# Patient Record
Sex: Female | Born: 1935 | Race: White | Hispanic: No | Smoking: Never smoker
Health system: Southern US, Community
[De-identification: ages and names within clinical notes are randomized; demographics above are authoritative.]

## PROBLEM LIST (undated history)

## (undated) DIAGNOSIS — H5440 Blindness, one eye, unspecified eye: Secondary | ICD-10-CM

## (undated) DIAGNOSIS — T4145XA Adverse effect of unspecified anesthetic, initial encounter: Secondary | ICD-10-CM

## (undated) DIAGNOSIS — D689 Coagulation defect, unspecified: Secondary | ICD-10-CM

## (undated) DIAGNOSIS — K5792 Diverticulitis of intestine, part unspecified, without perforation or abscess without bleeding: Secondary | ICD-10-CM

## (undated) DIAGNOSIS — C801 Malignant (primary) neoplasm, unspecified: Secondary | ICD-10-CM

## (undated) DIAGNOSIS — K4090 Unilateral inguinal hernia, without obstruction or gangrene, not specified as recurrent: Secondary | ICD-10-CM

## (undated) DIAGNOSIS — K219 Gastro-esophageal reflux disease without esophagitis: Secondary | ICD-10-CM

## (undated) DIAGNOSIS — T8859XA Other complications of anesthesia, initial encounter: Secondary | ICD-10-CM

## (undated) DIAGNOSIS — Z8719 Personal history of other diseases of the digestive system: Secondary | ICD-10-CM

## (undated) DIAGNOSIS — H269 Unspecified cataract: Secondary | ICD-10-CM

## (undated) DIAGNOSIS — N200 Calculus of kidney: Secondary | ICD-10-CM

## (undated) DIAGNOSIS — E039 Hypothyroidism, unspecified: Secondary | ICD-10-CM

## (undated) DIAGNOSIS — K8020 Calculus of gallbladder without cholecystitis without obstruction: Secondary | ICD-10-CM

## (undated) DIAGNOSIS — I10 Essential (primary) hypertension: Secondary | ICD-10-CM

## (undated) DIAGNOSIS — K5790 Diverticulosis of intestine, part unspecified, without perforation or abscess without bleeding: Secondary | ICD-10-CM

## (undated) DIAGNOSIS — M1990 Unspecified osteoarthritis, unspecified site: Secondary | ICD-10-CM

## (undated) DIAGNOSIS — Z9889 Other specified postprocedural states: Secondary | ICD-10-CM

## (undated) DIAGNOSIS — J45909 Unspecified asthma, uncomplicated: Secondary | ICD-10-CM

## (undated) DIAGNOSIS — D649 Anemia, unspecified: Secondary | ICD-10-CM

## (undated) HISTORY — DX: Calculus of gallbladder without cholecystitis without obstruction: K80.20

## (undated) HISTORY — PX: BREAST BIOPSY: SHX20

## (undated) HISTORY — DX: Diverticulosis of intestine, part unspecified, without perforation or abscess without bleeding: K57.90

## (undated) HISTORY — PX: COLONOSCOPY: SHX174

## (undated) HISTORY — DX: Personal history of other diseases of the digestive system: Z87.19

## (undated) HISTORY — PX: OTHER SURGICAL HISTORY: SHX169

## (undated) HISTORY — DX: Calculus of kidney: N20.0

## (undated) HISTORY — DX: Unilateral inguinal hernia, without obstruction or gangrene, not specified as recurrent: K40.90

## (undated) HISTORY — DX: Diverticulitis of intestine, part unspecified, without perforation or abscess without bleeding: K57.92

## (undated) HISTORY — PX: BUNIONECTOMY: SHX129

## (undated) HISTORY — DX: Other specified postprocedural states: Z98.890

## (undated) HISTORY — DX: Unspecified cataract: H26.9

## (undated) HISTORY — DX: Blindness, one eye, unspecified eye: H54.40

## (undated) HISTORY — PX: TRIGGER FINGER RELEASE: SHX641

## (undated) HISTORY — DX: Coagulation defect, unspecified: D68.9

---

## 1964-03-27 HISTORY — PX: ABDOMINAL HYSTERECTOMY: SHX81

## 1992-03-27 HISTORY — PX: EYE SURGERY: SHX253

## 1994-03-27 HISTORY — PX: CHOLECYSTECTOMY: SHX55

## 2006-03-27 HISTORY — PX: BLADDER SURGERY: SHX569

## 2015-01-26 HISTORY — PX: COLOSTOMY: SHX63

## 2015-01-26 HISTORY — PX: DIAGNOSTIC LAPAROSCOPY: SUR761

## 2015-02-02 ENCOUNTER — Emergency Department (HOSPITAL_COMMUNITY): Payer: Medicare Other

## 2015-02-02 ENCOUNTER — Inpatient Hospital Stay (HOSPITAL_COMMUNITY)
Admission: EM | Admit: 2015-02-02 | Discharge: 2015-02-10 | DRG: 853 | Disposition: A | Discharge status: Skilled Nursing Facility | Payer: Medicare Other | Attending: Internal Medicine | Admitting: Internal Medicine

## 2015-02-02 ENCOUNTER — Encounter (HOSPITAL_COMMUNITY): Payer: Self-pay | Admitting: Emergency Medicine

## 2015-02-02 DIAGNOSIS — R6521 Severe sepsis with septic shock: Secondary | ICD-10-CM | POA: Diagnosis present

## 2015-02-02 DIAGNOSIS — N8110 Cystocele, unspecified: Secondary | ICD-10-CM | POA: Diagnosis present

## 2015-02-02 DIAGNOSIS — Z90710 Acquired absence of both cervix and uterus: Secondary | ICD-10-CM

## 2015-02-02 DIAGNOSIS — Z6828 Body mass index (BMI) 28.0-28.9, adult: Secondary | ICD-10-CM

## 2015-02-02 DIAGNOSIS — J449 Chronic obstructive pulmonary disease, unspecified: Secondary | ICD-10-CM | POA: Diagnosis present

## 2015-02-02 DIAGNOSIS — R0600 Dyspnea, unspecified: Secondary | ICD-10-CM

## 2015-02-02 DIAGNOSIS — R188 Other ascites: Secondary | ICD-10-CM | POA: Diagnosis present

## 2015-02-02 DIAGNOSIS — K5720 Diverticulitis of large intestine with perforation and abscess without bleeding: Secondary | ICD-10-CM | POA: Diagnosis present

## 2015-02-02 DIAGNOSIS — A419 Sepsis, unspecified organism: Secondary | ICD-10-CM | POA: Diagnosis not present

## 2015-02-02 DIAGNOSIS — I10 Essential (primary) hypertension: Secondary | ICD-10-CM | POA: Diagnosis present

## 2015-02-02 DIAGNOSIS — E876 Hypokalemia: Secondary | ICD-10-CM | POA: Diagnosis present

## 2015-02-02 DIAGNOSIS — Z9049 Acquired absence of other specified parts of digestive tract: Secondary | ICD-10-CM

## 2015-02-02 DIAGNOSIS — Z09 Encounter for follow-up examination after completed treatment for conditions other than malignant neoplasm: Secondary | ICD-10-CM

## 2015-02-02 DIAGNOSIS — Z820 Family history of epilepsy and other diseases of the nervous system: Secondary | ICD-10-CM

## 2015-02-02 DIAGNOSIS — Z823 Family history of stroke: Secondary | ICD-10-CM

## 2015-02-02 DIAGNOSIS — Z4659 Encounter for fitting and adjustment of other gastrointestinal appliance and device: Secondary | ICD-10-CM

## 2015-02-02 DIAGNOSIS — E039 Hypothyroidism, unspecified: Secondary | ICD-10-CM | POA: Diagnosis present

## 2015-02-02 DIAGNOSIS — Z5331 Laparoscopic surgical procedure converted to open procedure: Secondary | ICD-10-CM

## 2015-02-02 DIAGNOSIS — N179 Acute kidney failure, unspecified: Secondary | ICD-10-CM | POA: Diagnosis present

## 2015-02-02 DIAGNOSIS — K659 Peritonitis, unspecified: Secondary | ICD-10-CM | POA: Diagnosis present

## 2015-02-02 DIAGNOSIS — R609 Edema, unspecified: Secondary | ICD-10-CM

## 2015-02-02 DIAGNOSIS — J4540 Moderate persistent asthma, uncomplicated: Secondary | ICD-10-CM | POA: Diagnosis present

## 2015-02-02 DIAGNOSIS — K631 Perforation of intestine (nontraumatic): Secondary | ICD-10-CM

## 2015-02-02 DIAGNOSIS — K449 Diaphragmatic hernia without obstruction or gangrene: Secondary | ICD-10-CM | POA: Diagnosis present

## 2015-02-02 DIAGNOSIS — Z85820 Personal history of malignant melanoma of skin: Secondary | ICD-10-CM

## 2015-02-02 DIAGNOSIS — Z79899 Other long term (current) drug therapy: Secondary | ICD-10-CM

## 2015-02-02 DIAGNOSIS — E861 Hypovolemia: Secondary | ICD-10-CM | POA: Diagnosis present

## 2015-02-02 DIAGNOSIS — Z8249 Family history of ischemic heart disease and other diseases of the circulatory system: Secondary | ICD-10-CM

## 2015-02-02 DIAGNOSIS — H540 Blindness, both eyes: Secondary | ICD-10-CM | POA: Diagnosis present

## 2015-02-02 DIAGNOSIS — R109 Unspecified abdominal pain: Secondary | ICD-10-CM | POA: Diagnosis not present

## 2015-02-02 DIAGNOSIS — D62 Acute posthemorrhagic anemia: Secondary | ICD-10-CM | POA: Diagnosis not present

## 2015-02-02 DIAGNOSIS — Z66 Do not resuscitate: Secondary | ICD-10-CM | POA: Diagnosis present

## 2015-02-02 HISTORY — DX: Malignant (primary) neoplasm, unspecified: C80.1

## 2015-02-02 HISTORY — DX: Essential (primary) hypertension: I10

## 2015-02-02 HISTORY — DX: Hypothyroidism, unspecified: E03.9

## 2015-02-02 HISTORY — DX: Unspecified asthma, uncomplicated: J45.909

## 2015-02-02 LAB — COMPREHENSIVE METABOLIC PANEL
ALT: 24 U/L (ref 14–54)
ANION GAP: 9 (ref 5–15)
AST: 18 U/L (ref 15–41)
Albumin: 3 g/dL — ABNORMAL LOW (ref 3.5–5.0)
Alkaline Phosphatase: 72 U/L (ref 38–126)
BILIRUBIN TOTAL: 1.3 mg/dL — AB (ref 0.3–1.2)
BUN: 30 mg/dL — AB (ref 6–20)
CO2: 26 mmol/L (ref 22–32)
Calcium: 9.3 mg/dL (ref 8.9–10.3)
Chloride: 101 mmol/L (ref 101–111)
Creatinine, Ser: 1.1 mg/dL — ABNORMAL HIGH (ref 0.44–1.00)
GFR calc Af Amer: 51 mL/min — ABNORMAL LOW (ref >60)
GFR, EST NON AFRICAN AMERICAN: 44 mL/min — AB (ref >60)
Glucose, Bld: 106 mg/dL — ABNORMAL HIGH (ref 65–99)
POTASSIUM: 4 mmol/L (ref 3.5–5.1)
Sodium: 136 mmol/L (ref 135–145)
TOTAL PROTEIN: 5.5 g/dL — AB (ref 6.5–8.1)

## 2015-02-02 LAB — CBC WITH DIFFERENTIAL/PLATELET
Basophils Absolute: 0 10*3/uL (ref 0.0–0.1)
Basophils Relative: 0 %
Eosinophils Absolute: 0 10*3/uL (ref 0.0–0.7)
Eosinophils Relative: 0 %
HEMATOCRIT: 39.4 % (ref 36.0–46.0)
Hemoglobin: 12.8 g/dL (ref 12.0–15.0)
LYMPHS ABS: 0.8 10*3/uL (ref 0.7–4.0)
LYMPHS PCT: 3 %
MCH: 30.5 pg (ref 26.0–34.0)
MCHC: 32.5 g/dL (ref 30.0–36.0)
MCV: 94 fL (ref 78.0–100.0)
MONO ABS: 0.7 10*3/uL (ref 0.1–1.0)
MONOS PCT: 3 %
NEUTROS ABS: 23 10*3/uL — AB (ref 1.7–7.7)
Neutrophils Relative %: 94 %
Platelets: 219 10*3/uL (ref 150–400)
RBC: 4.19 MIL/uL (ref 3.87–5.11)
RDW: 14.1 % (ref 11.5–15.5)
WBC: 24.5 10*3/uL — ABNORMAL HIGH (ref 4.0–10.5)

## 2015-02-02 LAB — TROPONIN I: Troponin I: <0.03 ng/mL (ref <0.031)

## 2015-02-02 LAB — I-STAT CG4 LACTIC ACID, ED: LACTIC ACID, VENOUS: 2.63 mmol/L — AB (ref 0.5–2.0)

## 2015-02-02 NOTE — ED Provider Notes (Signed)
CSN: [id number]     Arrival date & time 02/02/15  2055 History   First MD Initiated Contact with Patient 02/02/15 2128     Chief Complaint  Patient presents with  . Abdominal Pain   HPI  Ms. Mckoy is an 81 year old female with PMHx of HTN, hypothyroidism and asthma presenting with abdominal pain. She reports that her pain began yesterday evening. The pain came on acutely and has localized to her lower abdomen. She is unable to characterize the pain. She states that the pain comes and goes. Last evening's episode resolved on its own and she had another episode this morning that also resolved. She states this current episode began around mid afternoon and has not resolved like the others have. She states that she thinks she is constipated. She cannot remember her last bowel movement and she has not been able to pass gas. She has some associated mild nausea. She denies vomiting. She was recently put on a prednisone taper for bronchitis and is concerned this is making her constipated. She has not tried any over-the-counter laxatives or stool softeners. She states that Aleve relieved her pain for a short period of time but it quickly came back. Denies fevers, chills, dizziness, headaches, neck pain, chest pain, SOB, dysuria, flank pain or weakness in her extremities.   Past Medical History  Diagnosis Date  . Asthma   . Hypothyroidism   . Hypertension   . Cancer ([place])     Ocular melanoma   Past Surgical History  Procedure Laterality Date  . Eye surgery    . Cholecystectomy    . Foot surgery    . Bladder surgery    . Abdominal hysterectomy     Family History  Problem Relation Age of Onset  . Hypertension Mother   . Alzheimer's disease Mother   . Stroke Father    Social History  Substance Use Topics  . Smoking status: Never Smoker   . Smokeless tobacco: None  . Alcohol Use: No   OB History    No data available     Review of Systems  Constitutional: Positive for appetite change  (decrease). Negative for fever and chills.  HENT: Negative for congestion, rhinorrhea and sore throat.   Eyes: Negative for visual disturbance.  Respiratory: Negative for cough and shortness of breath.   Cardiovascular: Negative for chest pain and palpitations.  Gastrointestinal: Positive for nausea, abdominal pain and constipation. Negative for vomiting, blood in stool and abdominal distention.  Genitourinary: Negative for dysuria, hematuria and flank pain.  Musculoskeletal: Negative for back pain, arthralgias and neck pain.  Skin: Negative for rash.  Neurological: Negative for dizziness, syncope, weakness and headaches.  Hematological: Does not bruise/bleed easily.  All other systems reviewed and are negative.     Allergies  Penicillins and Tylenol with codeine #3  Home Medications   Prior to Admission medications   Medication Sig Start Date End Date Taking? Authorizing Provider  amLODipine (NORVASC) 5 MG tablet Take 5 mg by mouth daily.   Yes Historical Provider, MD  Fluticasone-Salmeterol (ADVAIR) 100-50 MCG/DOSE AEPB Inhale 1 puff into the lungs 2 (two) times daily.   Yes Historical Provider, MD  hydrochlorothiazide (MICROZIDE) 12.5 MG capsule Take 12.5 mg by mouth daily.   Yes Historical Provider, MD  Levothyroxine Sodium 25 MCG CAPS Take by mouth daily before breakfast.   Yes Historical Provider, MD  losartan (COZAAR) 100 MG tablet Take 100 mg by mouth daily.   Yes Historical Provider,  MD  naproxen sodium (ANAPROX) 220 MG tablet Take 220 mg by mouth 2 (two) times daily as needed (pain).   Yes Historical Provider, MD  Omega-3 Fatty Acids (FISH OIL PO) Take 1 capsule by mouth daily.   Yes Historical Provider, MD  predniSONE (DELTASONE) 10 MG tablet Take 10 mg by mouth 2 (two) times daily with a meal. For 15 days   Yes Historical Provider, MD   BP 113/76 mmHg  Pulse 78  Temp(Src) 99 F (37.2 C) (Oral)  Resp 25  Ht 5' (1.524 m)  Wt 149 lb 11.1 oz (67.9 kg)  BMI 29.23 kg/m2   SpO2 93% Physical Exam  Constitutional: She is oriented to person, place, and time. She appears well-developed and well-nourished. No distress.  HENT:  Head: Normocephalic and atraumatic.  Mouth/Throat: Oropharynx is clear and moist.  Eyes: Conjunctivae are normal. Pupils are equal, round, and reactive to light. Right eye exhibits no discharge. Left eye exhibits no discharge. No scleral icterus.  Neck: Normal range of motion.  Cardiovascular: Normal rate, regular rhythm, normal heart sounds and intact distal pulses.   Pulmonary/Chest: Effort normal and breath sounds normal. No respiratory distress. She has no wheezes. She has no rales.  Abdominal: Soft. She exhibits no distension. There is tenderness. There is guarding. There is no rebound.  Abdomen is soft with diffuse tenderness and guarding. Tenderness is more severe in her lower quadrants particularly over left side.   Musculoskeletal: Normal range of motion.  Moves all extremities spontaneously.   Neurological: She is alert and oriented to person, place, and time. Coordination normal.  Pt is alert and oriented. She interacts appropriately and is not altered. 5/5 strength in major muscle groups. Sensation to light touch intact.   Skin: Skin is warm and dry. She is not diaphoretic.  Psychiatric: She has a normal mood and affect. Her behavior is normal.  Nursing note and vitals reviewed.   ED Course  Procedures (including critical care time) Labs Review Labs Reviewed  CBC WITH DIFFERENTIAL/PLATELET - Abnormal; Notable for the following:    WBC 24.5 (*)    Neutro Abs 23.0 (*)    All other components within normal limits  COMPREHENSIVE METABOLIC PANEL - Abnormal; Notable for the following:    Glucose, Bld 106 (*)    BUN 30 (*)    Creatinine, Ser 1.10 (*)    Total Protein 5.5 (*)    Albumin 3.0 (*)    Total Bilirubin 1.3 (*)    GFR calc non Af Amer 44 (*)    GFR calc Af Amer 51 (*)    All other components within normal limits   URINALYSIS, ROUTINE W REFLEX MICROSCOPIC (NOT AT [facility]) - Abnormal; Notable for the following:    APPearance CLOUDY (*)    Hgb urine dipstick MODERATE (*)    All other components within normal limits  COMPREHENSIVE METABOLIC PANEL - Abnormal; Notable for the following:    Glucose, Bld 104 (*)    BUN 26 (*)    Calcium 7.4 (*)    Total Protein 4.3 (*)    Albumin 2.2 (*)    AST 90 (*)    ALT 65 (*)    Total Bilirubin 2.0 (*)    All other components within normal limits  CBC - Abnormal; Notable for the following:    WBC 22.3 (*)    RBC 3.44 (*)    Hemoglobin 10.5 (*)    HCT 32.9 (*)    All other components  within normal limits  URINE MICROSCOPIC-ADD ON - Abnormal; Notable for the following:    Bacteria, UA FEW (*)    All other components within normal limits  I-STAT CG4 LACTIC ACID, ED - Abnormal; Notable for the following:    Lactic Acid, Venous 2.63 (*)    All other components within normal limits  I-STAT CG4 LACTIC ACID, ED - Abnormal; Notable for the following:    Lactic Acid, Venous 2.19 (*)    All other components within normal limits  MRSA PCR SCREENING  CULTURE, BLOOD (ROUTINE X 2)  CULTURE, BLOOD (ROUTINE X 2)  URINE CULTURE  TROPONIN I  LACTIC ACID, PLASMA  LACTIC ACID, PLASMA  PROCALCITONIN  CALCIUM, IONIZED    Imaging Review Ct Abdomen Pelvis Wo Contrast  02/03/2015  CLINICAL DATA:  Abdominal pain EXAM: CT ABDOMEN AND PELVIS WITHOUT CONTRAST TECHNIQUE: Multidetector CT imaging of the abdomen and pelvis was performed following the standard protocol without IV contrast. COMPARISON:  None. FINDINGS: Lower chest and abdominal wall: Large hiatal hernia with gas fluid retention. Hepatobiliary: No focal liver abnormality.Cholecystectomy unremarkable common bile duct diameter. Pancreas: Unremarkable. Spleen: Unremarkable. Adrenals/Urinary Tract: Negative adrenals. No hydronephrosis or stone. Bladder descent from pelvic floor laxity. Reproductive:Pelvic floor laxity with  fluid density in the anterior perineum likely reflecting ascites when correlated with sagittal reformats. Hysterectomy. Negative adnexae. Stomach/Bowel: Moderate pneumoperitoneum distributed throughout the abdomen consistent with bowel perforation. The exact site of perforation is not identified, although a distal colonic diverticular source (extensive colonic diverticulosis) is favored given lack of oral contrast extravasation (which reaches the a proximal ascending colon), pelvic fat inflammatory changes, and ascitic fluid accumulation in the pelvis. No thickening of the stomach or duodenum. No bowel obstruction. Negative appendix. Pelvic ascites which is small volume but loculated in the bilateral and deep pelvis where collections measure 3 to 5 cm diameter. Vascular/Lymphatic: No acute vascular abnormality. No mass or adenopathy. Musculoskeletal: L4-5 facet arthropathy with grade 1 anterolisthesis. Critical Value/emergent results were called by telephone at the time of interpretation on 02/03/2015 at 1:37 am to Dr. Josephina Gip , who verbally acknowledged these results. IMPRESSION: 1. Bowel perforation with moderate pneumoperitoneum. Source is uncertain but perforated colonic diverticulum is favored. No extravasation of oral contrast which reaches the ascending colon. Small ascites with loculation in the pelvis. 2. Large hiatal hernia. Electronically Signed   By: Monte Fantasia M.D.   On: 02/03/2015 01:38   Dg Abd Acute W/chest  02/02/2015  CLINICAL DATA:  Abdominal pain EXAM: DG ABDOMEN ACUTE W/ 1V CHEST COMPARISON:  None. FINDINGS: Borderline cardiomegaly. There is a large hiatal hernia with gas fluid level. Negative aortic and hilar contours. There is no edema, consolidation, effusion, or pneumothorax. Nonobstructive small bowel and colonic gas pattern. Moderate stool burden with formed stool from the splenic flexure to the rectum. No concerning intra-abdominal mass effect or calcification.  Cholecystectomy changes. No pneumoperitoneum. IMPRESSION: 1. Large hiatal hernia with fluid level. 2. No evidence of acute cardiopulmonary disease. 3. Moderate stool volume. Electronically Signed   By: Monte Fantasia M.D.   On: 02/02/2015 22:59   I have personally reviewed and evaluated these images and lab results as part of my medical decision-making.   EKG Interpretation   Date/Time:  Tuesday February 02 2015 22:38:00 EST Ventricular Rate:  60 PR Interval:  132 QRS Duration: 98 QT Interval:  389 QTC Calculation: 389 R Axis:   90 Text Interpretation:  Sinus rhythm Borderline right axis deviation  Baseline wander in lead(s) II  III aVR aVF V1 V2 Confirmed by LITTLE MD,  RACHEL [phone]) on 02/02/2015 11:57:01 PM      MDM   Final diagnoses:  Peritonitis ([place])  Perforated bowel ([place])   Patient presenting with 1 day history of lower abdominal pain. She reports that it is cramping and intermittent. She states that this most recent episode began mid afternoon and has not resolved like past episodes. He states that the pain is gradually getting worse. Associated symptoms include nausea and possible constipation. Patient was hypotensive in the emergency department in the 80s over 78s. She was fluid resuscitated with 2 L bolus and is currently receiving a 125 cc infusion. Abdomen is diffusely tender with guarding. White count 25. Lactic acid 2.63. Creatinine 1.10 with no past results for comparison. CT abdomen shows bowel perforation with moderate pneumoperitoneum. Possible colonic diverticular perforation. Patient is currently hemodynamically stable. CODE SEPSIS  called. Discussed case with on-call surgeon (Dr. Hassell Done) who recommends admission, IV antibiotics and nothing by mouth. He will evaluate the patient in the morning. Discussed case with on-call critical care doctor who recommends hospitalist admission to stepdown bed. Discussed case with hospitalist, Dr. Loleta Books, patient will be admitted to  stepdown.    Lahoma Crocker Ariadne Rissmiller, PA-C 02/03/15 1220  Luetta Piazza, PA-C 02/03/15 [place], MD 02/03/15 2303

## 2015-02-02 NOTE — ED Notes (Signed)
Per EMS pt is a resident at [facility] started having lower abd pain yesterday that was cramping and intermittent  Pt states the pain went away  Pt states today the pain returned and has gotten progressively worse  Pt states the pain is worse when standing and better when she is in the supine position  Pt has tenderness upon palpation to the left side  Pt's last BM was yesterday  Pt denies any urinary sxs or blood in her stool  Pt was recently started on prednisone to treat her bronchitis

## 2015-02-03 ENCOUNTER — Encounter (HOSPITAL_COMMUNITY): Payer: Self-pay | Admitting: Family Medicine

## 2015-02-03 ENCOUNTER — Inpatient Hospital Stay (HOSPITAL_COMMUNITY): Payer: Medicare Other

## 2015-02-03 ENCOUNTER — Emergency Department (HOSPITAL_COMMUNITY): Payer: Medicare Other

## 2015-02-03 ENCOUNTER — Inpatient Hospital Stay (HOSPITAL_COMMUNITY): Payer: Medicare Other | Admitting: Anesthesiology

## 2015-02-03 ENCOUNTER — Encounter (HOSPITAL_COMMUNITY)
Admission: EM | Disposition: A | Discharge status: Skilled Nursing Facility | Payer: Self-pay | Source: Home / Self Care | Attending: Internal Medicine

## 2015-02-03 DIAGNOSIS — N8110 Cystocele, unspecified: Secondary | ICD-10-CM | POA: Diagnosis present

## 2015-02-03 DIAGNOSIS — E861 Hypovolemia: Secondary | ICD-10-CM | POA: Diagnosis present

## 2015-02-03 DIAGNOSIS — R188 Other ascites: Secondary | ICD-10-CM | POA: Diagnosis present

## 2015-02-03 DIAGNOSIS — E039 Hypothyroidism, unspecified: Secondary | ICD-10-CM | POA: Diagnosis not present

## 2015-02-03 DIAGNOSIS — R6521 Severe sepsis with septic shock: Secondary | ICD-10-CM

## 2015-02-03 DIAGNOSIS — Z8249 Family history of ischemic heart disease and other diseases of the circulatory system: Secondary | ICD-10-CM | POA: Diagnosis not present

## 2015-02-03 DIAGNOSIS — K659 Peritonitis, unspecified: Secondary | ICD-10-CM

## 2015-02-03 DIAGNOSIS — Z85820 Personal history of malignant melanoma of skin: Secondary | ICD-10-CM | POA: Diagnosis not present

## 2015-02-03 DIAGNOSIS — E876 Hypokalemia: Secondary | ICD-10-CM | POA: Diagnosis present

## 2015-02-03 DIAGNOSIS — J449 Chronic obstructive pulmonary disease, unspecified: Secondary | ICD-10-CM | POA: Diagnosis present

## 2015-02-03 DIAGNOSIS — H540 Blindness, both eyes: Secondary | ICD-10-CM | POA: Diagnosis present

## 2015-02-03 DIAGNOSIS — I10 Essential (primary) hypertension: Secondary | ICD-10-CM | POA: Diagnosis not present

## 2015-02-03 DIAGNOSIS — Z820 Family history of epilepsy and other diseases of the nervous system: Secondary | ICD-10-CM | POA: Diagnosis not present

## 2015-02-03 DIAGNOSIS — K631 Perforation of intestine (nontraumatic): Secondary | ICD-10-CM | POA: Diagnosis not present

## 2015-02-03 DIAGNOSIS — D62 Acute posthemorrhagic anemia: Secondary | ICD-10-CM | POA: Diagnosis not present

## 2015-02-03 DIAGNOSIS — Z9049 Acquired absence of other specified parts of digestive tract: Secondary | ICD-10-CM | POA: Diagnosis not present

## 2015-02-03 DIAGNOSIS — A419 Sepsis, unspecified organism: Principal | ICD-10-CM

## 2015-02-03 DIAGNOSIS — Z5331 Laparoscopic surgical procedure converted to open procedure: Secondary | ICD-10-CM | POA: Diagnosis not present

## 2015-02-03 DIAGNOSIS — J4540 Moderate persistent asthma, uncomplicated: Secondary | ICD-10-CM | POA: Diagnosis not present

## 2015-02-03 DIAGNOSIS — K449 Diaphragmatic hernia without obstruction or gangrene: Secondary | ICD-10-CM | POA: Diagnosis present

## 2015-02-03 DIAGNOSIS — R609 Edema, unspecified: Secondary | ICD-10-CM | POA: Diagnosis not present

## 2015-02-03 DIAGNOSIS — Z79899 Other long term (current) drug therapy: Secondary | ICD-10-CM | POA: Diagnosis not present

## 2015-02-03 DIAGNOSIS — K5720 Diverticulitis of large intestine with perforation and abscess without bleeding: Secondary | ICD-10-CM | POA: Diagnosis present

## 2015-02-03 DIAGNOSIS — Z6828 Body mass index (BMI) 28.0-28.9, adult: Secondary | ICD-10-CM | POA: Diagnosis not present

## 2015-02-03 DIAGNOSIS — N179 Acute kidney failure, unspecified: Secondary | ICD-10-CM | POA: Diagnosis present

## 2015-02-03 DIAGNOSIS — Z66 Do not resuscitate: Secondary | ICD-10-CM | POA: Diagnosis present

## 2015-02-03 DIAGNOSIS — Z823 Family history of stroke: Secondary | ICD-10-CM | POA: Diagnosis not present

## 2015-02-03 DIAGNOSIS — R109 Unspecified abdominal pain: Secondary | ICD-10-CM | POA: Diagnosis present

## 2015-02-03 DIAGNOSIS — Z90710 Acquired absence of both cervix and uterus: Secondary | ICD-10-CM | POA: Diagnosis not present

## 2015-02-03 DIAGNOSIS — E038 Other specified hypothyroidism: Secondary | ICD-10-CM | POA: Diagnosis not present

## 2015-02-03 HISTORY — PX: LAPAROSCOPY: SHX197

## 2015-02-03 LAB — COMPREHENSIVE METABOLIC PANEL
ALBUMIN: 2.2 g/dL — AB (ref 3.5–5.0)
ALK PHOS: 71 U/L (ref 38–126)
ALT: 65 U/L — AB (ref 14–54)
ANION GAP: 5 (ref 5–15)
AST: 90 U/L — ABNORMAL HIGH (ref 15–41)
BUN: 26 mg/dL — ABNORMAL HIGH (ref 6–20)
CHLORIDE: 110 mmol/L (ref 101–111)
CO2: 22 mmol/L (ref 22–32)
CREATININE: 0.81 mg/dL (ref 0.44–1.00)
Calcium: 7.4 mg/dL — ABNORMAL LOW (ref 8.9–10.3)
GFR calc Af Amer: >60 mL/min (ref >60)
GFR calc non Af Amer: >60 mL/min (ref >60)
GLUCOSE: 104 mg/dL — AB (ref 65–99)
Potassium: 3.7 mmol/L (ref 3.5–5.1)
SODIUM: 137 mmol/L (ref 135–145)
TOTAL PROTEIN: 4.3 g/dL — AB (ref 6.5–8.1)
Total Bilirubin: 2 mg/dL — ABNORMAL HIGH (ref 0.3–1.2)

## 2015-02-03 LAB — URINE MICROSCOPIC-ADD ON

## 2015-02-03 LAB — URINALYSIS, ROUTINE W REFLEX MICROSCOPIC
Bilirubin Urine: NEGATIVE
Glucose, UA: NEGATIVE mg/dL
Ketones, ur: NEGATIVE mg/dL
LEUKOCYTES UA: NEGATIVE
NITRITE: NEGATIVE
PH: 5 (ref 5.0–8.0)
Protein, ur: NEGATIVE mg/dL
SPECIFIC GRAVITY, URINE: 1.009 (ref 1.005–1.030)
Urobilinogen, UA: 1 mg/dL (ref 0.0–1.0)

## 2015-02-03 LAB — CBC WITH DIFFERENTIAL/PLATELET
BASOS ABS: 0 10*3/uL (ref 0.0–0.1)
BASOS PCT: 0 %
EOS ABS: 0 10*3/uL (ref 0.0–0.7)
Eosinophils Relative: 0 %
HCT: 37.3 % (ref 36.0–46.0)
HEMOGLOBIN: 11.9 g/dL — AB (ref 12.0–15.0)
LYMPHS ABS: 0.4 10*3/uL — AB (ref 0.7–4.0)
Lymphocytes Relative: 3 %
MCH: 30.4 pg (ref 26.0–34.0)
MCHC: 31.9 g/dL (ref 30.0–36.0)
MCV: 95.4 fL (ref 78.0–100.0)
Monocytes Absolute: 0.1 10*3/uL (ref 0.1–1.0)
Monocytes Relative: 1 %
NEUTROS ABS: 11.7 10*3/uL — AB (ref 1.7–7.7)
Neutrophils Relative %: 96 %
PLATELETS: 185 10*3/uL (ref 150–400)
RBC: 3.91 MIL/uL (ref 3.87–5.11)
RDW: 14.8 % (ref 11.5–15.5)
WBC Morphology: INCREASED
WBC: 12.2 10*3/uL — ABNORMAL HIGH (ref 4.0–10.5)

## 2015-02-03 LAB — LACTIC ACID, PLASMA
LACTIC ACID, VENOUS: 1.8 mmol/L (ref 0.5–2.0)
Lactic Acid, Venous: 1.6 mmol/L (ref 0.5–2.0)
Lactic Acid, Venous: 1.9 mmol/L (ref 0.5–2.0)

## 2015-02-03 LAB — MRSA PCR SCREENING: MRSA by PCR: NEGATIVE

## 2015-02-03 LAB — BASIC METABOLIC PANEL
Anion gap: 8 (ref 5–15)
BUN: 16 mg/dL (ref 6–20)
CALCIUM: 7.7 mg/dL — AB (ref 8.9–10.3)
CO2: 17 mmol/L — ABNORMAL LOW (ref 22–32)
CREATININE: 0.91 mg/dL (ref 0.44–1.00)
Chloride: 114 mmol/L — ABNORMAL HIGH (ref 101–111)
GFR calc Af Amer: >60 mL/min (ref >60)
GFR, EST NON AFRICAN AMERICAN: 56 mL/min — AB (ref >60)
GLUCOSE: 85 mg/dL (ref 65–99)
Potassium: 4.3 mmol/L (ref 3.5–5.1)
Sodium: 139 mmol/L (ref 135–145)

## 2015-02-03 LAB — CBC
HEMATOCRIT: 32.9 % — AB (ref 36.0–46.0)
Hemoglobin: 10.5 g/dL — ABNORMAL LOW (ref 12.0–15.0)
MCH: 30.5 pg (ref 26.0–34.0)
MCHC: 31.9 g/dL (ref 30.0–36.0)
MCV: 95.6 fL (ref 78.0–100.0)
PLATELETS: 188 10*3/uL (ref 150–400)
RBC: 3.44 MIL/uL — ABNORMAL LOW (ref 3.87–5.11)
RDW: 14.4 % (ref 11.5–15.5)
WBC: 22.3 10*3/uL — AB (ref 4.0–10.5)

## 2015-02-03 LAB — I-STAT CG4 LACTIC ACID, ED: Lactic Acid, Venous: 2.19 mmol/L (ref 0.5–2.0)

## 2015-02-03 LAB — TYPE AND SCREEN
ABO/RH(D): O POS
ANTIBODY SCREEN: NEGATIVE

## 2015-02-03 LAB — PROCALCITONIN: PROCALCITONIN: 3.14 ng/mL

## 2015-02-03 LAB — ABO/RH: ABO/RH(D): O POS

## 2015-02-03 SURGERY — LAPAROSCOPY, DIAGNOSTIC
Anesthesia: General | Site: Abdomen

## 2015-02-03 MED ORDER — VANCOMYCIN HCL IN DEXTROSE 1-5 GM/200ML-% IV SOLN
1000.0000 mg | Freq: Once | INTRAVENOUS | Status: AC
  Administered 2015-02-03: 1000 mg via INTRAVENOUS
  Filled 2015-02-03: qty 200

## 2015-02-03 MED ORDER — DEXTROSE 5 % IV SOLN: 2.0000 g | Freq: Three times a day (TID) | INTRAVENOUS | Status: DC

## 2015-02-03 MED ORDER — LEVOFLOXACIN IN D5W 750 MG/150ML IV SOLN
750.0000 mg | INTRAVENOUS | Status: DC
  Filled 2015-02-03: qty 150

## 2015-02-03 MED ORDER — METRONIDAZOLE IN NACL 5-0.79 MG/ML-% IV SOLN
500.0000 mg | Freq: Three times a day (TID) | INTRAVENOUS | Status: DC
  Administered 2015-02-03 (×2): 500 mg via INTRAVENOUS
  Filled 2015-02-03: qty 100

## 2015-02-03 MED ORDER — BUPIVACAINE-EPINEPHRINE (PF) 0.25% -1:200000 IJ SOLN
INTRAMUSCULAR | Status: AC
  Filled 2015-02-03: qty 30

## 2015-02-03 MED ORDER — PREDNISONE 10 MG PO TABS: 10.0000 mg | ORAL_TABLET | Freq: Two times a day (BID) | ORAL | Status: DC

## 2015-02-03 MED ORDER — LEVOFLOXACIN IN D5W 750 MG/150ML IV SOLN: 750.0000 mg | Freq: Once | INTRAVENOUS | Status: DC

## 2015-02-03 MED ORDER — SUCCINYLCHOLINE CHLORIDE 20 MG/ML IJ SOLN
INTRAMUSCULAR | Status: DC | PRN
  Administered 2015-02-03: 100 mg via INTRAVENOUS

## 2015-02-03 MED ORDER — SODIUM CHLORIDE 0.9 % IJ SOLN
3.0000 mL | Freq: Two times a day (BID) | INTRAMUSCULAR | Status: DC
  Administered 2015-02-03 – 2015-02-08 (×11): 3 mL via INTRAVENOUS

## 2015-02-03 MED ORDER — DEXAMETHASONE SODIUM PHOSPHATE 10 MG/ML IJ SOLN
INTRAMUSCULAR | Status: AC
  Filled 2015-02-03: qty 1

## 2015-02-03 MED ORDER — ROCURONIUM BROMIDE 100 MG/10ML IV SOLN
INTRAVENOUS | Status: DC | PRN
  Administered 2015-02-03: 20 mg via INTRAVENOUS
  Administered 2015-02-03: 10 mg via INTRAVENOUS

## 2015-02-03 MED ORDER — DEXTROSE 5 % IV SOLN
30.0000 ug/min | INTRAVENOUS | Status: DC
  Administered 2015-02-03: 30 ug/min via INTRAVENOUS
  Filled 2015-02-03 (×2): qty 1

## 2015-02-03 MED ORDER — HYDROCORTISONE NA SUCCINATE PF 100 MG IJ SOLR
50.0000 mg | Freq: Four times a day (QID) | INTRAMUSCULAR | Status: DC
  Administered 2015-02-04 (×2): 50 mg via INTRAVENOUS
  Filled 2015-02-03: qty 2
  Filled 2015-02-03 (×3): qty 1
  Filled 2015-02-03: qty 2
  Filled 2015-02-03 (×4): qty 1

## 2015-02-03 MED ORDER — BUPIVACAINE-EPINEPHRINE 0.25% -1:200000 IJ SOLN
INTRAMUSCULAR | Status: DC | PRN
  Administered 2015-02-03: 5 mL

## 2015-02-03 MED ORDER — LEVOTHYROXINE SODIUM 25 MCG PO TABS: 25.0000 ug | ORAL_TABLET | Freq: Every day | ORAL | Status: DC

## 2015-02-03 MED ORDER — SODIUM CHLORIDE 0.9 % IV BOLUS (SEPSIS)
1000.0000 mL | INTRAVENOUS | Status: AC
  Administered 2015-02-03 (×2): 1000 mL via INTRAVENOUS

## 2015-02-03 MED ORDER — PROPOFOL 10 MG/ML IV BOLUS
INTRAVENOUS | Status: DC | PRN
  Administered 2015-02-03: 90 mg via INTRAVENOUS

## 2015-02-03 MED ORDER — LIDOCAINE HCL (CARDIAC) 20 MG/ML IV SOLN
INTRAVENOUS | Status: AC
  Filled 2015-02-03: qty 5

## 2015-02-03 MED ORDER — HYDROCORTISONE NA SUCCINATE PF 100 MG IJ SOLR
50.0000 mg | Freq: Three times a day (TID) | INTRAMUSCULAR | Status: DC
  Administered 2015-02-03: 50 mg via INTRAVENOUS
  Filled 2015-02-03: qty 2

## 2015-02-03 MED ORDER — SUGAMMADEX SODIUM 200 MG/2ML IV SOLN
INTRAVENOUS | Status: AC
  Filled 2015-02-03: qty 2

## 2015-02-03 MED ORDER — LIDOCAINE HCL 1 % IJ SOLN
INTRAMUSCULAR | Status: AC
  Filled 2015-02-03: qty 20

## 2015-02-03 MED ORDER — FENTANYL CITRATE (PF) 250 MCG/5ML IJ SOLN
INTRAMUSCULAR | Status: AC
  Filled 2015-02-03: qty 25

## 2015-02-03 MED ORDER — FENTANYL CITRATE (PF) 100 MCG/2ML IJ SOLN
25.0000 ug | INTRAMUSCULAR | Status: DC | PRN
  Administered 2015-02-03 (×4): 25 ug via INTRAVENOUS

## 2015-02-03 MED ORDER — MORPHINE SULFATE (PF) 4 MG/ML IV SOLN
4.0000 mg | Freq: Once | INTRAVENOUS | Status: AC
  Administered 2015-02-03: 4 mg via INTRAVENOUS
  Filled 2015-02-03: qty 1

## 2015-02-03 MED ORDER — LACTATED RINGERS IV SOLN
INTRAVENOUS | Status: DC
  Administered 2015-02-03: 18:00:00 via INTRAVENOUS
  Administered 2015-02-03: 1000 mL via INTRAVENOUS

## 2015-02-03 MED ORDER — VANCOMYCIN HCL 500 MG IV SOLR
500.0000 mg | Freq: Two times a day (BID) | INTRAVENOUS | Status: DC
  Administered 2015-02-03 – 2015-02-05 (×4): 500 mg via INTRAVENOUS
  Filled 2015-02-03 (×6): qty 500

## 2015-02-03 MED ORDER — HYDROMORPHONE HCL 1 MG/ML IJ SOLN
INTRAMUSCULAR | Status: AC
  Filled 2015-02-03: qty 1

## 2015-02-03 MED ORDER — FENTANYL CITRATE (PF) 100 MCG/2ML IJ SOLN
INTRAMUSCULAR | Status: AC
  Filled 2015-02-03: qty 2

## 2015-02-03 MED ORDER — SODIUM CHLORIDE 0.9 % IV SOLN
500.0000 mg | Freq: Two times a day (BID) | INTRAVENOUS | Status: DC
  Administered 2015-02-03: 500 mg via INTRAVENOUS
  Filled 2015-02-03 (×2): qty 0.5

## 2015-02-03 MED ORDER — LACTATED RINGERS IR SOLN
Status: DC | PRN
  Administered 2015-02-03: 1000 mL

## 2015-02-03 MED ORDER — ARFORMOTEROL TARTRATE 15 MCG/2ML IN NEBU
15.0000 ug | INHALATION_SOLUTION | Freq: Two times a day (BID) | RESPIRATORY_TRACT | Status: DC
  Administered 2015-02-03 – 2015-02-10 (×15): 15 ug via RESPIRATORY_TRACT
  Filled 2015-02-03 (×16): qty 2

## 2015-02-03 MED ORDER — PHENYLEPHRINE 40 MCG/ML (10ML) SYRINGE FOR IV PUSH (FOR BLOOD PRESSURE SUPPORT)
PREFILLED_SYRINGE | INTRAVENOUS | Status: AC
  Filled 2015-02-03: qty 10

## 2015-02-03 MED ORDER — SODIUM CHLORIDE 0.9 % IV BOLUS (SEPSIS): 500.0000 mL | Freq: Once | INTRAVENOUS | Status: DC

## 2015-02-03 MED ORDER — FENTANYL CITRATE (PF) 100 MCG/2ML IJ SOLN
INTRAMUSCULAR | Status: DC | PRN
  Administered 2015-02-03 (×5): 50 ug via INTRAVENOUS

## 2015-02-03 MED ORDER — SODIUM CHLORIDE 0.9 % IV SOLN
1000.0000 mL | INTRAVENOUS | Status: DC
  Administered 2015-02-03: 17:00:00 via INTRAVENOUS
  Administered 2015-02-03: 1000 mL via INTRAVENOUS
  Administered 2015-02-03: 15:00:00 via INTRAVENOUS

## 2015-02-03 MED ORDER — PROPOFOL 10 MG/ML IV BOLUS
INTRAVENOUS | Status: AC
  Filled 2015-02-03: qty 20

## 2015-02-03 MED ORDER — SODIUM CHLORIDE 0.9 % IV SOLN: 1.0000 g | INTRAVENOUS | Status: DC

## 2015-02-03 MED ORDER — METRONIDAZOLE IN NACL 5-0.79 MG/ML-% IV SOLN
INTRAVENOUS | Status: AC
  Filled 2015-02-03: qty 100

## 2015-02-03 MED ORDER — LEVOFLOXACIN IN D5W 750 MG/150ML IV SOLN
750.0000 mg | INTRAVENOUS | Status: DC
Start: 2015-02-04 — End: 2015-02-03

## 2015-02-03 MED ORDER — ROCURONIUM BROMIDE 100 MG/10ML IV SOLN
INTRAVENOUS | Status: AC
  Filled 2015-02-03: qty 1

## 2015-02-03 MED ORDER — SUGAMMADEX SODIUM 200 MG/2ML IV SOLN
INTRAVENOUS | Status: DC | PRN
  Administered 2015-02-03: 150 mg via INTRAVENOUS

## 2015-02-03 MED ORDER — SODIUM CHLORIDE 0.9 % IV SOLN
100.0000 mg | INTRAVENOUS | Status: DC
  Administered 2015-02-04 – 2015-02-07 (×4): 100 mg via INTRAVENOUS
  Filled 2015-02-03 (×6): qty 100

## 2015-02-03 MED ORDER — LEVOFLOXACIN IN D5W 750 MG/150ML IV SOLN
750.0000 mg | Freq: Once | INTRAVENOUS | Status: AC
Start: 2015-02-03 — End: 2015-02-03
  Administered 2015-02-03: 750 mg via INTRAVENOUS
  Filled 2015-02-03: qty 150

## 2015-02-03 MED ORDER — SODIUM CHLORIDE 0.9 % IV SOLN
200.0000 mg | INTRAVENOUS | Status: AC
  Administered 2015-02-03: 200 mg via INTRAVENOUS
  Filled 2015-02-03: qty 200

## 2015-02-03 MED ORDER — ONDANSETRON HCL 4 MG/2ML IJ SOLN
INTRAMUSCULAR | Status: AC
  Filled 2015-02-03: qty 2

## 2015-02-03 MED ORDER — VANCOMYCIN HCL IN DEXTROSE 1-5 GM/200ML-% IV SOLN: 1000.0000 mg | INTRAVENOUS | Status: DC

## 2015-02-03 MED ORDER — ONDANSETRON HCL 4 MG/2ML IJ SOLN
4.0000 mg | Freq: Once | INTRAMUSCULAR | Status: AC
  Administered 2015-02-03: 4 mg via INTRAVENOUS
  Filled 2015-02-03: qty 2

## 2015-02-03 MED ORDER — SODIUM CHLORIDE 0.9 % IV SOLN
INTRAVENOUS | Status: DC
  Administered 2015-02-03 – 2015-02-05 (×4): via INTRAVENOUS

## 2015-02-03 MED ORDER — LIDOCAINE HCL (CARDIAC) 20 MG/ML IV SOLN
INTRAVENOUS | Status: DC | PRN
  Administered 2015-02-03: 50 mg via INTRAVENOUS

## 2015-02-03 MED ORDER — HYDROMORPHONE HCL 1 MG/ML IJ SOLN: 1.0000 mg | INTRAMUSCULAR | Status: DC | PRN

## 2015-02-03 MED ORDER — LEVOFLOXACIN IN D5W 750 MG/150ML IV SOLN: 750.0000 mg | INTRAVENOUS | Status: DC

## 2015-02-03 MED ORDER — BUDESONIDE 0.5 MG/2ML IN SUSP
0.5000 mg | Freq: Two times a day (BID) | RESPIRATORY_TRACT | Status: DC
  Administered 2015-02-03 – 2015-02-10 (×15): 0.5 mg via RESPIRATORY_TRACT
  Filled 2015-02-03 (×15): qty 2

## 2015-02-03 MED ORDER — DEXTROSE 5 % IV SOLN
2.0000 g | Freq: Once | INTRAVENOUS | Status: AC
  Administered 2015-02-03: 2 g via INTRAVENOUS
  Filled 2015-02-03: qty 2

## 2015-02-03 MED ORDER — FENTANYL CITRATE (PF) 100 MCG/2ML IJ SOLN
12.5000 ug | INTRAMUSCULAR | Status: DC | PRN
  Administered 2015-02-03: 50 ug via INTRAVENOUS
  Administered 2015-02-04: 25 ug via INTRAVENOUS
  Administered 2015-02-04 – 2015-02-05 (×3): 50 ug via INTRAVENOUS
  Administered 2015-02-08 (×3): 25 ug via INTRAVENOUS
  Filled 2015-02-03 (×8): qty 2

## 2015-02-03 MED ORDER — HYDROMORPHONE HCL 1 MG/ML IJ SOLN
0.2500 mg | INTRAMUSCULAR | Status: DC | PRN
  Administered 2015-02-03 (×2): 0.25 mg via INTRAVENOUS
  Administered 2015-02-03: 0.5 mg via INTRAVENOUS

## 2015-02-03 MED ORDER — LEVOTHYROXINE SODIUM 100 MCG IV SOLR
12.5000 ug | Freq: Every day | INTRAVENOUS | Status: DC
  Administered 2015-02-03 – 2015-02-06 (×4): 12.5 ug via INTRAVENOUS
  Filled 2015-02-03 (×4): qty 5

## 2015-02-03 MED ORDER — ONDANSETRON HCL 4 MG PO TABS: 4.0000 mg | ORAL_TABLET | Freq: Four times a day (QID) | ORAL | Status: DC | PRN

## 2015-02-03 MED ORDER — ONDANSETRON HCL 4 MG/2ML IJ SOLN: 4.0000 mg | Freq: Four times a day (QID) | INTRAMUSCULAR | Status: DC | PRN

## 2015-02-03 MED ORDER — 0.9 % SODIUM CHLORIDE (POUR BTL) OPTIME
TOPICAL | Status: DC | PRN
  Administered 2015-02-03: 10000 mL

## 2015-02-03 MED ORDER — HEPARIN SODIUM (PORCINE) 5000 UNIT/ML IJ SOLN
5000.0000 [IU] | Freq: Three times a day (TID) | INTRAMUSCULAR | Status: DC
  Administered 2015-02-03 – 2015-02-10 (×20): 5000 [IU] via SUBCUTANEOUS
  Filled 2015-02-03 (×23): qty 1

## 2015-02-03 MED ORDER — LIDOCAINE HCL 1 % IJ SOLN
INTRAMUSCULAR | Status: DC | PRN
  Administered 2015-02-03: 5 mL

## 2015-02-03 MED ORDER — PROMETHAZINE HCL 25 MG/ML IJ SOLN: 6.2500 mg | INTRAMUSCULAR | Status: DC | PRN

## 2015-02-03 MED ORDER — SODIUM CHLORIDE 0.9 % IV SOLN
1.0000 g | Freq: Once | INTRAVENOUS | Status: AC
  Administered 2015-02-03: 1 g via INTRAVENOUS
  Filled 2015-02-03: qty 10

## 2015-02-03 MED ORDER — SODIUM CHLORIDE 0.9 % IV SOLN
1.0000 g | Freq: Two times a day (BID) | INTRAVENOUS | Status: DC
  Administered 2015-02-03 – 2015-02-08 (×10): 1 g via INTRAVENOUS
  Filled 2015-02-03 (×15): qty 1

## 2015-02-03 MED ORDER — ONDANSETRON HCL 4 MG/2ML IJ SOLN
INTRAMUSCULAR | Status: DC | PRN
  Administered 2015-02-03: 4 mg via INTRAVENOUS

## 2015-02-03 MED ORDER — FENTANYL CITRATE (PF) 100 MCG/2ML IJ SOLN
25.0000 ug | INTRAMUSCULAR | Status: DC | PRN
Start: 2015-02-03 — End: 2015-02-03
  Administered 2015-02-03: 50 ug via INTRAVENOUS
  Filled 2015-02-03: qty 2

## 2015-02-03 SURGICAL SUPPLY — 71 items
BENZOIN TINCTURE PRP APPL 2/3 (GAUZE/BANDAGES/DRESSINGS) IMPLANT
BLADE HEX COATED 2.75 (ELECTRODE) ×2 IMPLANT
BLADE SURG 15 STRL LF DISP TIS (BLADE) ×1 IMPLANT
BLADE SURG 15 STRL SS (BLADE) ×1
BNDG GAUZE ELAST 4 BULKY (GAUZE/BANDAGES/DRESSINGS) ×2 IMPLANT
CLAMP POUCH DRAINAGE QUIET (OSTOMY) ×2 IMPLANT
COVER SURGICAL LIGHT HANDLE (MISCELLANEOUS) ×2 IMPLANT
DECANTER SPIKE VIAL GLASS SM (MISCELLANEOUS) ×2 IMPLANT
DRAPE LAPAROSCOPIC ABDOMINAL (DRAPES) IMPLANT
DRAPE TOWEL STR TPT 18X26 WHT (DRAPES) IMPLANT
DRSG TEGADERM 2-3/8X2-3/4 SM (GAUZE/BANDAGES/DRESSINGS) ×4 IMPLANT
ELECT NEEDLE EXT 1 X 6-1/2 (ELECTRODE) ×2 IMPLANT
ELECT PENCIL ROCKER SW 15FT (MISCELLANEOUS) ×2 IMPLANT
ELECT REM PT RETURN 9FT ADLT (ELECTROSURGICAL) ×2
ELECTRODE REM PT RTRN 9FT ADLT (ELECTROSURGICAL) ×1 IMPLANT
GAUZE SPONGE 2X2 8PLY STRL LF (GAUZE/BANDAGES/DRESSINGS) ×1 IMPLANT
GAUZE SPONGE 4X4 12PLY STRL (GAUZE/BANDAGES/DRESSINGS) ×2 IMPLANT
GLOVE BIO SURGEON STRL SZ 6 (GLOVE) ×2 IMPLANT
GLOVE BIO SURGEON STRL SZ7.5 (GLOVE) ×6 IMPLANT
GLOVE BIOGEL PI IND STRL 7.0 (GLOVE) ×1 IMPLANT
GLOVE BIOGEL PI IND STRL 7.5 (GLOVE) ×2 IMPLANT
GLOVE BIOGEL PI INDICATOR 7.0 (GLOVE) ×1
GLOVE BIOGEL PI INDICATOR 7.5 (GLOVE) ×2
GLOVE INDICATOR 6.5 STRL GRN (GLOVE) ×4 IMPLANT
GLOVE SURG SS PI 7.0 STRL IVOR (GLOVE) ×2 IMPLANT
GLOVE SURG SS PI 7.5 STRL IVOR (GLOVE) ×2 IMPLANT
GOWN STRL REIN 2XL LVL4 (GOWN DISPOSABLE) ×2 IMPLANT
GOWN STRL REUS W/ TWL XL LVL3 (GOWN DISPOSABLE) ×1 IMPLANT
GOWN STRL REUS W/TWL 2XL LVL3 (GOWN DISPOSABLE) ×2 IMPLANT
GOWN STRL REUS W/TWL XL LVL3 (GOWN DISPOSABLE) ×2
IV LACTATED RINGERS 1000ML (IV SOLUTION) ×2 IMPLANT
KIT BASIN OR (CUSTOM PROCEDURE TRAY) ×2 IMPLANT
LIGASURE IMPACT 36 18CM CVD LR (INSTRUMENTS) ×2 IMPLANT
LIQUID BAND (GAUZE/BANDAGES/DRESSINGS) IMPLANT
NS IRRIG 1000ML POUR BTL (IV SOLUTION) ×20 IMPLANT
POUCH OSTOMY 2  H (OSTOMY) ×2 IMPLANT
RELOAD PROXIMATE 75MM BLUE (ENDOMECHANICALS) ×2 IMPLANT
SET IRRIG TUBING LAPAROSCOPIC (IRRIGATION / IRRIGATOR) ×2 IMPLANT
SHEARS HARMONIC ACE PLUS 36CM (ENDOMECHANICALS) IMPLANT
SOL PREP POV-IOD 16OZ 10% (MISCELLANEOUS) ×2 IMPLANT
SOLUTION ANTI FOG 6CC (MISCELLANEOUS) IMPLANT
SPONGE GAUZE 2X2 STER 10/PKG (GAUZE/BANDAGES/DRESSINGS) ×1
SPONGE LAP 18X18 X RAY DECT (DISPOSABLE) ×4 IMPLANT
STAPLER CUT CVD 40MM BLUE (STAPLE) ×2 IMPLANT
STAPLER PROXIMATE 75MM BLUE (STAPLE) ×2 IMPLANT
STAPLER VISISTAT (STAPLE) ×2 IMPLANT
STRIP CLOSURE SKIN 1/2X4 (GAUZE/BANDAGES/DRESSINGS) IMPLANT
SUCTION POOLE TIP (SUCTIONS) ×2 IMPLANT
SUT PDS AB 1 TP1 96 (SUTURE) ×4 IMPLANT
SUT PROLENE 2 0 BLUE (SUTURE) ×2 IMPLANT
SUT SILK 2 0 (SUTURE) ×1
SUT SILK 2-0 18XBRD TIE 12 (SUTURE) ×1 IMPLANT
SUT SILK 3 0 (SUTURE) ×2
SUT SILK 3 0 SH CR/8 (SUTURE) ×2 IMPLANT
SUT SILK 3-0 18XBRD TIE 12 (SUTURE) ×1 IMPLANT
SUT VIC AB 3-0 SH 18 (SUTURE) ×4 IMPLANT
SUT VIC AB 3-0 SH 8-18 (SUTURE) ×2 IMPLANT
SUT VIC AB 4-0 PS2 27 (SUTURE) IMPLANT
TAPE CLOTH SURG 4X10 WHT LF (GAUZE/BANDAGES/DRESSINGS) ×2 IMPLANT
TOWEL OR 17X26 10 PK STRL BLUE (TOWEL DISPOSABLE) ×4 IMPLANT
TRAY FOLEY W/METER SILVER 14FR (SET/KITS/TRAYS/PACK) IMPLANT
TRAY FOLEY W/METER SILVER 16FR (SET/KITS/TRAYS/PACK) IMPLANT
TRAY LAPAROSCOPIC (CUSTOM PROCEDURE TRAY) ×2 IMPLANT
TROCAR BLADELESS OPT 5 75 (ENDOMECHANICALS) ×2 IMPLANT
TROCAR XCEL BLUNT TIP 100MML (ENDOMECHANICALS) ×2 IMPLANT
TROCAR XCEL NON-BLD 11X100MML (ENDOMECHANICALS) IMPLANT
TROCAR XCEL UNIV SLVE 11M 100M (ENDOMECHANICALS) IMPLANT
TUBE ANAEROBIC SPECIMEN COL (MISCELLANEOUS) ×4 IMPLANT
TUBING INSUFFLATION 10FT LAP (TUBING) IMPLANT
WATER STERILE IRR 1500ML POUR (IV SOLUTION) IMPLANT
YANKAUER SUCT BULB TIP NO VENT (SUCTIONS) ×2 IMPLANT

## 2015-02-03 NOTE — Consult Note (Signed)
Chief Complaint:  Abdominal pain  History of Present Illness:  Pamela Mejia is an 81 y.o. female who presented to the ER with abdominal pain with a past history of diverticulitis.  CT scan shows diverticulitis with perforation.  Pain presently controlled and resuscitation begun.    Past Medical History  Diagnosis Date  . Asthma   . Hypothyroidism   . Hypertension   . Cancer ([place])     Ocular melanoma    Past Surgical History  Procedure Laterality Date  . Eye surgery    . Cholecystectomy    . Foot surgery    . Bladder surgery    . Abdominal hysterectomy      Current Facility-Administered Medications  Medication Dose Route Frequency Provider Last Rate Last Dose  . 0.9 %  sodium chloride infusion  1,000 mL Intravenous Continuous Stevi Barrett, PA-C 125 mL/hr at 02/03/15 0136 1,000 mL at 02/03/15 0136  . aztreonam (AZACTAM) 2 g in dextrose 5 % 50 mL IVPB  2 g Intravenous Q8H Dorrene German, [facility]      . [START ON 02/04/2015] levofloxacin (LEVAQUIN) IVPB 750 mg  750 mg Intravenous Q48H Dorrene German, [facility]      . vancomycin (VANCOCIN) IVPB 1000 mg/200 mL premix  1,000 mg Intravenous Q24H Dorrene German, [facility]       Current Outpatient Prescriptions  Medication Sig Dispense Refill  . amLODipine (NORVASC) 5 MG tablet Take 5 mg by mouth daily.    . Fluticasone-Salmeterol (ADVAIR) 100-50 MCG/DOSE AEPB Inhale 1 puff into the lungs 2 (two) times daily.    . hydrochlorothiazide (MICROZIDE) 12.5 MG capsule Take 12.5 mg by mouth daily.    . Levothyroxine Sodium 25 MCG CAPS Take by mouth daily before breakfast.    . losartan (COZAAR) 100 MG tablet Take 100 mg by mouth daily.    . naproxen sodium (ANAPROX) 220 MG tablet Take 220 mg by mouth 2 (two) times daily as needed (pain).    . Omega-3 Fatty Acids (FISH OIL PO) Take 1 capsule by mouth daily.    . predniSONE (DELTASONE) 10 MG tablet Take 10 mg by mouth 2 (two) times daily with a meal. For 15 days     Penicillins and Tylenol with codeine  #3 Family History  Problem Relation Age of Onset  . Hypertension Mother   . Alzheimer's disease Mother   . Stroke Father    Social History:   reports that she has never smoked. She does not have any smokeless tobacco history on file. She reports that she does not drink alcohol or use illicit drugs.   REVIEW OF SYSTEMS : Negative except for see problem list;  History of diverticulitis  Physical Exam:   Blood pressure 88/47, pulse 73, temperature 98.8 F (37.1 C), temperature source Oral, resp. rate 34, height 5' (1.524 m), weight 65.318 kg (144 lb), SpO2 97 %. Body mass index is 28.12 kg/(m^2).  Gen:  WDWN WF NAD  Neurological: Alert and oriented to person, place, and time. Motor and sensory function is grossly intact  Head: Normocephalic and atraumatic.  Eyes: Conjunctivae are normal. Pupils are equal, round, and reactive to light. No scleral icterus.  Neck: Normal range of motion. Neck supple. No tracheal deviation or thyromegaly present.  Cardiovascular:  SR without murmurs or gallops.  No carotid bruits Breast:  Not examined Respiratory: Effort normal.  No respiratory distress. No chest wall tenderness. Breath sounds normal.  No wheezes, rales or rhonchi.  Abdomen:  Tender  in the lower quadrants to palpation GU:  Not examined Musculoskeletal: Normal range of motion. Extremities are nontender. No cyanosis, edema or clubbing noted Lymphadenopathy: No cervical, preauricular, postauricular or axillary adenopathy is present Skin: Skin is warm and dry. No rash noted. No diaphoresis. No erythema. No pallor. Pscyh: Normal mood and affect. Behavior is normal. Judgment and thought content normal.   LABORATORY RESULTS: Results for orders placed or performed during the hospital encounter of 02/02/15 (from the past 48 hour(s))  Troponin I     Status: None   Collection Time: 02/02/15 10:48 PM  Result Value Ref Range   Troponin I <0.03 <0.031 ng/mL    Comment:        NO INDICATION  OF MYOCARDIAL INJURY.   CBC with Differential     Status: Abnormal   Collection Time: 02/02/15 10:49 PM  Result Value Ref Range   WBC 24.5 (H) 4.0 - 10.5 K/uL   RBC 4.19 3.87 - 5.11 MIL/uL   Hemoglobin 12.8 12.0 - 15.0 g/dL   HCT 39.4 36.0 - 46.0 %   MCV 94.0 78.0 - 100.0 fL   MCH 30.5 26.0 - 34.0 pg   MCHC 32.5 30.0 - 36.0 g/dL   RDW 14.1 11.5 - 15.5 %   Platelets 219 150 - 400 K/uL   Neutrophils Relative % 94 %   Neutro Abs 23.0 (H) 1.7 - 7.7 K/uL   Lymphocytes Relative 3 %   Lymphs Abs 0.8 0.7 - 4.0 K/uL   Monocytes Relative 3 %   Monocytes Absolute 0.7 0.1 - 1.0 K/uL   Eosinophils Relative 0 %   Eosinophils Absolute 0.0 0.0 - 0.7 K/uL   Basophils Relative 0 %   Basophils Absolute 0.0 0.0 - 0.1 K/uL  Comprehensive metabolic panel     Status: Abnormal   Collection Time: 02/02/15 10:49 PM  Result Value Ref Range   Sodium 136 135 - 145 mmol/L   Potassium 4.0 3.5 - 5.1 mmol/L   Chloride 101 101 - 111 mmol/L   CO2 26 22 - 32 mmol/L   Glucose, Bld 106 (H) 65 - 99 mg/dL   BUN 30 (H) 6 - 20 mg/dL   Creatinine, Ser 1.10 (H) 0.44 - 1.00 mg/dL   Calcium 9.3 8.9 - 10.3 mg/dL   Total Protein 5.5 (L) 6.5 - 8.1 g/dL   Albumin 3.0 (L) 3.5 - 5.0 g/dL   AST 18 15 - 41 U/L   ALT 24 14 - 54 U/L   Alkaline Phosphatase 72 38 - 126 U/L   Total Bilirubin 1.3 (H) 0.3 - 1.2 mg/dL   GFR calc non Af Amer 44 (L) >60 mL/min   GFR calc Af Amer 51 (L) >60 mL/min    Comment: (NOTE) The eGFR has been calculated using the CKD EPI equation. This calculation has not been validated in all clinical situations. eGFR's persistently <60 mL/min signify possible Chronic Kidney Disease.    Anion gap 9 5 - 15  I-Stat CG4 Lactic Acid, ED     Status: Abnormal   Collection Time: 02/02/15 10:56 PM  Result Value Ref Range   Lactic Acid, Venous 2.63 (HH) 0.5 - 2.0 mmol/L   Comment NOTIFIED PHYSICIAN   I-Stat CG4 Lactic Acid, ED     Status: Abnormal   Collection Time: 02/03/15  2:39 AM  Result Value Ref  Range   Lactic Acid, Venous 2.19 (HH) 0.5 - 2.0 mmol/L   Comment NOTIFIED PHYSICIAN      RADIOLOGY RESULTS:  Ct Abdomen Pelvis Wo Contrast  02/03/2015  CLINICAL DATA:  Abdominal pain EXAM: CT ABDOMEN AND PELVIS WITHOUT CONTRAST TECHNIQUE: Multidetector CT imaging of the abdomen and pelvis was performed following the standard protocol without IV contrast. COMPARISON:  None. FINDINGS: Lower chest and abdominal wall: Large hiatal hernia with gas fluid retention. Hepatobiliary: No focal liver abnormality.Cholecystectomy unremarkable common bile duct diameter. Pancreas: Unremarkable. Spleen: Unremarkable. Adrenals/Urinary Tract: Negative adrenals. No hydronephrosis or stone. Bladder descent from pelvic floor laxity. Reproductive:Pelvic floor laxity with fluid density in the anterior perineum likely reflecting ascites when correlated with sagittal reformats. Hysterectomy. Negative adnexae. Stomach/Bowel: Moderate pneumoperitoneum distributed throughout the abdomen consistent with bowel perforation. The exact site of perforation is not identified, although a distal colonic diverticular source (extensive colonic diverticulosis) is favored given lack of oral contrast extravasation (which reaches the a proximal ascending colon), pelvic fat inflammatory changes, and ascitic fluid accumulation in the pelvis. No thickening of the stomach or duodenum. No bowel obstruction. Negative appendix. Pelvic ascites which is small volume but loculated in the bilateral and deep pelvis where collections measure 3 to 5 cm diameter. Vascular/Lymphatic: No acute vascular abnormality. No mass or adenopathy. Musculoskeletal: L4-5 facet arthropathy with grade 1 anterolisthesis. Critical Value/emergent results were called by telephone at the time of interpretation on 02/03/2015 at 1:37 am to Dr. Josephina Gip , who verbally acknowledged these results. IMPRESSION: 1. Bowel perforation with moderate pneumoperitoneum. Source is uncertain but  perforated colonic diverticulum is favored. No extravasation of oral contrast which reaches the ascending colon. Small ascites with loculation in the pelvis. 2. Large hiatal hernia. Electronically Signed   By: Monte Fantasia M.D.   On: 02/03/2015 01:38   Dg Abd Acute W/chest  02/02/2015  CLINICAL DATA:  Abdominal pain EXAM: DG ABDOMEN ACUTE W/ 1V CHEST COMPARISON:  None. FINDINGS: Borderline cardiomegaly. There is a large hiatal hernia with gas fluid level. Negative aortic and hilar contours. There is no edema, consolidation, effusion, or pneumothorax. Nonobstructive small bowel and colonic gas pattern. Moderate stool burden with formed stool from the splenic flexure to the rectum. No concerning intra-abdominal mass effect or calcification. Cholecystectomy changes. No pneumoperitoneum. IMPRESSION: 1. Large hiatal hernia with fluid level. 2. No evidence of acute cardiopulmonary disease. 3. Moderate stool volume. Electronically Signed   By: Monte Fantasia M.D.   On: 02/02/2015 22:59    Problem List: Patient Active Problem List   Diagnosis Date Noted  . Asthma, moderate persistent 02/03/2015  . Hypothyroidism 02/03/2015  . Essential hypertension 02/03/2015  . Peritonitis ([place]) 02/03/2015    Assessment & Plan: Pneumoperitoneum and diverticulitis.  Antibiotics and resuscitation.  If surgery necessary, will need colectomy and colostomy.      Matt B. Hassell Done, MD, [facility] [phone] beeper [phone]  02/03/2015 3:20 AM

## 2015-02-03 NOTE — H&P (Signed)
History and Physical  Patient Name: Pamela Mejia     QIH:[id number]    DOB: 08/29/1926    DOA: 02/02/2015 Referring physician: Josephina Gip, PA-C and Malachy Moan, MD PCP: Myriam Jacobson, MD      Chief Complaint: Abdominal pain  HPI: Pamela Mejia is a 81 y.o. female with a past medical history significant for HTN, asthma persistent, and hypothyroidism who presents with acute abdominal pain.  The patient was in her usual state of health until 24 hours ago when she developed "crampy" colicky lower abdominal pain.  She had an uncomfortable night, and this morning had decreased appetite and nausea, but was able to eat and be somewhat active.  Then tonight around 8p, the pain became severe, constant and moved to the left side so she came to the ER.  In the ED, the patietn had lactic acid 2.6 mmol/L, leukocytosis 24.5K/uL, and hypotensive and a CT of the abdomen and pelvis showed pneumoperitoneum with presumed perforated diverticulitis, location uncertain.  The renal function was normal.  General surgery were consulted who asked that the patient be treated medically with the hope for non-operative management.     Review of Systems:  All other systems negative except as just noted or noted in the history of present illness.   Allergies:  Allergies  Allergen Reactions  . Penicillins Shortness Of Breath    Patient is unable to answer PCN questions at this time.   . Tylenol With Codeine #3 [Acetaminophen-Codeine]     unknown    Home medications: 1. Amlodipine 5 mg daily 2. Hydrocodone thiazide 12.5 g daily 3. Losartan 100 mg daily 4. Advair 100-50 one puff twice daily 5. Levothyroxine 25 g daily 6. Prednisone for 15 days, to end on November 13 The patient recently completed ciprofloxacin for UTI  Past medical history: 1. Asthma 2. Hypothyroidism 3. Hypertension 4. Bladder prolapse 5. Ocular melanoma with blindness  Past surgical history: 1. Bladder suspension 2 2.  Cholecystectomy 3. Abdominal hysterectomy 4. Eye surgery  Family history:  Mother, Alzheimer's, hypertension. Father, stroke.   Social History:  Patient lives the patient lives in an independent living facility. She is independent with all ADLs. She is able to do housework like vacuuming around the house without difficulty.        Physical Exam: BP 88/47 mmHg  Pulse 73  Temp(Src) 98.8 F (37.1 C) (Oral)  Resp 34  Ht 5' (1.524 m)  Wt 65.318 kg (144 lb)  BMI 28.12 kg/m2  SpO2 97% General appearance: Well-developed elderly adult female, alert and in severe distress from pain.  Eyes closed.   Eyes: Anicteric, conjunctiva pink, lids and lashes normal.     ENT: No nasal deformity, discharge, or epistaxis.  OP moist without lesions.   Skin: Warm and dry, no mottling or cyanosis.  No jaundice. Cardiac: Tachycardic, regular, nl S1-S2, no significant murmurs  Capillary refill is brisk.  No LE edema.   Respiratory: Tachycpneic.  CTAB without rales or wheezes. Abdomen: Abdomen with involuntary guarding, no rigidity or rebound.  Marked TTP in LLQ, RLQ and LUQ. No ascites, distension.   Neuro: Sensorium intact and responding to questions, attention normal.  Speech is fluent.    Psych: Behavior appropriate.  Affect normal.  No evidence of aural or visual hallucinations or delusions.       Labs on Admission:  The metabolic panel shows elevated BUN-creatinine ratio. Normal renal function. Lactic acid level 2.6 mmol/L. TNI normal. Normal transaminases, slightly elevated total bilirubin.  The complete blood count shows WBC 24.5K/uL.  No anemia or thrombocytopenia.   Radiological Exams on Admission: Personally reviewed: Ct Abdomen Pelvis Wo Contrast 02/03/2015   IMPRESSION: 1. Bowel perforation with moderate pneumoperitoneum. Source is uncertain but perforated colonic diverticulum is favored. No extravasation of oral contrast which reaches the ascending colon. Small ascites with  loculation in the pelvis. 2. Large hiatal hernia.     EKG: Independently reviewed. Sinus without ST changes.    Assessment/Plan 1. Perforated diverticulum with hypotension:  This is new.    -Admit to Step-down -Consult to General Surgery, who hopes for non-operative mgmt -Levaquin and Flagyl IV -IVF fluids, 2 more liters now -Trend lactic acid -If BP remains low with next bolus, will start phenylephrine gtt, and titrate to MAP > 70 mmHg.  2. Bladder prolapse:  If the patient is unable to void on a bed pan, we will attempt foley placement, until post-operative period.  3. HTN:  Hypotensive at admission. Hold losartan, amlodipine, and HCTZ  4. Hypothyroidism:  Continue levothyroxine  5. Asthma:  Stable.  Can restart Advair when stable.     DVT PPx: SCDs Diet: NPO Consultants: General Surgery, PCCM Code Status: DNR  Medical decision making: What exists of the patient's previous chart was reviewed in depth and the case was discussed with [facility] PCCM MD and Dr. Betsey Holiday and Dr. Hassell Done. Patient seen 3:08 AM on 02/03/2015.  Disposition Plan:  Admit to step-down for monitoring and IV antibiotics.  Serial abdominal exams and Gen Surg following for possible resection/ex-lap.    Edwin Dada [facility] Pager [phone]

## 2015-02-03 NOTE — Progress Notes (Signed)
[facility] PCCM PROGRESS NOTE   Sepsis - Repeat Assessment  Performed at:   1900  Vitals     Blood pressure 95/50, pulse 62, temperature 98.6 F (37 C), temperature source Oral, resp. rate 20, height 5' (1.524 m), weight 67.9 kg (149 lb 11.1 oz), SpO2 95 %.  Heart:     Regular rate and rhythm  Lungs:    CTA  Capillary Refill:   <2 sec  Peripheral Pulse:   Radial pulse palpable  Skin:     Normal Color    A/P: Septic shock > no longer requiring pressors, however, suspect she may need again Peritonitis secondary to perforated diverticula s/p surgical repair now with open abdomen and new colostomy - Attempt to manage conservatively - Would be ok with CVL/pressors if conservative management fails - IVF resuscitation (2L in OR), 500cc bolus now - LR @ 125/hr - Repeat Bmet,  - Continue broad ABX and anti-fungals - Would endorse short term intubation if she acutely decompensates/fails extubation overnight (d/w son [name]), will order limited code for tonight, should re-evaluate in AM.  Georgann Housekeeper, ACNP [facility] Pulmonology/Critical Care Pager [phone] or [phone]

## 2015-02-03 NOTE — Progress Notes (Signed)
[facility] Progress Note Patient Name: Pamela Mejia DOB: 1926/12/30 MRN: [id number]   Date of Service  02/03/2015  HPI/Events of Note  D/w hospitalist earlier. Med Mgt od diveritculitis with perforation. Now arrivedin ICU   Recent Labs Lab 02/02/15 2256 02/03/15 0239  LATICACIDVEN 2.63* 2.19*    On cmera sbp 80s aler and appears to be in pain   eICU Interventions  2L fluid bolus started by  hospitalist Start neo Heading towards CCM consult      Intervention Category Intermediate Interventions: Hypotension - evaluation and management  [name] 02/03/2015, 4:15 AM

## 2015-02-03 NOTE — Progress Notes (Signed)
ANTIBIOTIC CONSULT NOTE - INITIAL  Pharmacy Consult for Azactam/Levaquin/Vancomycin Indication: Sepsis  Allergies  Allergen Reactions  . Penicillins Shortness Of Breath    Patient is unable to answer PCN questions at this time.   . Tylenol With Codeine #3 [Acetaminophen-Codeine]     unknown    Patient Measurements: Height: 5' (152.4 cm) Weight: 144 lb (65.318 kg) IBW/kg (Calculated) : 45.5   Vital Signs: Temp: 98.8 F (37.1 C) (11/08 2059) Temp Source: Oral (11/08 2059) BP: 83/46 mmHg (11/09 0015) Pulse Rate: 72 (11/09 0015) Intake/Output from previous day:   Intake/Output from this shift:    Labs:  Recent Labs  02/02/15 2249  WBC 24.5*  HGB 12.8  PLT 219  CREATININE 1.10*   Estimated Creatinine Clearance: 30.9 mL/min (by C-G formula based on Cr of 1.1). No results for input(s): VANCOTROUGH, VANCOPEAK, VANCORANDOM, GENTTROUGH, GENTPEAK, GENTRANDOM, TOBRATROUGH, TOBRAPEAK, TOBRARND, AMIKACINPEAK, AMIKACINTROU, AMIKACIN in the last 72 hours.   Microbiology: No results found for this or any previous visit (from the past 720 hour(s)).  Medical History: Past Medical History  Diagnosis Date  . Asthma   . Blindness   . Hypertension     Medications:   (Not in a hospital admission) Scheduled:   Infusions:  . sodium chloride    . aztreonam    . levofloxacin (LEVAQUIN) IV    . vancomycin     Assessment: Pamela Mejia c/o lower abdominal pain.  Azactam/Levaquin/Vancomycin per Rx for Sepsis.   Goal of Therapy:  Vancomycin trough level 15-20 mcg/ml  Plan:   Azactam 2Gm IV q8h  Levaquin 750mg  IV q48h  Vancomycin 1Gm IV q24h  F/u SCr/cultures/levels as needed  Lawana Pai R 02/03/2015,12:35 AM

## 2015-02-03 NOTE — Transfer of Care (Signed)
Immediate Anesthesia Transfer of Care Note  Patient: Pamela Mejia  Procedure(s) Performed: Procedure(s): DIAGNOSTIC LAPAROSCOPY/WASH OUT/OPEN SIGMOID BOWEL RESECTION/COLOSTOMY  (N/A)  Patient Location: PACU  Anesthesia Type:General  Level of Consciousness: awake, alert  and oriented  Airway & Oxygen Therapy: Patient Spontanous Breathing and Patient connected to face mask oxygen  Post-op Assessment: Report given to RN and Post -op Vital signs reviewed and stable  Post vital signs: Reviewed and stable  Last Vitals:  Filed Vitals:   02/03/15 1555  BP:   Pulse: 63  Temp:   Resp: 26    Complications: No apparent anesthesia complications

## 2015-02-03 NOTE — Progress Notes (Signed)
[facility] Progress Note     Subjective: Patient says her pain is improved compared to when she came in.  She c/o tenderness at the RLQ and periumbilical.  Seh says she feels quite bloated.  No N/V/D, fever/chills, CP.  Has some SOB, she says she just got over having bronchitis for 6 weeks and was on a prednisone taper.  Admits to anorexia.  Normally has infrequent BM's going every 2-3 days.  Passing flatus today, but no BM in a couple days.  She thought the pain was due excessive gas from eating hot dogs/baked beans yesterday.  Lives in independent living at [facility] living.    Objective: Vital signs in last 24 hours: Temp:  [98.8 F (37.1 C)] 98.8 F (37.1 C) (11/08 2059) Pulse Rate:  [58-90] 83 (11/09 0530) Resp:  [18-34] 25 (11/09 0530) BP: (74-146)/(38-95) 146/59 mmHg (11/09 0530) SpO2:  [91 %-99 %] 94 % (11/09 0530) Weight:  [65.318 kg (144 lb)-67.9 kg (149 lb 11.1 oz)] 67.9 kg (149 lb 11.1 oz) (11/09 0500)    Intake/Output from previous day: 11/08 0701 - 11/09 0700 In: 1040.5 [I.V.:73.8; IV Piggyback:966.7] Out: -  Intake/Output this shift:    PE: General: pleasant, WD/WN white female who is laying in bed in moderate discomfort HEENT: head is normocephalic, atraumatic.  Sclera are noninjected.  PERRL.  Ears and nose without any masses or lesions.  Mouth is pink and moist  Heart: regular, rate, and rhythm.  Normal s1,s2. ?murmur heard.  No gallops, or rubs noted.  Palpable radial and pedal pulses bilaterally Lungs: CTAB, no wheezes, rhonchi, or rales noted.  Respiratory effort mildly labored with  in place Abd: soft, distended, exquisitely tender in RLQ and periumbilical, +BS, no masses, hernias, or organomegaly, hysterectomy and lap chole scars noted. MS: all 4 extremities are symmetrical with no cyanosis, clubbing, or edema. Skin: warm and dry with no masses, lesions, or rashes Psych: A&Ox3 with an appropriate affect.   Lab Results:   Recent  Labs  02/02/15 2249 02/03/15 0500  WBC 24.5* 22.3*  HGB 12.8 10.5*  HCT 39.4 32.9*  PLT 219 188   BMET  Recent Labs  02/02/15 2249 02/03/15 0500  NA 136 137  K 4.0 3.7  CL 101 110  CO2 26 22  GLUCOSE 106* 104*  BUN 30* 26*  CREATININE 1.10* 0.81  CALCIUM 9.3 7.4*   PT/INR No results for input(s): LABPROT, INR in the last 72 hours. CMP     Component Value Date/Time   NA 137 02/03/2015 0500   K 3.7 02/03/2015 0500   CL 110 02/03/2015 0500   CO2 22 02/03/2015 0500   GLUCOSE 104* 02/03/2015 0500   BUN 26* 02/03/2015 0500   CREATININE 0.81 02/03/2015 0500   CALCIUM 7.4* 02/03/2015 0500   PROT 4.3* 02/03/2015 0500   ALBUMIN 2.2* 02/03/2015 0500   AST 90* 02/03/2015 0500   ALT 65* 02/03/2015 0500   ALKPHOS 71 02/03/2015 0500   BILITOT 2.0* 02/03/2015 0500   GFRNONAA >60 02/03/2015 0500   GFRAA >60 02/03/2015 0500   Lipase  No results found for: LIPASE     Studies/Results: Ct Abdomen Pelvis Wo Contrast  02/03/2015  CLINICAL DATA:  Abdominal pain EXAM: CT ABDOMEN AND PELVIS WITHOUT CONTRAST TECHNIQUE: Multidetector CT imaging of the abdomen and pelvis was performed following the standard protocol without IV contrast. COMPARISON:  None. FINDINGS: Lower chest and abdominal wall: Large hiatal hernia with gas fluid retention. Hepatobiliary: No focal liver  abnormality.Cholecystectomy unremarkable common bile duct diameter. Pancreas: Unremarkable. Spleen: Unremarkable. Adrenals/Urinary Tract: Negative adrenals. No hydronephrosis or stone. Bladder descent from pelvic floor laxity. Reproductive:Pelvic floor laxity with fluid density in the anterior perineum likely reflecting ascites when correlated with sagittal reformats. Hysterectomy. Negative adnexae. Stomach/Bowel: Moderate pneumoperitoneum distributed throughout the abdomen consistent with bowel perforation. The exact site of perforation is not identified, although a distal colonic diverticular source (extensive colonic  diverticulosis) is favored given lack of oral contrast extravasation (which reaches the a proximal ascending colon), pelvic fat inflammatory changes, and ascitic fluid accumulation in the pelvis. No thickening of the stomach or duodenum. No bowel obstruction. Negative appendix. Pelvic ascites which is small volume but loculated in the bilateral and deep pelvis where collections measure 3 to 5 cm diameter. Vascular/Lymphatic: No acute vascular abnormality. No mass or adenopathy. Musculoskeletal: L4-5 facet arthropathy with grade 1 anterolisthesis. Critical Value/emergent results were called by telephone at the time of interpretation on 02/03/2015 at 1:37 am to Dr. Josephina Gip , who verbally acknowledged these results. IMPRESSION: 1. Bowel perforation with moderate pneumoperitoneum. Source is uncertain but perforated colonic diverticulum is favored. No extravasation of oral contrast which reaches the ascending colon. Small ascites with loculation in the pelvis. 2. Large hiatal hernia. Electronically Signed   By: Monte Fantasia M.D.   On: 02/03/2015 01:38   Dg Abd Acute W/chest  02/02/2015  CLINICAL DATA:  Abdominal pain EXAM: DG ABDOMEN ACUTE W/ 1V CHEST COMPARISON:  None. FINDINGS: Borderline cardiomegaly. There is a large hiatal hernia with gas fluid level. Negative aortic and hilar contours. There is no edema, consolidation, effusion, or pneumothorax. Nonobstructive small bowel and colonic gas pattern. Moderate stool burden with formed stool from the splenic flexure to the rectum. No concerning intra-abdominal mass effect or calcification. Cholecystectomy changes. No pneumoperitoneum. IMPRESSION: 1. Large hiatal hernia with fluid level. 2. No evidence of acute cardiopulmonary disease. 3. Moderate stool volume. Electronically Signed   By: Monte Fantasia M.D.   On: 02/02/2015 22:59    Anti-infectives: Anti-infectives    Start     Dose/Rate Route Frequency Ordered Stop   02/04/15 2359  levofloxacin  (LEVAQUIN) IVPB 750 mg  Status:  Discontinued     750 mg 100 mL/hr over 90 Minutes Intravenous Every 48 hours 02/03/15 0149 02/03/15 0359   02/04/15 2359  levofloxacin (LEVAQUIN) IVPB 750 mg     750 mg 100 mL/hr over 90 Minutes Intravenous Every 48 hours 02/03/15 0404     02/03/15 2359  vancomycin (VANCOCIN) IVPB 1000 mg/200 mL premix  Status:  Discontinued     1,000 mg 200 mL/hr over 60 Minutes Intravenous Every 24 hours 02/03/15 0215 02/03/15 0359   02/03/15 1000  aztreonam (AZACTAM) 2 g in dextrose 5 % 50 mL IVPB  Status:  Discontinued     2 g 100 mL/hr over 30 Minutes Intravenous Every 8 hours 02/03/15 0149 02/03/15 0359   02/03/15 0415  metroNIDAZOLE (FLAGYL) IVPB 500 mg     500 mg 100 mL/hr over 60 Minutes Intravenous 3 times per day 02/03/15 0359     02/03/15 0400  levofloxacin (LEVAQUIN) IVPB 750 mg  Status:  Discontinued     750 mg 100 mL/hr over 90 Minutes Intravenous  Once 02/03/15 0359 02/03/15 0402   02/03/15 0030  levofloxacin (LEVAQUIN) IVPB 750 mg     750 mg 100 mL/hr over 90 Minutes Intravenous  Once 02/03/15 0022 02/03/15 0231   02/03/15 0030  aztreonam (AZACTAM) 2 g in dextrose 5 %  50 mL IVPB     2 g 100 mL/hr over 30 Minutes Intravenous  Once 02/03/15 0022 02/03/15 0131   02/03/15 0030  vancomycin (VANCOCIN) IVPB 1000 mg/200 mL premix     1,000 mg 200 mL/hr over 60 Minutes Intravenous  Once 02/03/15 0022 02/03/15 0229       Assessment/Plan Perforated diverticulitis Pneumoperitoneum  -Medical management was initiated upon her arrival in hopes to avoid surgery -NPO, IVF, pain control, antiemetics -IV antibiotics - Currently on Levaquin and flagyl, likely needs to be switched to Zosyn -She is not significantly improved from last night she still has at least localized peritonitis.  I don't know if conservative management is going to be sufficient.  She is agreeable to surgery if needed, but of course hesitant because of her age.   -Dr. Barry Dienes to see and evaluate  the patient shortly  Large Hiatal hernia H/o multiple abdominal surgeries - cholecystectomy, bladder surgery, abd hysterectomy DNR, lives at independent living H/o ocular melanoma HTN Hypothyroidism Asthma with recent bronchitis treated with prednisone taper     LOS: 0 days    Nat Christen 02/03/2015, 7:25 AM Pager: [phone]

## 2015-02-03 NOTE — Progress Notes (Signed)
ANTIBIOTIC CONSULT NOTE - Follow up  Pharmacy Consult for Anidulafungin, Meropenem, Vancomycin Indication: Intra-abdominal infection  Allergies  Allergen Reactions  . Penicillins Shortness Of Breath    Patient is unable to answer PCN questions at this time.   . Tylenol With Codeine #3 [Acetaminophen-Codeine]     unknown    Patient Measurements: Height: 5' (152.4 cm) Weight: 149 lb 11.1 oz (67.9 kg) IBW/kg (Calculated) : 45.5  Vital Signs: Temp: 99 F (37.2 C) (11/09 0800) Temp Source: Oral (11/09 0800) BP: 122/56 mmHg (11/09 1545) Pulse Rate: 63 (11/09 1555) Intake/Output from previous day: 11/08 0701 - 11/09 0700 In: 1480.5 [I.V.:413.8; IV Piggyback:1066.7] Out: -  Intake/Output from this shift: Total I/O In: 2900 [I.V.:2850; IV Piggyback:50] Out: 500 [Urine:300; Blood:200]  Labs:  Recent Labs  02/02/15 2249 02/03/15 0500  WBC 24.5* 22.3*  HGB 12.8 10.5*  PLT 219 188  CREATININE 1.10* 0.81   Estimated Creatinine Clearance: 42.9 mL/min (by C-G formula based on Cr of 0.81). No results for input(s): VANCOTROUGH, VANCOPEAK, VANCORANDOM, GENTTROUGH, GENTPEAK, GENTRANDOM, TOBRATROUGH, TOBRAPEAK, TOBRARND, AMIKACINPEAK, AMIKACINTROU, AMIKACIN in the last 72 hours.   Microbiology: Recent Results (from the past 720 hour(s))  MRSA PCR Screening     Status: None   Collection Time: 02/03/15  4:17 AM  Result Value Ref Range Status   MRSA by PCR NEGATIVE NEGATIVE Final    Comment:        The GeneXpert MRSA Assay (FDA approved for NASAL specimens only), is one component of a comprehensive MRSA colonization surveillance program. It is not intended to diagnose MRSA infection nor to guide or monitor treatment for MRSA infections.     Medical History: Past Medical History  Diagnosis Date  . Asthma   . Hypothyroidism   . Hypertension   . Cancer ([place])     Ocular melanoma    Assessment: Pamela Mejia admitted on 11/9 with abdominal pain; CT abdomen/pelvis showed  pneumoperitoneum with presumed perforated diverticulitis.  PMH is significant for diverticulitis and multiple abdominal surgeries.  She is now s/p lap sigmoid colectomy with takedown of splenic flexure and end colostomy.  She was initially started on broad spectrum antibiotics, but now anti-infectives are changed to Meropenem, Anidulafungin, and Vancomycin per pharmacy dosing.  11/9 >> Vancomycin >> 11/9 >> Aztreonam >> 11/9 11/9 >> Levaquin >> 11/9 11/9 >> Metronidazole >> 11/9 11/9 >> Meropenem >> 11/9 >> Anidulafungin >>  Today, 02/03/2015:  Tmax: 99  WBC 22.3  SCr 0.81 with CrCl ~ 43 ml/min  Blood, urine, and peritoneal fluid cultures pending.   Goal of Therapy:  Vancomycin trough level 15-20 mcg/ml Appropriate abx dosing, eradication of infection.   Plan:   Meropenem 1g IV q12h  Vancomycin 500 mg IV q12h  Anidulafungin 200mg  IV once, then 100mg  IV q24h  Follow up renal fxn, culture results, and clinical course.  Gretta Arab PharmD, BCPS Pager [phone] 02/03/2015 6:28 PM

## 2015-02-03 NOTE — Consult Note (Signed)
PULMONARY / CRITICAL CARE MEDICINE   Name: Pamela Mejia MRN: [id number] DOB: [birth year]    ADMISSION DATE:  02/02/2015 CONSULTATION DATE:  02/03/15  REFERRING MD :  Loleta Books  CHIEF COMPLAINT:  Abd pain  INITIAL PRESENTATION:  81 y.o. F from independent living, brought to [facility] ED 11/8 for abd pain.  Found to have bowel perf with pneumoperitoneum.  Admitted by [facility] but developed hypotension; therefore, PCCM called in consultation.   STUDIES:  CT A/P 11/8 >>> bowel perf with mod pneumoperitoneum, suspected due to perforated colonic diverticulum.  Large hiatal hernia.  SIGNIFICANT EVENTS: 11/8 - admitted with bowel perf.   HISTORY OF PRESENT ILLNESS:  Pamela Mejia is a 81 y.o. female with a PMH as outlined below ane who resides at [employer] (indepedent living).  She presented to [facility] ED 11/8 with abdominal pain that had began one day prior.  Pain was described as "crapmy" and was worse standing and better lying down.  She had not had any fevers/chills/sweats, chest pain, SOB, N/V/D, myalgias.   In ED, CT revealed bowel perf with moderate pneumoperitoneum, suspected due to perforated colonic diverticulum.  She was evaluated by [facility] who opted to try medical management.  She was admitted by [facility] and PCCM was consulted for hypotension.   PAST MEDICAL HISTORY :   has a past medical history of Asthma; Hypothyroidism; Hypertension; and Cancer ([place]).  has past surgical history that includes Eye surgery; Cholecystectomy; Foot surgery; Bladder surgery; and Abdominal hysterectomy. Prior to Admission medications   Medication Sig Start Date End Date Taking? Authorizing Provider  amLODipine (NORVASC) 5 MG tablet Take 5 mg by mouth daily.   Yes Historical Provider, MD  Fluticasone-Salmeterol (ADVAIR) 100-50 MCG/DOSE AEPB Inhale 1 puff into the lungs 2 (two) times daily.   Yes Historical Provider, MD  hydrochlorothiazide (MICROZIDE) 12.5 MG capsule Take 12.5 mg by mouth daily.   Yes Historical  Provider, MD  Levothyroxine Sodium 25 MCG CAPS Take by mouth daily before breakfast.   Yes Historical Provider, MD  losartan (COZAAR) 100 MG tablet Take 100 mg by mouth daily.   Yes Historical Provider, MD  naproxen sodium (ANAPROX) 220 MG tablet Take 220 mg by mouth 2 (two) times daily as needed (pain).   Yes Historical Provider, MD  Omega-3 Fatty Acids (FISH OIL PO) Take 1 capsule by mouth daily.   Yes Historical Provider, MD  predniSONE (DELTASONE) 10 MG tablet Take 10 mg by mouth 2 (two) times daily with a meal. For 15 days   Yes Historical Provider, MD   Allergies  Allergen Reactions  . Penicillins Shortness Of Breath    Patient is unable to answer PCN questions at this time.   . Tylenol With Codeine #3 [Acetaminophen-Codeine]     unknown    FAMILY HISTORY:  Family History  Problem Relation Age of Onset  . Hypertension Mother   . Alzheimer's disease Mother   . Stroke Father     SOCIAL HISTORY:  reports that she has never smoked. She does not have any smokeless tobacco history on file. She reports that she does not drink alcohol or use illicit drugs.  REVIEW OF SYSTEMS:  All negative; except for those that are bolded, which indicate positives.  Constitutional: weight loss, weight gain, night sweats, fevers, chills, fatigue, weakness.  HEENT: headaches, sore throat, sneezing, nasal congestion, post nasal drip, difficulty swallowing, tooth/dental problems, visual complaints, visual changes, ear aches. Neuro: difficulty with speech, weakness, numbness, ataxia. CV:  chest pain, orthopnea,  PND, swelling in lower extremities, dizziness, palpitations, syncope.  Resp: cough, hemoptysis, dyspnea, wheezing. GI  heartburn, indigestion, abdominal pain, nausea, vomiting, diarrhea, constipation, change in bowel habits, loss of appetite, hematemesis, melena, hematochezia.  GU: dysuria, change in color of urine, urgency or frequency, flank pain, hematuria. MSK: joint pain or swelling,  decreased range of motion. Psych: change in mood or affect, depression, anxiety, suicidal ideations, homicidal ideations. Skin: rash, itching, bruising.   SUBJECTIVE: Reports abd pain is somewhat better compared to earlier, but still having pain in epigastrium.  Denies chest pain, SOB, N/V.  VITAL SIGNS: Temp:  [98.8 F (37.1 C)] 98.8 F (37.1 C) (11/08 2059) Pulse Rate:  [58-90] 76 (11/09 0500) Resp:  [18-34] 33 (11/09 0500) BP: (74-125)/(38-95) 117/50 mmHg (11/09 0500) SpO2:  [91 %-99 %] 97 % (11/09 0500) Weight:  [65.318 kg (144 lb)] 65.318 kg (144 lb) (11/08 2059) HEMODYNAMICS:   VENTILATOR SETTINGS:   INTAKE / OUTPUT: Intake/Output    None     PHYSICAL EXAMINATION: General: Elderly female, in NAD. Neuro: A&O x 3, non-focal.  HEENT: [place]/AT. PERRL, sclerae anicteric. Cardiovascular: RRR, no M/R/G.  Lungs: Respirations even and unlabored.  CTA bilaterally, No W/R/R. Abdomen: BS hypoactive.  Abd soft, tender throughout with + guarding and rebound. Musculoskeletal: No gross deformities, no edema.  Skin: Intact, warm, no rashes.  LABS:  CBC  Recent Labs Lab 02/02/15 2249 02/03/15 0500  WBC 24.5* 22.3*  HGB 12.8 10.5*  HCT 39.4 32.9*  PLT 219 188   Coag's No results for input(s): APTT, INR in the last 168 hours. BMET  Recent Labs Lab 02/02/15 2249  NA 136  K 4.0  CL 101  CO2 26  BUN 30*  CREATININE 1.10*  GLUCOSE 106*   Electrolytes  Recent Labs Lab 02/02/15 2249  CALCIUM 9.3   Sepsis Markers  Recent Labs Lab 02/02/15 2256 02/03/15 0239  LATICACIDVEN 2.63* 2.19*   ABG No results for input(s): PHART, PCO2ART, PO2ART in the last 168 hours. Liver Enzymes  Recent Labs Lab 02/02/15 2249  AST 18  ALT 24  ALKPHOS 72  BILITOT 1.3*  ALBUMIN 3.0*   Cardiac Enzymes  Recent Labs Lab 02/02/15 2248  TROPONINI <0.03   Glucose No results for input(s): GLUCAP in the last 168 hours.  Imaging Ct Abdomen Pelvis Wo Contrast  02/03/2015   CLINICAL DATA:  Abdominal pain EXAM: CT ABDOMEN AND PELVIS WITHOUT CONTRAST TECHNIQUE: Multidetector CT imaging of the abdomen and pelvis was performed following the standard protocol without IV contrast. COMPARISON:  None. FINDINGS: Lower chest and abdominal wall: Large hiatal hernia with gas fluid retention. Hepatobiliary: No focal liver abnormality.Cholecystectomy unremarkable common bile duct diameter. Pancreas: Unremarkable. Spleen: Unremarkable. Adrenals/Urinary Tract: Negative adrenals. No hydronephrosis or stone. Bladder descent from pelvic floor laxity. Reproductive:Pelvic floor laxity with fluid density in the anterior perineum likely reflecting ascites when correlated with sagittal reformats. Hysterectomy. Negative adnexae. Stomach/Bowel: Moderate pneumoperitoneum distributed throughout the abdomen consistent with bowel perforation. The exact site of perforation is not identified, although a distal colonic diverticular source (extensive colonic diverticulosis) is favored given lack of oral contrast extravasation (which reaches the a proximal ascending colon), pelvic fat inflammatory changes, and ascitic fluid accumulation in the pelvis. No thickening of the stomach or duodenum. No bowel obstruction. Negative appendix. Pelvic ascites which is small volume but loculated in the bilateral and deep pelvis where collections measure 3 to 5 cm diameter. Vascular/Lymphatic: No acute vascular abnormality. No mass or adenopathy. Musculoskeletal: L4-5 facet arthropathy with  grade 1 anterolisthesis. Critical Value/emergent results were called by telephone at the time of interpretation on 02/03/2015 at 1:37 am to Dr. Josephina Gip , who verbally acknowledged these results. IMPRESSION: 1. Bowel perforation with moderate pneumoperitoneum. Source is uncertain but perforated colonic diverticulum is favored. No extravasation of oral contrast which reaches the ascending colon. Small ascites with loculation in the pelvis. 2.  Large hiatal hernia. Electronically Signed   By: Monte Fantasia M.D.   On: 02/03/2015 01:38   Dg Abd Acute W/chest  02/02/2015  CLINICAL DATA:  Abdominal pain EXAM: DG ABDOMEN ACUTE W/ 1V CHEST COMPARISON:  None. FINDINGS: Borderline cardiomegaly. There is a large hiatal hernia with gas fluid level. Negative aortic and hilar contours. There is no edema, consolidation, effusion, or pneumothorax. Nonobstructive small bowel and colonic gas pattern. Moderate stool burden with formed stool from the splenic flexure to the rectum. No concerning intra-abdominal mass effect or calcification. Cholecystectomy changes. No pneumoperitoneum. IMPRESSION: 1. Large hiatal hernia with fluid level. 2. No evidence of acute cardiopulmonary disease. 3. Moderate stool volume. Electronically Signed   By: Monte Fantasia M.D.   On: 02/02/2015 22:59   ASSESSMENT / PLAN:   GASTROINTESTINAL A:   Bowel perforation with moderate pneumoperitoneum. Transaminitis. Nutrition. P:   CCS following, hoping for medical management. Suspect will ultimately require surgical intervention. LFT's in AM. NPO.  INFECTIOUS A:   Peritonitis due to bowel perforation. P:   BCx2 11/8 > UCx 11/8 > Abx: Levaquin, start date 11/9, day 1/x. Abx: Flagyl, start date 11/9, day 1/x. PCT algorithm to limit abx exposure.  PULMONARY A: At risk atelectasis due to splinting. Hx Asthma. P:   Incentive spirometry. Pulmonary hygiene. Budesonide / Brovana in lieu of outpatient Advair. CXR intermittently.  CARDIOVASCULAR A:  Hypotension - suspect hypovolemia from decreased PO intake + early septic shock due to peritonitis from perforated bowel. Hx HTN. DNR STATUS. P:  Continue neosynephrine as needed to maintain goal MAP > 65. May require CVL if higher doses of pressors needed (currently only on 49mcg with excellent response in BP; therefore, line deferred for now). Trend lactate. Hold outpatient amlodipine, hydrochlorothiazide,  losartan.  RENAL A:   AKI - likely pre-renal from decreased PO intake. Hypocalcemia - corrects to 8.8. P:   NS @ 125. 1g Ca gluconate. Assess ionized calcium. BMP in AM.  HEMATOLOGIC A:   Mild anemia. VTE Prophylaxis. P:  Transfuse for Hgb < 7. SCD's / Heparin. CBC in AM.  ENDOCRINE A:   Hypothyroidism.   P:   Continue outpatient synthroid, change to IV formulation.  NEUROLOGIC A:   Pain secondary to bowel perforation. P:   Fentanyl PRN. Dilaudid d/c'd.   Family updated: None.  Interdisciplinary Family Meeting v Palliative Care Meeting:  Due by: 11/15.   Montey Hora, [place] [facility] Pager: [phone]  or [phone] 02/03/2015, 5:25 AM

## 2015-02-03 NOTE — Consult Note (Signed)
WOC ostomy consult note Consulted for stat ostomy marking.  Ileostomy vs colostomy for perforated diverticulum.  Conservative measures have not been successful.  Patient and son at bedside and are signing surgical consents.  Both understand that patient may come out of surgery and have an ostomy.  Briefly explained the difference between colostomy and ileostomy.  Explained that today we would be marking for both.  Both express understanding as MD has discussed this with them.  Explained that if an ostomy is placed, the [place] team will continue to follow and provide ongoing assistance and teaching.   Patient is assessed lying, sitting and standing.  Skin is cleansed with HCG wipes on each side of umbilicus and sites are marked, within the rectus muscle.  2 cm left and 2 cm below umbilicus as well as 2 cm right and 2.2 cm below umbilicus.  Site is not covered with transparent film as she is going to surgery soon.  Educational booklet is provided and indicated that postoperative teaching would continue as needed.  Patient is quite uncomfortable in RLQ today.  [facility] team will continue to follow and remain available to patient, medical and nursing teams.  Domenic Moras RN BSN [place] Pager [phone]

## 2015-02-03 NOTE — Progress Notes (Signed)
Rx Brief Antibiotic note:  Levaquin/Flagyl  See 11/9 note by E Lindie Spruce D for full details  Assessement:  Abx changed to Levaquin and Flagyl for intra-abdominal infection  Plan:  Levaquin 750mg  IV q48h  Flagyl 500mg  IV q8h  F/u SCr/cultures as needed  Dorrene German 02/03/2015 4:08 AM

## 2015-02-03 NOTE — ED Provider Notes (Signed)
Patient presented to the ER with complaints of abdominal pain. Patient reports pain began one day ago. She is complaining of worsening, severe, diffuse and constant abdominal pain  Face to face Exam: HEENT - PERRLA Lungs - CTAB Heart - RRR, no M/R/G Abd - diffusely tender, guarding, bowel sounds quiet Neuro - alert, oriented x3  Plan: Workup reveals pneumoperitoneum, likely from diverticular rupture. Patient exhibiting vital signs concerning for sepsis. Initiated on IV fluid resuscitation and antibiotic coverage. Dr. Hassell Done, general surgery consulted. Critical care consulted, recommended admission to hospitalist.  Orpah Greek, MD 02/03/15 [phone]

## 2015-02-03 NOTE — Care Management Note (Signed)
Case Management Note  Patient Details  Name: Pamela Mejia MRN: [id number] Date of Birth: May 03, 1926  Subjective/Objective:       urosepsis             Action/Plan:Date: February 03, 2015 Chart reviewed for concurrent status and case management needs. Will continue to follow patient for changes and needs: Velva Harman, RN, BSN, [place]   [phone]   Expected Discharge Date:                  Expected Discharge Plan:  Home/Self Care  In-House Referral:  NA  Discharge planning Services  CM Consult  Post Acute Care Choice:  NA Choice offered to:  NA  DME Arranged:    DME Agency:     HH Arranged:    [place] Agency:     Status of Service:     Medicare Important Message Given:    Date Medicare IM Given:    Medicare IM give by:    Date Additional Medicare IM Given:    Additional Medicare Important Message give by:     If discussed at [place] of Stay Meetings, dates discussed:    Additional Comments:  Leeroy Cha, RN 02/03/2015, 12:55 PM

## 2015-02-03 NOTE — ED Notes (Signed)
Unable to get an in and out cath as patient has prolapsed uterus blocking the vaginal opening.

## 2015-02-03 NOTE — Anesthesia Preprocedure Evaluation (Addendum)
Anesthesia Evaluation  Patient identified by MRN, date of birth, ID band Patient awake    Reviewed: Allergy & Precautions, NPO status , Patient's Chart, lab work & pertinent test results  Airway Mallampati: II  TM Distance: >3 FB Neck ROM: Full    Dental   Pulmonary asthma ,    breath sounds clear to auscultation       Cardiovascular hypertension, Pt. on medications  Rhythm:Regular Rate:Normal  Phenylephrine gtt for sepsis   Neuro/Psych negative neurological ROS     GI/Hepatic Neg liver ROS, Perforated diverticulitis   Endo/Other  Hypothyroidism   Renal/GU negative Renal ROS     Musculoskeletal negative musculoskeletal ROS (+)   Abdominal   Peds  Hematology  (+) anemia ,   Anesthesia Other Findings   Reproductive/Obstetrics                            Lab Results  Component Value Date   WBC 22.3* 02/03/2015   HGB 10.5* 02/03/2015   HCT 32.9* 02/03/2015   MCV 95.6 02/03/2015   PLT 188 02/03/2015   Lab Results  Component Value Date   CREATININE 0.81 02/03/2015   BUN 26* 02/03/2015   NA 137 02/03/2015   K 3.7 02/03/2015   CL 110 02/03/2015   CO2 22 02/03/2015    Anesthesia Physical Anesthesia Plan  ASA: IV and emergent  Anesthesia Plan: General   Post-op Pain Management:    Induction: Intravenous  Airway Management Planned: Oral ETT  Additional Equipment:   Intra-op Plan:   Post-operative Plan: Extubation in OR  Informed Consent: I have reviewed the patients History and Physical, chart, labs and discussed the procedure including the risks, benefits and alternatives for the proposed anesthesia with the patient or authorized representative who has indicated his/her understanding and acceptance.   Dental advisory given  Plan Discussed with: CRNA  Anesthesia Plan Comments:        Anesthesia Quick Evaluation

## 2015-02-03 NOTE — Anesthesia Postprocedure Evaluation (Signed)
  Anesthesia Post-op Note  Patient: Pamela Mejia  Procedure(s) Performed: Procedure(s): DIAGNOSTIC LAPAROSCOPY/OPEN SIGMOID COLECTOMY WITH TAKE DOWN OF SPLENIC FLEXURE AND END COLOSTOMY (N/A)  Patient Location: PACU  Anesthesia Type:General  Level of Consciousness: awake and alert   Airway and Oxygen Therapy: Patient Spontanous Breathing  Post-op Pain: mild  Post-op Assessment: Post-op Vital signs reviewed              Post-op Vital Signs: Reviewed  Last Vitals:  Filed Vitals:   02/03/15 1948  BP: 108/52  Pulse: 60  Temp: 36.3 C  Resp: 22    Complications: No apparent anesthesia complications

## 2015-02-03 NOTE — Op Note (Signed)
OPERATIVE REPORT   PREOPERATIVE DIAGNOSIS: Perforated diverticulitis.   POSTOPERATIVE DIAGNOSIS: Perforated diverticulitis.   PROCEDURE PERFORMED:Diagnostic laparoscopy, sigmoid colectomy with takedown of splenic flexure and end colostomy   SURGEON: Stark Klein, MD   ASSISTANT: Bronwen Betters, MD   ANESTHESIA: General.   FINDINGS: Perforated sigmoid colon   ESTIMATED BLOOD LOSS: <100 mL.   COMPLICATIONS: None known.   PROCEDURE:  Patient was identified in the holding area and taken to  the operating room where she was placed supine on the operating room  table. General anesthesia was induced. The abdomen was prepped and  draped in a sterile fashion. Time-out was performed according to the  surgical safety check list. When all was correct we continued.   The patient was placed into reverse trendelenburg position and rotated to the right.  A 5 mm Optiview port was placed in the subcostal location after administration of local.  An additional port was placed in the right mid abdomen.  After moving the omentum, it was apparent that there was copious purulent material free in the lower abdomen.  A decision was made to open.    The midline was incised and the subcutaneous tissues were divided with a  Bovie electrocautery. The fascia was opened in the midline.  Copious purulent fluid was aspirated from the abdomen, including the LUQ and the RUQ. The sigmoid colon perforation was located very low in the pelvis, on the proximal rectum.  The Balfour retractor was used to facilitate  visibility. The white line of Toldt of the descending colon was taken down with the Bovie  Electrocautery. This dissection was carried out proximally along the descending colon. The GIA stapler was used to fire across the sigmoid proximal to the diverticuli and the Ligasure was used to divide the mesocolon high up near the colon wall. In order to get adequate length of colon to reach the abdominal wall, the splenic  flexure had to be taken down with the cautery. The rectum was divided with the contour stapler distal to the site of inflammation.   The abdomen was then irrigated copiously until the irrigant was clear in all quadrants.   Attention was directed to the left abdomen for a location for an  ostomy. Kochers were used to pull the fascia to the midline and an  another Fransisca Kaufmann was used to elevate the skin to create a circular site.  A divot of fatty tissue was taken as well. The fascia was opened in a  cruciate fashion separating the longitudinal fibers of the rectus. A  lap was placed behind the fascia to ensure that the Bovie could not go  through and puncture any of the intra-abdominal organs. A Kary Kos was  then advanced through the abdominal wall and used to retract the stump  of the descending colon through the abdominal wall.   The abdomen was then closed using running #1 looped PDS sutures. The wound  was then irrigated and packed with moist Kerlix. The skin was irrigated and dressed with a wound vac. The colon was then opened with the Bovie.  Interrupted 3-0 Vicryl sutures were used to create the ostomy. This was then dressed with a colostomy appliance. The patient was then taken to the PACU in stable condition.

## 2015-02-03 NOTE — Progress Notes (Signed)
[facility] Progress Note Patient Name: Sidney Silberman DOB: [birth year] MRN: [id number]   Date of Service  02/03/2015  HPI/Events of Note  Post op assessment, seen earlier in day by Dr Elsworth Soho. D/w With Surgery   eICU Interventions  Will re evaluate post op, on low dose neo. May need line, consider bolus, continues stress roids, add antifungals to vanc, mero Lactic      Intervention Category Major Interventions: Hypotension - evaluation and management  FEINSTEIN,DANIEL J. 02/03/2015, 6:52 PM

## 2015-02-03 NOTE — Progress Notes (Signed)
Talked to patient and son [name] at bedside at 1:40pm.  Her daughter Lenna Sciara and other son Gwyndolyn Saxon are aware of the situation, but not present.  Discussed the patients pathophysiology of her current condition.  Discussed the options of treatment including medical management and surgical intervention.  We discussed the possibility that she may likely have gross contamination in her abdomen secondary to this diverticulitis perforation.  The son and patient are both in agreement that she must undergo surgery because if she does not proceed she will likely not improve with medical management alone.  I explained that we would start out as a diagnostic laparoscopy and hopefully be able to wash her out and leave drains, but there is a high possibility we will have to convert to open exploratory laparotomy and perform a bowel resection and/or ostomy.  I have asked the [facility] nurse to mark her for ileostomy and colostomy in case this were to occur.  They understand that this is usually temporary, but would depend on her health at the time of reversal in 3-6 months.  Because of her age and recent significant bout of bronchitis there is a high chance she may need to be on the ventilator post-operatively.  She is agreeable to put her DNR on hold and to proceed with surgery.  She does not want to be on the ventilator long term and everyone in the family is agreeable to this, but they are okay if she needs it for a short period of time post-operatively.  They understand there are risks including bleeding, infection, intraabdominal abscesses, injury to other intra-abdominal structures, post op infections and a number of medical problems she may face post-operatively, but they understand the risk/benefits and would like to proceed.   Jomarie Longs, PA-C General Surgery [facility] [phone]

## 2015-02-03 NOTE — Anesthesia Procedure Notes (Signed)
Procedure Name: Intubation Date/Time: 02/03/2015 4:20 PM Performed by: Noralyn Pick D Pre-anesthesia Checklist: Patient identified, Emergency Drugs available, Suction available and Patient being monitored Patient Re-evaluated:Patient Re-evaluated prior to inductionOxygen Delivery Method: Circle System Utilized Preoxygenation: Pre-oxygenation with 100% oxygen Intubation Type: IV induction Ventilation: Mask ventilation without difficulty Laryngoscope Size: Mac and 3 Grade View: Grade II Tube type: Subglottic suction tube Tube size: 7.5 mm Number of attempts: 1 Airway Equipment and Method: Stylet and Oral airway Placement Confirmation: ETT inserted through vocal cords under direct vision,  positive ETCO2 and breath sounds checked- equal and bilateral Secured at: 21 cm Tube secured with: Tape Dental Injury: Teeth and Oropharynx as per pre-operative assessment

## 2015-02-04 ENCOUNTER — Encounter (HOSPITAL_COMMUNITY): Payer: Self-pay | Admitting: General Surgery

## 2015-02-04 DIAGNOSIS — K631 Perforation of intestine (nontraumatic): Secondary | ICD-10-CM

## 2015-02-04 LAB — MAGNESIUM: Magnesium: 1.1 mg/dL — ABNORMAL LOW (ref 1.7–2.4)

## 2015-02-04 LAB — COMPREHENSIVE METABOLIC PANEL
ALBUMIN: 1.8 g/dL — AB (ref 3.5–5.0)
ALK PHOS: 66 U/L (ref 38–126)
ALT: 93 U/L — ABNORMAL HIGH (ref 14–54)
ANION GAP: 5 (ref 5–15)
AST: 53 U/L — AB (ref 15–41)
BILIRUBIN TOTAL: 1.2 mg/dL (ref 0.3–1.2)
BUN: 19 mg/dL (ref 6–20)
CALCIUM: 8 mg/dL — AB (ref 8.9–10.3)
CO2: 16 mmol/L — ABNORMAL LOW (ref 22–32)
CREATININE: 0.96 mg/dL (ref 0.44–1.00)
Chloride: 118 mmol/L — ABNORMAL HIGH (ref 101–111)
GFR calc Af Amer: >60 mL/min (ref >60)
GFR calc non Af Amer: 52 mL/min — ABNORMAL LOW (ref >60)
GLUCOSE: 84 mg/dL (ref 65–99)
Potassium: 3.7 mmol/L (ref 3.5–5.1)
Sodium: 139 mmol/L (ref 135–145)
TOTAL PROTEIN: 4 g/dL — AB (ref 6.5–8.1)

## 2015-02-04 LAB — CBC
HCT: 32.1 % — ABNORMAL LOW (ref 36.0–46.0)
Hemoglobin: 10.3 g/dL — ABNORMAL LOW (ref 12.0–15.0)
MCH: 30.5 pg (ref 26.0–34.0)
MCHC: 32.1 g/dL (ref 30.0–36.0)
MCV: 95 fL (ref 78.0–100.0)
PLATELETS: 147 10*3/uL — AB (ref 150–400)
RBC: 3.38 MIL/uL — AB (ref 3.87–5.11)
RDW: 14.9 % (ref 11.5–15.5)
WBC: 11.2 10*3/uL — AB (ref 4.0–10.5)

## 2015-02-04 LAB — PROCALCITONIN: Procalcitonin: 4.73 ng/mL

## 2015-02-04 LAB — URINE CULTURE: Culture: NO GROWTH

## 2015-02-04 LAB — CALCIUM, IONIZED: Calcium, Ionized, Serum: 4.7 mg/dL (ref 4.5–5.6)

## 2015-02-04 LAB — PHOSPHORUS: Phosphorus: 3.2 mg/dL (ref 2.5–4.6)

## 2015-02-04 MED ORDER — NAPHAZOLINE HCL 0.1 % OP SOLN
1.0000 [drp] | Freq: Four times a day (QID) | OPHTHALMIC | Status: DC | PRN
  Filled 2015-02-04: qty 15

## 2015-02-04 MED ORDER — MAGNESIUM SULFATE 50 % IJ SOLN
6.0000 g | Freq: Once | INTRAVENOUS | Status: AC
  Administered 2015-02-04: 6 g via INTRAVENOUS
  Filled 2015-02-04: qty 10

## 2015-02-04 MED ORDER — HYDROCORTISONE NA SUCCINATE PF 100 MG IJ SOLR
50.0000 mg | Freq: Three times a day (TID) | INTRAMUSCULAR | Status: DC
  Administered 2015-02-04 – 2015-02-05 (×3): 50 mg via INTRAVENOUS
  Filled 2015-02-04 (×3): qty 2

## 2015-02-04 MED ORDER — CETYLPYRIDINIUM CHLORIDE 0.05 % MT LIQD
7.0000 mL | Freq: Two times a day (BID) | OROMUCOSAL | Status: DC
  Administered 2015-02-04 – 2015-02-10 (×10): 7 mL via OROMUCOSAL

## 2015-02-04 MED ORDER — MAGNESIUM SULFATE 2 GM/50ML IV SOLN: 2.0000 g | Freq: Once | INTRAVENOUS | Status: DC

## 2015-02-04 MED ORDER — NAPHAZOLINE-PHENIRAMINE 0.025-0.3 % OP SOLN
1.0000 [drp] | Freq: Four times a day (QID) | OPHTHALMIC | Status: DC | PRN
  Filled 2015-02-04: qty 5

## 2015-02-04 MED ORDER — SODIUM CHLORIDE 0.9 % IV BOLUS (SEPSIS)
1000.0000 mL | Freq: Once | INTRAVENOUS | Status: AC
  Administered 2015-02-04: 02:00:00 via INTRAVENOUS
  Administered 2015-02-04: 1000 mL via INTRAVENOUS

## 2015-02-04 NOTE — Progress Notes (Signed)
25 mcgs Fentanyl WIS with Jeannie Fend, RN

## 2015-02-04 NOTE — Progress Notes (Signed)
Spoke with pt about code status.  Pt stated she wanted her status to be changed to DNR.  Pt is A&Ox4.  Informed Threasa Beards NP about pt wishes.  Irven [place], RN

## 2015-02-04 NOTE — Progress Notes (Signed)
[facility] Progress Note  1 Day Post-Op  Subjective: Pt doing pretty well, her pre-operative pain has resolved, and her pain is now soreness.  No N/V, no fever/chills.  Hasn't been OOB yet.  C/o dry mouth, wants ice chips.  Foley in place.    Objective: Vital signs in last 24 hours: Temp:  [97.4 F (36.3 C)-99 F (37.2 C)] 98.8 F (37.1 C) (11/10 0316) Pulse Rate:  [58-78] 66 (11/10 0724) Resp:  [11-30] 20 (11/10 0724) BP: (84-128)/(37-76) 97/45 mmHg (11/10 0724) SpO2:  [93 %-100 %] 94 % (11/10 0724) Weight:  [67.9 kg (149 lb 11.1 oz)] 67.9 kg (149 lb 11.1 oz) (11/09 1948)    Intake/Output from previous day: 11/09 0701 - 11/10 0700 In: 7894.5 [I.V.:6384.5; IV Piggyback:1510] Out: [id number] [Urine:330; Stool:2; Blood:200] Intake/Output this shift:    PE: Gen:  Alert, NAD, pleasant Card:  RRR, no M/G/R heard Pulm:  CTA, no W/R/R, good effort Abd: Soft, mildly less distended than yesterday, tender over midline wound, +BS, no HSM, midline wound clean, serosang drainage only, ostomy pink with condensation in bag, but not much flatus and no BM yet. Ext:  No erythema, edema, or tenderness  Lab Results:   Recent Labs  02/03/15 2125 02/04/15 0345  WBC 12.2* 11.2*  HGB 11.9* 10.3*  HCT 37.3 32.1*  PLT 185 147*   BMET  Recent Labs  02/03/15 1935 02/04/15 0641  NA 139 139  K 4.3 3.7  CL 114* 118*  CO2 17* 16*  GLUCOSE 85 84  BUN 16 19  CREATININE 0.91 0.96  CALCIUM 7.7* 8.0*   PT/INR No results for input(s): LABPROT, INR in the last 72 hours. CMP     Component Value Date/Time   NA 139 02/04/2015 0641   K 3.7 02/04/2015 0641   CL 118* 02/04/2015 0641   CO2 16* 02/04/2015 0641   GLUCOSE 84 02/04/2015 0641   BUN 19 02/04/2015 0641   CREATININE 0.96 02/04/2015 0641   CALCIUM 8.0* 02/04/2015 0641   PROT 4.0* 02/04/2015 0641   ALBUMIN 1.8* 02/04/2015 0641   AST 53* 02/04/2015 0641   ALT 93* 02/04/2015 0641   ALKPHOS 66 02/04/2015 0641   BILITOT 1.2  02/04/2015 0641   GFRNONAA 52* 02/04/2015 0641   GFRAA >60 02/04/2015 0641   Lipase  No results found for: LIPASE     Studies/Results: Ct Abdomen Pelvis Wo Contrast  02/03/2015  CLINICAL DATA:  Abdominal pain EXAM: CT ABDOMEN AND PELVIS WITHOUT CONTRAST TECHNIQUE: Multidetector CT imaging of the abdomen and pelvis was performed following the standard protocol without IV contrast. COMPARISON:  None. FINDINGS: Lower chest and abdominal wall: Large hiatal hernia with gas fluid retention. Hepatobiliary: No focal liver abnormality.Cholecystectomy unremarkable common bile duct diameter. Pancreas: Unremarkable. Spleen: Unremarkable. Adrenals/Urinary Tract: Negative adrenals. No hydronephrosis or stone. Bladder descent from pelvic floor laxity. Reproductive:Pelvic floor laxity with fluid density in the anterior perineum likely reflecting ascites when correlated with sagittal reformats. Hysterectomy. Negative adnexae. Stomach/Bowel: Moderate pneumoperitoneum distributed throughout the abdomen consistent with bowel perforation. The exact site of perforation is not identified, although a distal colonic diverticular source (extensive colonic diverticulosis) is favored given lack of oral contrast extravasation (which reaches the a proximal ascending colon), pelvic fat inflammatory changes, and ascitic fluid accumulation in the pelvis. No thickening of the stomach or duodenum. No bowel obstruction. Negative appendix. Pelvic ascites which is small volume but loculated in the bilateral and deep pelvis where collections measure 3 to 5 cm diameter. Vascular/Lymphatic:  No acute vascular abnormality. No mass or adenopathy. Musculoskeletal: L4-5 facet arthropathy with grade 1 anterolisthesis. Critical Value/emergent results were called by telephone at the time of interpretation on 02/03/2015 at 1:37 am to Dr. Josephina Gip , who verbally acknowledged these results. IMPRESSION: 1. Bowel perforation with moderate  pneumoperitoneum. Source is uncertain but perforated colonic diverticulum is favored. No extravasation of oral contrast which reaches the ascending colon. Small ascites with loculation in the pelvis. 2. Large hiatal hernia. Electronically Signed   By: Monte Fantasia M.D.   On: 02/03/2015 01:38   Dg Abd Acute W/chest  02/02/2015  CLINICAL DATA:  Abdominal pain EXAM: DG ABDOMEN ACUTE W/ 1V CHEST COMPARISON:  None. FINDINGS: Borderline cardiomegaly. There is a large hiatal hernia with gas fluid level. Negative aortic and hilar contours. There is no edema, consolidation, effusion, or pneumothorax. Nonobstructive small bowel and colonic gas pattern. Moderate stool burden with formed stool from the splenic flexure to the rectum. No concerning intra-abdominal mass effect or calcification. Cholecystectomy changes. No pneumoperitoneum. IMPRESSION: 1. Large hiatal hernia with fluid level. 2. No evidence of acute cardiopulmonary disease. 3. Moderate stool volume. Electronically Signed   By: Monte Fantasia M.D.   On: 02/02/2015 22:59   Dg Abd Portable 1v  02/03/2015  CLINICAL DATA:  Check nasogastric catheter placement EXAM: PORTABLE ABDOMEN - 1 VIEW COMPARISON:  CT from earlier in the same day FINDINGS: Scattered large and small bowel gas is noted. A nasogastric catheter is not visualized on this exam. A large hiatal hernia is seen. Contrast material is noted within the bowel from previous CT. Changes of a left lower quadrant ostomy are now noted. IMPRESSION: Large hiatal hernia.  No nasogastric catheter is seen. Electronically Signed   By: Inez Catalina M.D.   On: 02/03/2015 18:58    Anti-infectives: Anti-infectives    Start     Dose/Rate Route Frequency Ordered Stop   02/04/15 2359  levofloxacin (LEVAQUIN) IVPB 750 mg  Status:  Discontinued     750 mg 100 mL/hr over 90 Minutes Intravenous Every 48 hours 02/03/15 0149 02/03/15 0359   02/04/15 2359  levofloxacin (LEVAQUIN) IVPB 750 mg  Status:  Discontinued      750 mg 100 mL/hr over 90 Minutes Intravenous Every 48 hours 02/03/15 0404 02/03/15 0948   02/04/15 1800  anidulafungin (ERAXIS) 100 mg in sodium chloride 0.9 % 100 mL IVPB     100 mg over 90 Minutes Intravenous Every 24 hours 02/03/15 1844     02/04/15 0000  levofloxacin (LEVAQUIN) IVPB 750 mg  Status:  Discontinued     750 mg 100 mL/hr over 90 Minutes Intravenous Every 48 hours 02/03/15 1847 02/03/15 1852   02/03/15 2359  vancomycin (VANCOCIN) IVPB 1000 mg/200 mL premix  Status:  Discontinued     1,000 mg 200 mL/hr over 60 Minutes Intravenous Every 24 hours 02/03/15 0215 02/03/15 0359   02/03/15 2000  meropenem (MERREM) 1 g in sodium chloride 0.9 % 100 mL IVPB     1 g 200 mL/hr over 30 Minutes Intravenous Every 12 hours 02/03/15 1856     02/03/15 2000  vancomycin (VANCOCIN) 500 mg in sodium chloride 0.9 % 100 mL IVPB     500 mg 100 mL/hr over 60 Minutes Intravenous Every 12 hours 02/03/15 1856     02/03/15 1845  anidulafungin (ERAXIS) 200 mg in sodium chloride 0.9 % 200 mL IVPB     200 mg over 180 Minutes Intravenous STAT 02/03/15 1844 02/03/15 2214  02/03/15 1030  [MAR Hold]  meropenem (MERREM) 500 mg in sodium chloride 0.9 % 50 mL IVPB  Status:  Discontinued     (MAR Hold since 02/03/15 1435)   500 mg 100 mL/hr over 30 Minutes Intravenous 2 times daily 02/03/15 1004 02/03/15 1856   02/03/15 1000  aztreonam (AZACTAM) 2 g in dextrose 5 % 50 mL IVPB  Status:  Discontinued     2 g 100 mL/hr over 30 Minutes Intravenous Every 8 hours 02/03/15 0149 02/03/15 0359   02/03/15 1000  ertapenem (INVANZ) 1 g in sodium chloride 0.9 % 50 mL IVPB  Status:  Discontinued     1 g 100 mL/hr over 30 Minutes Intravenous Every 24 hours 02/03/15 0948 02/03/15 1003   02/03/15 0415  [MAR Hold]  metroNIDAZOLE (FLAGYL) IVPB 500 mg  Status:  Discontinued     (MAR Hold since 02/03/15 1435)   500 mg 100 mL/hr over 60 Minutes Intravenous 3 times per day 02/03/15 0359 02/03/15 1852   02/03/15 0400  levofloxacin  (LEVAQUIN) IVPB 750 mg  Status:  Discontinued     750 mg 100 mL/hr over 90 Minutes Intravenous  Once 02/03/15 0359 02/03/15 0402   02/03/15 0030  levofloxacin (LEVAQUIN) IVPB 750 mg     750 mg 100 mL/hr over 90 Minutes Intravenous  Once 02/03/15 0022 02/03/15 0231   02/03/15 0030  aztreonam (AZACTAM) 2 g in dextrose 5 % 50 mL IVPB     2 g 100 mL/hr over 30 Minutes Intravenous  Once 02/03/15 0022 02/03/15 0131   02/03/15 0030  vancomycin (VANCOCIN) IVPB 1000 mg/200 mL premix     1,000 mg 200 mL/hr over 60 Minutes Intravenous  Once 02/03/15 0022 02/03/15 0229       Assessment/Plan Pneumoperitoneum 2*Perforated diverticulitis POD #1 s/p Diagnostic laparoscopy, converted to open sigmoid colectomy with takedown of splenic flexure and end colostomy  -Did not have NG tube post surgery, NPO, IVF, pain control, antiemetics -Peritoneal culture gram stain shows gram positive cocci in chains/pairs, gram neg rods, gram positive rods, and yeast -IV antibiotics - On Merrem and vancomycin Day #2/10, Eraxis for fungal coverage Day #2 -Await return of bowel function prior to advancing diet -WOC nursing/ostomy care -Dressing changes - Fascia is closed, WD BID to open midline wound until we can get it clean enough for a wound vac -Mg being supplemented -Ordered PT/OT -Appreciate CCM management of this patient  Large Hiatal hernia H/o multiple abdominal surgeries - cholecystectomy, bladder surgery, abd hysterectomy DNR, lives at independent living H/o ocular melanoma HTN Hypothyroidism Asthma with recent bronchitis treated with prednisone taper    LOS: 1 day    Nat Christen 02/04/2015, 7:51 AM Pager: [phone]

## 2015-02-04 NOTE — Consult Note (Addendum)
WOC ostomy consult note (Initial post op) Stoma type/location: LLQ colostomy Stomal assessment/size: Oval, 1 and 1/4  x  1 and 3/4, slightly oblique and within close approximation to midline surgical wound left open to heal by secondary intention at the medial stomal margin Peristomal assessment: intact, clear, slight parastomal depression Treatment options for stomal/peristomal skin: Skin barrier ring Output Scant serosanguinous Ostomy pouching: 1pc.flat, flexible with skin barrier ring cut off center.  Education provided: Patient is taught today that an ostomy nurse will be working with her and teaching her some of the principles of self-care as her endurance improves and her head clears. Noted is the teaching booklet left by my partner yesterday post marking at the bedside. I am not sure today if the one-piece pouch applied today will be a short or long-term solution, another consideration may be a Karaya pouch for ease in management, mild convexity. Enrolled patient in [place] program: No  WOC nursing team  will remain available to this patient, the nursing, surgical and medical teams.  Thanks, Maudie Flakes, MSN, RN, [place], Arther Abbott  Pager# [phone]

## 2015-02-04 NOTE — Progress Notes (Signed)
[facility] Progress Note Patient Name: Pamela Mejia DOB: Dec 06, 1926 MRN: [id number]   Date of Service  02/04/2015  HPI/Events of Note    eICU Interventions  NS 1 liter     Intervention Category Intermediate Interventions: Hypotension - evaluation and management  Merton Border 02/04/2015, 1:54 AM

## 2015-02-04 NOTE — Progress Notes (Signed)
Called by RN Shanon Brow to discuss code status.  Pt previously DNR/DNI.  Changed to limited code peri-operatively overnight.  She is now awake, alert, oriented and improving.  She wishes to remain full DNR/DNI.  Will change order from LCB to DNR.  No change to full aggressive medical care.   Nickolas Madrid, NP 02/04/2015  11:22 AM Pager: [phone] or [phone]

## 2015-02-04 NOTE — Plan of Care (Signed)
Problem: Cardiac: Goal: Will show no evidence of cardiac arrhythmias Outcome: Progressing Pt continues to be in nsr.

## 2015-02-04 NOTE — Progress Notes (Signed)
PULMONARY / CRITICAL CARE MEDICINE   Name: Pamela Mejia MRN: [id number] DOB: [birth year]    ADMISSION DATE:  02/02/2015 CONSULTATION DATE:  02/03/15  REFERRING MD :  Loleta Books  CHIEF COMPLAINT:  Abd pain  INITIAL PRESENTATION:  81 y.o. F from independent living, brought to [facility] ED 11/8 for abd pain.  Found to have bowel perf (perf diverticulitis) with pneumoperitoneum.  Admitted by [facility] but developed hypotension; therefore, PCCM called in consultation. Taken to OR 11/9.    STUDIES:  CT A/P 11/8 >>> bowel perf with mod pneumoperitoneum, suspected due to perforated colonic diverticulum.  Large hiatal hernia.  SIGNIFICANT EVENTS: 11/8 - admitted with bowel perf. 11/9 OR>>> sigmoid colectomy with colostomy   SUBJECTIVE: To OR overnight.  Off pressors this am.  C/o abd soreness.  Denies SOB.   VITAL SIGNS: Temp:  [97.4 F (36.3 C)-98.8 F (37.1 C)] 98 F (36.7 C) (11/10 0800) Pulse Rate:  [58-73] 66 (11/10 0724) Resp:  [11-30] 20 (11/10 0724) BP: (84-128)/(37-66) 97/45 mmHg (11/10 0724) SpO2:  [93 %-100 %] 94 % (11/10 0724) Weight:  [149 lb 11.1 oz (67.9 kg)] 149 lb 11.1 oz (67.9 kg) (11/09 1948) HEMODYNAMICS:   VENTILATOR SETTINGS:   INTAKE / OUTPUT: Intake/Output      11/09 0701 - 11/10 0700 11/10 0701 - 11/11 0700   I.V. (mL/kg) 6384.5 (94)    IV Piggyback 1510    Total Intake(mL/kg) 7894.5 (116.3)    Urine (mL/kg/hr) 330 (0.2)    Stool 2 (0)    Blood 200 (0.1)    Total Output 532     Net +7362.5            PHYSICAL EXAMINATION: General: Elderly female, in NAD. Neuro: A&O x 3, non-focal.  HEENT: [place]/AT. PERRL, sclerae anicteric. Cardiovascular: RRR, no M/R/G.  Lungs: Respirations even and unlabored. Few scattered rhonchi.  Abdomen: -bs, L colostomy, midline incision with clean dressing (dressing not removed but skin remains open per surgery with fascial closure)  Musculoskeletal: No gross deformities, no edema.  Skin: Intact, warm, no  rashes.  LABS:  CBC  Recent Labs Lab 02/03/15 0500 02/03/15 2125 02/04/15 0345  WBC 22.3* 12.2* 11.2*  HGB 10.5* 11.9* 10.3*  HCT 32.9* 37.3 32.1*  PLT 188 185 147*   Coag's No results for input(s): APTT, INR in the last 168 hours. BMET  Recent Labs Lab 02/03/15 0500 02/03/15 1935 02/04/15 0641  NA 137 139 139  K 3.7 4.3 3.7  CL 110 114* 118*  CO2 22 17* 16*  BUN 26* 16 19  CREATININE 0.81 0.91 0.96  GLUCOSE 104* 85 84   Electrolytes  Recent Labs Lab 02/03/15 0500 02/03/15 1935 02/04/15 0641  CALCIUM 7.4* 7.7* 8.0*  MG  --   --  1.1*  PHOS  --   --  3.2   Sepsis Markers  Recent Labs Lab 02/03/15 0500 02/03/15 0840 02/03/15 1935 02/04/15 0345  LATICACIDVEN 1.6 1.8 1.9  --   PROCALCITON  --  3.14  --  4.73   ABG No results for input(s): PHART, PCO2ART, PO2ART in the last 168 hours. Liver Enzymes  Recent Labs Lab 02/02/15 2249 02/03/15 0500 02/04/15 0641  AST 18 90* 53*  ALT 24 65* 93*  ALKPHOS 72 71 66  BILITOT 1.3* 2.0* 1.2  ALBUMIN 3.0* 2.2* 1.8*   Cardiac Enzymes  Recent Labs Lab 02/02/15 2248  TROPONINI <0.03   Glucose No results for input(s): GLUCAP in the last 168 hours.  Imaging Dg  Abd Portable 1v  02/03/2015  CLINICAL DATA:  Check nasogastric catheter placement EXAM: PORTABLE ABDOMEN - 1 VIEW COMPARISON:  CT from earlier in the same day FINDINGS: Scattered large and small bowel gas is noted. A nasogastric catheter is not visualized on this exam. A large hiatal hernia is seen. Contrast material is noted within the bowel from previous CT. Changes of a left lower quadrant ostomy are now noted. IMPRESSION: Large hiatal hernia.  No nasogastric catheter is seen. Electronically Signed   By: Inez Catalina M.D.   On: 02/03/2015 18:58   ASSESSMENT / PLAN:   GASTROINTESTINAL A:   Bowel perforation with moderate pneumoperitoneum - s/p surgical repair 11/9 Transaminitis. Nutrition. P:   Surgery managing  F/u LFT's  NPO. NG to  suction   INFECTIOUS A:   Peritonitis due to bowel perforation. P:   BCx2 11/8 > UCx 11/8 > Peritoneal fluid 11/9>>>  GNR, GPC>>>  Levaquin 11/9>>>11/9 Flagyl 11/9>>>off  Meropenem 11/9>>> Vanc 11/9>>> anidulafungin 11/10>>>  trend pct   PULMONARY A: At risk atelectasis due to splinting. Hx Asthma. P:   Incentive spirometry. Pulmonary hygiene. Budesonide / Brovana in lieu of outpatient Advair. CXR intermittently Mobilize   CARDIOVASCULAR A:  Septic shock - r/t peritonitis +/- hypovolemia from decreased PO intake Hx HTN. P:  Gentle volume  Monitor UOP  Hold outpatient amlodipine, hydrochlorothiazide, losartan. Stress steroids -- was on slow steroid taper as outpt ?reason   RENAL A:   AKI - likely pre-renal from decreased PO intake - improved.  Hypocalcemia - corrects to 8.8. HypoMg P:   NS @ 125. 2gm Mg  BMP, mg, phos in AM.  HEMATOLOGIC A:   Mild anemia. VTE Prophylaxis. P:  Transfuse for Hgb < 7. SCD's / Heparin. CBC in AM.  ENDOCRINE A:   Hypothyroidism.   P:   Continue outpatient synthroid, change to IV formulation.  NEUROLOGIC A:   Pain secondary to bowel perforation. P:   Fentanyl PRN. Dilaudid d/c'd.   Family updated: No family available 11/10  Nickolas Madrid, NP 02/04/2015  8:34 AM Pager: [phone] or [phone]

## 2015-02-05 LAB — BASIC METABOLIC PANEL
ANION GAP: 7 (ref 5–15)
ANION GAP: 7 (ref 5–15)
BUN: 27 mg/dL — ABNORMAL HIGH (ref 6–20)
BUN: 28 mg/dL — ABNORMAL HIGH (ref 6–20)
CALCIUM: 9.3 mg/dL (ref 8.9–10.3)
CHLORIDE: 117 mmol/L — AB (ref 101–111)
CHLORIDE: 118 mmol/L — AB (ref 101–111)
CO2: 18 mmol/L — ABNORMAL LOW (ref 22–32)
CO2: 19 mmol/L — ABNORMAL LOW (ref 22–32)
CREATININE: 1.01 mg/dL — AB (ref 0.44–1.00)
CREATININE: 1.08 mg/dL — AB (ref 0.44–1.00)
Calcium: 8.8 mg/dL — ABNORMAL LOW (ref 8.9–10.3)
GFR calc non Af Amer: 49 mL/min — ABNORMAL LOW (ref >60)
GFR calc non Af Amer: 45 mL/min — ABNORMAL LOW (ref >60)
GFR, EST AFRICAN AMERICAN: 57 mL/min — AB (ref >60)
GFR, EST AFRICAN AMERICAN: 52 mL/min — AB (ref >60)
Glucose, Bld: 104 mg/dL — ABNORMAL HIGH (ref 65–99)
Glucose, Bld: 106 mg/dL — ABNORMAL HIGH (ref 65–99)
Potassium: 3.3 mmol/L — ABNORMAL LOW (ref 3.5–5.1)
Potassium: 3.8 mmol/L (ref 3.5–5.1)
SODIUM: 142 mmol/L (ref 135–145)
SODIUM: 144 mmol/L (ref 135–145)

## 2015-02-05 LAB — CBC
HEMATOCRIT: 28.3 % — AB (ref 36.0–46.0)
HEMOGLOBIN: 9.5 g/dL — AB (ref 12.0–15.0)
MCH: 31.5 pg (ref 26.0–34.0)
MCHC: 33.6 g/dL (ref 30.0–36.0)
MCV: 93.7 fL (ref 78.0–100.0)
Platelets: 154 10*3/uL (ref 150–400)
RBC: 3.02 MIL/uL — AB (ref 3.87–5.11)
RDW: 15.1 % (ref 11.5–15.5)
WBC: 11.4 10*3/uL — ABNORMAL HIGH (ref 4.0–10.5)

## 2015-02-05 LAB — MAGNESIUM: MAGNESIUM: 2.2 mg/dL (ref 1.7–2.4)

## 2015-02-05 LAB — PROCALCITONIN: Procalcitonin: 3.27 ng/mL

## 2015-02-05 LAB — VANCOMYCIN, TROUGH: Vancomycin Tr: 12 ug/mL (ref 10.0–20.0)

## 2015-02-05 LAB — PHOSPHORUS: Phosphorus: 3.1 mg/dL (ref 2.5–4.6)

## 2015-02-05 MED ORDER — POTASSIUM CHLORIDE 10 MEQ/100ML IV SOLN
10.0000 meq | INTRAVENOUS | Status: AC
  Administered 2015-02-05 (×2): 10 meq via INTRAVENOUS
  Filled 2015-02-05: qty 100

## 2015-02-05 MED ORDER — POTASSIUM CHLORIDE IN NACL 40-0.9 MEQ/L-% IV SOLN
INTRAVENOUS | Status: DC
  Administered 2015-02-05: 125 mL/h via INTRAVENOUS
  Administered 2015-02-05 – 2015-02-06 (×2): 100 mL/h via INTRAVENOUS
  Administered 2015-02-07: 75 mL/h via INTRAVENOUS
  Filled 2015-02-05 (×7): qty 1000

## 2015-02-05 MED ORDER — ALBUTEROL SULFATE (2.5 MG/3ML) 0.083% IN NEBU
2.5000 mg | INHALATION_SOLUTION | Freq: Four times a day (QID) | RESPIRATORY_TRACT | Status: DC | PRN
  Administered 2015-02-05 – 2015-02-08 (×3): 2.5 mg via RESPIRATORY_TRACT
  Filled 2015-02-05 (×4): qty 3

## 2015-02-05 MED ORDER — ALBUTEROL SULFATE HFA 108 (90 BASE) MCG/ACT IN AERS: 2.0000 | INHALATION_SPRAY | Freq: Four times a day (QID) | RESPIRATORY_TRACT | Status: DC | PRN

## 2015-02-05 NOTE — Progress Notes (Signed)
2 Days Post-Op  Subjective: She is alert, says she had a bad night, having pain and could not sleep.  No BS, but she does have allot of gas in the ostomy bag, no fluid this AM at all.  Objective: Vital signs in last 24 hours: Temp:  [97.6 F (36.4 C)-98.5 F (36.9 C)] 97.8 F (36.6 C) (11/11 0402) Pulse Rate:  [49-73] 49 (11/11 0600) Resp:  [10-31] 15 (11/11 0600) BP: (95-130)/(39-56) 115/43 mmHg (11/11 0600) SpO2:  [91 %-95 %] 95 % (11/11 0600) Weight:  [73.1 kg (161 lb 2.5 oz)] 73.1 kg (161 lb 2.5 oz) (11/11 0500)  930 urine  11 liters Positive fluid balance at this time. Afebrile, VSS some bradycardia, good stats on the [facility] WBC stable 11K CXR yesterday shows large HH/NG not seen Intake/Output from previous day: 11/10 0701 - 11/11 0700 In: 3275 [I.V.:2875; IV Piggyback:400] Out: 930 [Urine:930] Intake/Output this shift: Total I/O In: 2025 [I.V.:1625; IV Piggyback:400] Out: 550 [Urine:550]  General appearance: alert, cooperative, no distress and frail and uncomfortable Resp: clear to auscultation bilaterally and anterior exam GI: soft, still having pain, no BS, lots of gas in Ostomy bag, no fluid in bag.  Lab Results:   Recent Labs  02/04/15 0345 02/05/15 0340  WBC 11.2* 11.4*  HGB 10.3* 9.5*  HCT 32.1* 28.3*  PLT 147* 154    BMET  Recent Labs  02/03/15 1935 02/04/15 0641  NA 139 139  K 4.3 3.7  CL 114* 118*  CO2 17* 16*  GLUCOSE 85 84  BUN 16 19  CREATININE 0.91 0.96  CALCIUM 7.7* 8.0*   PT/INR No results for input(s): LABPROT, INR in the last 72 hours.   Recent Labs Lab 02/02/15 2249 02/03/15 0500 02/04/15 0641  AST 18 90* 53*  ALT 24 65* 93*  ALKPHOS 72 71 66  BILITOT 1.3* 2.0* 1.2  PROT 5.5* 4.3* 4.0*  ALBUMIN 3.0* 2.2* 1.8*     Lipase  No results found for: LIPASE   Studies/Results: Dg Abd Portable 1v  02/03/2015  CLINICAL DATA:  Check nasogastric catheter placement EXAM: PORTABLE ABDOMEN - 1 VIEW COMPARISON:  CT from earlier in  the same day FINDINGS: Scattered large and small bowel gas is noted. A nasogastric catheter is not visualized on this exam. A large hiatal hernia is seen. Contrast material is noted within the bowel from previous CT. Changes of a left lower quadrant ostomy are now noted. IMPRESSION: Large hiatal hernia.  No nasogastric catheter is seen. Electronically Signed   By: Inez Catalina M.D.   On: 02/03/2015 18:58    Medications: . anidulafungin  100 mg Intravenous Q24H  . antiseptic oral rinse  7 mL Mouth Rinse BID  . arformoterol  15 mcg Nebulization BID  . budesonide (PULMICORT) nebulizer solution  0.5 mg Nebulization BID  . heparin subcutaneous  5,000 Units Subcutaneous 3 times per day  . hydrocortisone sodium succinate  50 mg Intravenous Q8H  . levothyroxine  12.5 mcg Intravenous Daily  . meropenem (MERREM) IV  1 g Intravenous Q12H  . sodium chloride  500 mL Intravenous Once  . sodium chloride  3 mL Intravenous Q12H  . vancomycin  500 mg Intravenous Q12H   . sodium chloride 125 mL/hr at 02/05/15 0522   Prior to Admission medications   Medication Sig Start Date End Date Taking? Authorizing Provider  amLODipine (NORVASC) 5 MG tablet Take 5 mg by mouth daily.   Yes Historical Provider, MD  Fluticasone-Salmeterol (ADVAIR) 100-50 MCG/DOSE  AEPB Inhale 1 puff into the lungs 2 (two) times daily.   Yes Historical Provider, MD  hydrochlorothiazide (MICROZIDE) 12.5 MG capsule Take 12.5 mg by mouth daily.   Yes Historical Provider, MD  Levothyroxine Sodium 25 MCG CAPS Take by mouth daily before breakfast.   Yes Historical Provider, MD  losartan (COZAAR) 100 MG tablet Take 100 mg by mouth daily.   Yes Historical Provider, MD  naproxen sodium (ANAPROX) 220 MG tablet Take 220 mg by mouth 2 (two) times daily as needed (pain).   Yes Historical Provider, MD  Omega-3 Fatty Acids (FISH OIL PO) Take 1 capsule by mouth daily.   Yes Historical Provider, MD  predniSONE (DELTASONE) 10 MG tablet Take 10 mg by mouth 2  (two) times daily with a meal. For 15 days   Yes Historical Provider, MD    Assessment/Plan Perforated Diverticulitis Sepsis with hypotension Diagnostic laparoscopy, sigmoid colectomy with takedown of splenic flexure and end colostomy, 02/03/15, Dr. Stark Klein Hx of Hypertension Hx of Hypothyroid  Hx of Blind with occular melanoma Hx of Bladder prolapse Antibiotics:  Meropenem/vancomycin day 3 DVT:  Heparin/SCD  Plan:  She is doing fine, I have added some KCL to her IV and will recheck this afternoon to see if her K+ is better, No BS, so keep her NPO except ice chips, OOB to chair more today.  Ostomy looks fine some gas in ostomy bag this AM.       LOS: 2 days    Hilliary Jock 02/05/2015

## 2015-02-05 NOTE — Progress Notes (Signed)
Pt is from [facility]. 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 CSW is availble to assist with d/c planning if a higher level of care is needed.   Werner Lean LCSW [phone]

## 2015-02-05 NOTE — Evaluation (Signed)
Occupational Therapy Evaluation Patient Details Name: Pamela Mejia MRN: [id number] DOB: October 09, 1926 Today's Date: 02/05/2015    History of Present Illness Pamela Mejia is a 81 y.o. female with a past medical history significant for HTN, asthma persistent, and hypothyroidism who presents with acute abdominal pain; CT of the abdomen and pelvis showed pneumoperitoneum with presumed perforated diverticulitis. 11/9 Diagnostic laparoscopy, sigmoid colectomy with takedown of splenic flexure and end colostomy    Clinical Impression   This 81 yo female admitted and underwent above presents to acute OT with decreased mobility, decreased balance, increased pain all affecting her ability to care for herself at an independent level as she was pta. She will benefit from acute OT with follow up at SNF to get back to PLOF.    Follow Up Recommendations  SNF    Equipment Recommendations   (TBD at next venue)       Precautions / Restrictions Precautions Precautions: Fall Restrictions Weight Bearing Restrictions: No      Mobility Bed Mobility Overal bed mobility: Needs Assistance Bed Mobility: Supine to Sit     Supine to sit: Min assist;HOB elevated        Transfers Overall transfer level: Needs assistance Equipment used: 1 person hand held assist Transfers: Sit to/from [facility] Sit to Stand: Min assist Stand pivot transfers: Min assist            Balance Overall balance assessment: Needs assistance Sitting-balance support: Feet supported;No upper extremity supported Sitting balance-Leahy Scale: Fair     Standing balance support: Single extremity supported Standing balance-Leahy Scale: Poor                              ADL Overall ADL's : Needs assistance/impaired Eating/Feeding: Independent;Sitting   Grooming: Set up;Sitting   Upper Body Bathing: Set up;Sitting   Lower Body Bathing: Moderate assistance (min A sit<>stand)   Upper Body  Dressing : Minimal assistance;Sitting   Lower Body Dressing: Maximal assistance (min a sit<>stand)   Toilet Transfer: Minimal assistance;Stand-pivot;BSC   Toileting- Clothing Manipulation and Hygiene: Moderate assistance (min A sit<>stand)                         Pertinent Vitals/Pain Pain Assessment: Faces Faces Pain Scale: Hurts little more Pain Location: abdominal with coughing Pain Intervention(s): Monitored during session;Repositioned (had pt use pillow to hold against stomach when she coughed)        Extremity/Trunk Assessment Upper Extremity Assessment Upper Extremity Assessment: Overall WFL for tasks assessed   Lower Extremity Assessment Lower Extremity Assessment: Defer to PT evaluation          Cognition Arousal/Alertness: Awake/alert Behavior During Therapy: WFL for tasks assessed/performed Overall Cognitive Status: Within Functional Limits for tasks assessed                                        OT Diagnosis: Generalized weakness;Acute pain   OT Problem List: Decreased strength;Impaired balance (sitting and/or standing);Pain   OT Treatment/Interventions: Self-care/ADL training;Patient/family education;Balance training;Therapeutic activities;DME and/or AE instruction    OT Goals(Current goals can be found in the care plan section) Acute Rehab OT Goals Patient Stated Goal: to get back to independent again OT Goal Formulation: With patient Time For Goal Achievement: 02/19/15 Potential to Achieve Goals: Good  OT Frequency: Min 2X/week  Barriers to D/C: Decreased caregiver support             End of Session Equipment Utilized During Treatment: Oxygen (3 liters) Nurse Communication: Mobility status  Activity Tolerance: Patient tolerated treatment well Patient left: in chair;with call bell/phone within reach   Time: 0827-0900 OT Time Calculation (min): 33 min Charges:  OT General Charges $OT Visit: 1 Procedure OT  Evaluation $Initial OT Evaluation Tier I: 1 Procedure OT Treatments $Self Care/Home Management : 8-22 mins  Almon Register [id number] 02/05/2015, 1:20 PM

## 2015-02-05 NOTE — Progress Notes (Signed)
PULMONARY / CRITICAL CARE MEDICINE   Name: Pamela Mejia MRN: [id number] DOB: November 29, 1926    ADMISSION DATE:  02/02/2015 CONSULTATION DATE:  02/03/15  REFERRING MD :  Loleta Books  CHIEF COMPLAINT:  Abd pain  INITIAL PRESENTATION:  81 y.o. F from independent living, brought to [facility] ED 11/8 for abd pain.  Found to have bowel perf (perf diverticulitis) with pneumoperitoneum.  Admitted by [facility] but developed hypotension; therefore, PCCM called in consultation. Taken to OR 11/9.    STUDIES:  CT A/P 11/8 >>> bowel perf with mod pneumoperitoneum, suspected due to perforated colonic diverticulum.  Large hiatal hernia.  SIGNIFICANT EVENTS: 11/8 - admitted with bowel perf. 11/9 OR>>> sigmoid colectomy with colostomy 11/10  Off pressors   SUBJECTIVE:  C/o abd soreness.  Denies SOB.  Mouth dry  VITAL SIGNS: Temp:  [97.3 F (36.3 C)-98.5 F (36.9 C)] 97.3 F (36.3 C) (11/11 0800) Pulse Rate:  [49-73] 53 (11/11 0800) Resp:  [10-31] 23 (11/11 0800) BP: (95-133)/(41-51) 133/46 mmHg (11/11 0800) SpO2:  [91 %-95 %] 95 % (11/11 0800) Weight:  [73.1 kg (161 lb 2.5 oz)] 73.1 kg (161 lb 2.5 oz) (11/11 0500) HEMODYNAMICS:   VENTILATOR SETTINGS:   INTAKE / OUTPUT: Intake/Output      11/10 0701 - 11/11 0700 11/11 0701 - 11/12 0700   I.V. (mL/kg) 2875 (39.3)    IV Piggyback 400    Total Intake(mL/kg) 3275 (44.8)    Urine (mL/kg/hr) 930 (0.5)    Stool     Blood     Total Output 930     Net +2345            PHYSICAL EXAMINATION: General: Elderly female, in NAD. Neuro: A&O x 3, non-focal.  HEENT: [place]/AT. PERRL, sclerae anicteric. Cardiovascular: RRR, no M/R/G.  Lungs: Respirations even and unlabored. CTA Abdomen: -bs, L colostomy, midline incision with clean dressing Musculoskeletal: No gross deformities, no edema.  Skin: Intact, warm, no rashes.  LABS:  CBC  Recent Labs Lab 02/03/15 2125 02/04/15 0345 02/05/15 0340  WBC 12.2* 11.2* 11.4*  HGB 11.9* 10.3* 9.5*  HCT 37.3 32.1* 28.3*   PLT 185 147* 154   Coag's No results for input(s): APTT, INR in the last 168 hours. BMET  Recent Labs Lab 02/03/15 1935 02/04/15 0641 02/05/15 0710  NA 139 139 142  K 4.3 3.7 3.3*  CL 114* 118* 117*  CO2 17* 16* 18*  BUN 16 19 27*  CREATININE 0.91 0.96 1.01*  GLUCOSE 85 84 104*   Electrolytes  Recent Labs Lab 02/03/15 1935 02/04/15 0641 02/05/15 0340 02/05/15 0710  CALCIUM 7.7* 8.0*  --  8.8*  MG  --  1.1* 2.2  --   PHOS  --  3.2 3.1  --    Sepsis Markers  Recent Labs Lab 02/03/15 0500 02/03/15 0840 02/03/15 1935 02/04/15 0345 02/05/15 0340  LATICACIDVEN 1.6 1.8 1.9  --   --   PROCALCITON  --  3.14  --  4.73 3.27   ABG No results for input(s): PHART, PCO2ART, PO2ART in the last 168 hours. Liver Enzymes  Recent Labs Lab 02/02/15 2249 02/03/15 0500 02/04/15 0641  AST 18 90* 53*  ALT 24 65* 93*  ALKPHOS 72 71 66  BILITOT 1.3* 2.0* 1.2  ALBUMIN 3.0* 2.2* 1.8*   Cardiac Enzymes  Recent Labs Lab 02/02/15 2248  TROPONINI <0.03   Glucose No results for input(s): GLUCAP in the last 168 hours.  Imaging No results found. ASSESSMENT / PLAN:  GASTROINTESTINAL A:   Bowel perforation with moderate pneumoperitoneum - s/p surgical repair 11/9 Transaminitis. Nutrition. P:   Surgery managing  NPO except ice chips   INFECTIOUS A:   Peritonitis due to bowel perforation. P:   BCx2 11/8 >ng UCx 11/8 > ng Peritoneal fluid 11/9>>>  GNR, GPC>>>  Levaquin 11/9>>>11/9 Flagyl 11/9>>>off  Meropenem 11/9>>> Vanc 11/9>>> anidulafungin 11/10>>>  trend pct  Can stop anti fungal once clinical improvement  PULMONARY A: At risk atelectasis due to splinting. Hx Asthma. P:   Incentive spirometry. Budesonide / Brovana in lieu of outpatient Advair. Mobilize   CARDIOVASCULAR A:  Septic shock - resolved Hx HTN. P:  Gentle volume  Hold outpatient amlodipine, hydrochlorothiazide, losartan. Dc Stress steroids -- was on slow steroid taper as  outpt for bronchitis  RENAL A:   AKI - likely pre-renal from decreased PO intake - improved.  Hypocalcemia - corrects to 8.8. HypoMg P:   Replete lytes as needed  HEMATOLOGIC A:   Mild anemia. VTE Prophylaxis. P:  Transfuse for Hgb < 7. SCD's / Heparin.   ENDOCRINE A:   Hypothyroidism.   P:   Continue outpatient synthroid, change to PO when able  NEUROLOGIC A:   Pain secondary to bowel perforation. P:   Fentanyl PRN. Dilaudid d/c'd.   Family updated: No family available 11/10   OK to transfer to floor, Transfer to [facility] 11/12  White. MD [id number]  02/05/2015  9:09 AM

## 2015-02-05 NOTE — Consult Note (Signed)
WOC ostomy follow up Stoma type/location: LLQ Colostomy Stomal assessment/size: Per yesterday Peristomal assessment: not seen today, pouch applied yesterday is intact Treatment options for stomal/peristomal skin: None Output: flatus only.  No stool, not serosanguinous drainage since yesterday Ostomy pouching: 1pc.flat flexible pouching system with skin barrier ring is intact.  Education provided: Limited today; patient has just had a breathing treatment and is coughing, uncomfortable, short of breath. Enrolled patient in [place] Start Discharge program: No, as supply needed once stool resumption has not yet been determined. She has signed a signature card and I have mailed. Two types of pouches (3 of each) and an educational booklet (and a clamp) are in a bag in the bath basin in the cupboard over her sink. A pressure redistribution chair pad is provided as she will be getting up more over the weekend. [place] nursing team will remain available to this patient, the nursing and medical teams.  No visits are planned over the weekend.  I will be out of the facility the first part of the week and one of my partners will follow this patient in my absence. OT has recommended SNF level of care for a time following post acute care admission. Thanks, Maudie Flakes, MSN, RN, [place], Arther Abbott  Pager# [phone]

## 2015-02-05 NOTE — Evaluation (Signed)
Physical Therapy Evaluation Patient Details Name: Pamela Mejia MRN: [id number] DOB: [birth year] Today's Date: 02/05/2015   History of Present Illness  Pamela Mejia is a 81 y.o. female with a past medical history significant for HTN, asthma persistent, and hypothyroidism who presents with acute abdominal pain; CT of the abdomen and pelvis showed pneumoperitoneum with presumed perforated diverticulitis. 11/9 Diagnostic laparoscopy, sigmoid colectomy with takedown of splenic flexure and end colostomy   Clinical Impression  Pt admitted with above diagnosis. Pt currently with functional limitations due to the deficits listed below (see PT Problem List). Pt will benefit from skilled PT to increase their independence and safety with mobility to allow discharge to the venue listed below.   Pt mobilizing well POD #2 and may require ST-SNF prior to return to ILF however will continue to monitor progress.     Follow Up Recommendations SNF    Equipment Recommendations  Rolling walker with 5" wheels    Recommendations for Other Services       Precautions / Restrictions Precautions Precautions: Fall Precaution Comments: monitor vitals Restrictions Weight Bearing Restrictions: No      Mobility  Bed Mobility Overal bed mobility: Needs Assistance Bed Mobility: Supine to Sit     Supine to sit: Min assist;HOB elevated     General bed mobility comments: assist for trunk upright due to abdominal pain  Transfers Overall transfer level: Needs assistance Equipment used: Rolling walker (2 wheeled) Transfers: Sit to/from Stand Sit to Stand: Min assist Stand pivot transfers: Min assist       General transfer comment: verbal cues for hand placement  Ambulation/Gait Ambulation/Gait assistance: Min guard Ambulation Distance (Feet): 200 Feet Assistive device: Rolling walker (2 wheeled) Gait Pattern/deviations: Step-through pattern;Decreased stride length;Trunk flexed     General Gait  Details: verbal cues for RW positioning and posture, required one standing rest break due to fatigue and SOB, SPO2 97% on 3L O2 during ambulation, HR 107 bpm  Stairs            Wheelchair Mobility    Modified Rankin (Stroke Patients Only)       Balance Overall balance assessment: Needs assistance Sitting-balance support: Feet supported;No upper extremity supported Sitting balance-Leahy Scale: Fair     Standing balance support: Single extremity supported Standing balance-Leahy Scale: Poor                               Pertinent Vitals/Pain Pain Assessment: Faces Faces Pain Scale: Hurts little more Pain Location: abdomen with mobility/coughing Pain Descriptors / Indicators: Aching;Sore Pain Intervention(s): Limited activity within patient's tolerance;Monitored during session;Repositioned    Home Living Family/patient expects to be discharged to:: Skilled nursing facility Living Arrangements: Alone Available Help at Discharge:  (has a button/string to push/pull if she needs help) Type of Home: Independent living facility       Home Layout: One level Home Equipment: None Additional Comments: has "walking stick"    Prior Function Level of Independence: Independent         Comments: performing all basic ADLs and IADLs pta     Hand Dominance   Dominant Hand: Right    Extremity/Trunk Assessment   Upper Extremity Assessment: Overall WFL for tasks assessed           Lower Extremity Assessment: Overall WFL for tasks assessed         Communication   Communication: No difficulties  Cognition Arousal/Alertness: Awake/alert Behavior During Therapy: [facility] for  tasks assessed/performed Overall Cognitive Status: Within Functional Limits for tasks assessed                      General Comments      Exercises        Assessment/Plan    PT Assessment Patient needs continued PT services  PT Diagnosis Difficulty walking   PT  Problem List Decreased strength;Decreased activity tolerance;Decreased mobility;Cardiopulmonary status limiting activity;Decreased knowledge of use of DME  PT Treatment Interventions DME instruction;Gait training;Functional mobility training;Patient/family education;Therapeutic activities;Therapeutic exercise   PT Goals (Current goals can be found in the Care Plan section) Acute Rehab PT Goals Patient Stated Goal: to get back to independent again PT Goal Formulation: With patient Time For Goal Achievement: 02/19/15 Potential to Achieve Goals: Good    Frequency Min 3X/week   Barriers to discharge        Co-evaluation               End of Session Equipment Utilized During Treatment: Gait belt;Oxygen Activity Tolerance: Patient tolerated treatment well Patient left: in chair;with call bell/phone within reach           Time: [id number] PT Time Calculation (min) (ACUTE ONLY): 18 min   Charges:   PT Evaluation $Initial PT Evaluation Tier I: 1 Procedure     PT G Codes:        Maclain Cohron,KATHrine E 02/05/2015, 3:52 PM Carmelia Bake, PT, DPT 02/05/2015 Pager: [phone]

## 2015-02-05 NOTE — Progress Notes (Signed)
Report called to Elnita Maxwell to go to room 1505. Irven Baltimore, RN

## 2015-02-06 LAB — CBC
HCT: 28 % — ABNORMAL LOW (ref 36.0–46.0)
Hemoglobin: 9 g/dL — ABNORMAL LOW (ref 12.0–15.0)
MCH: 29.9 pg (ref 26.0–34.0)
MCHC: 32.1 g/dL (ref 30.0–36.0)
MCV: 93 fL (ref 78.0–100.0)
Platelets: 166 10*3/uL (ref 150–400)
RBC: 3.01 MIL/uL — ABNORMAL LOW (ref 3.87–5.11)
RDW: 15.3 % (ref 11.5–15.5)
WBC: 10.9 10*3/uL — ABNORMAL HIGH (ref 4.0–10.5)

## 2015-02-06 LAB — BASIC METABOLIC PANEL
Anion gap: 4 — ABNORMAL LOW (ref 5–15)
BUN: 28 mg/dL — AB (ref 6–20)
CALCIUM: 8.8 mg/dL — AB (ref 8.9–10.3)
CO2: 20 mmol/L — AB (ref 22–32)
CREATININE: 0.98 mg/dL (ref 0.44–1.00)
Chloride: 117 mmol/L — ABNORMAL HIGH (ref 101–111)
GFR calc non Af Amer: 51 mL/min — ABNORMAL LOW (ref >60)
GFR, EST AFRICAN AMERICAN: 59 mL/min — AB (ref >60)
Glucose, Bld: 105 mg/dL — ABNORMAL HIGH (ref 65–99)
Potassium: 4.1 mmol/L (ref 3.5–5.1)
SODIUM: 141 mmol/L (ref 135–145)

## 2015-02-06 MED ORDER — LEVOTHYROXINE SODIUM 25 MCG PO TABS
25.0000 ug | ORAL_TABLET | Freq: Every day | ORAL | Status: DC
  Administered 2015-02-08 – 2015-02-10 (×3): 25 ug via ORAL
  Filled 2015-02-06 (×5): qty 1

## 2015-02-06 NOTE — Progress Notes (Signed)
ANTIBIOTIC CONSULT NOTE - Follow up  Pharmacy Consult for Anidulafungin, Meropenem Indication: Intra-abdominal infection  Allergies  Allergen Reactions  . Penicillins Shortness Of Breath    Patient is unable to answer PCN questions at this time.   . Tylenol With Codeine #3 [Acetaminophen-Codeine]     unknown   Patient Measurements: Height: 5' (152.4 cm) Weight: 161 lb 2.5 oz (73.1 kg) IBW/kg (Calculated) : 45.5  Vital Signs: Temp: 97.3 F (36.3 C) (11/12 0537) Temp Source: Oral (11/12 0537) BP: 145/53 mmHg (11/12 0537) Pulse Rate: 54 (11/12 0537) Intake/Output from previous day: 11/11 0701 - 11/12 0700 In: 1149.2 [P.O.:120; I.V.:829.2; IV Piggyback:200] Out: 350 [Urine:350] Intake/Output from this shift: Total I/O In: 363 [P.O.:360; I.V.:3] Out: -   Labs:  Recent Labs  02/04/15 0345  02/05/15 0340 02/05/15 0710 02/05/15 1350 02/06/15 0525  WBC 11.2*  --  11.4*  --   --  10.9*  HGB 10.3*  --  9.5*  --   --  9.0*  PLT 147*  --  154  --   --  166  CREATININE  --   < >  --  1.01* 1.08* 0.98  < > = values in this interval not displayed. Estimated Creatinine Clearance: 36.8 mL/min (by C-G formula based on Cr of 0.98).  Recent Labs  02/05/15 0710  Lyman 12    Microbiology: Recent Results (from the past 720 hour(s))  Blood Culture (routine x 2)     Status: None (Preliminary result)   Collection Time: 02/03/15 12:47 AM  Result Value Ref Range Status   Specimen Description BLOOD RIGHT ANTECUBITAL  Final   Special Requests IN PEDIATRIC BOTTLE 2ML  Final   Culture   Final    NO GROWTH 3 DAYS Performed at [facility]    Report Status PENDING  Incomplete  Blood Culture (routine x 2)     Status: None (Preliminary result)   Collection Time: 02/03/15 12:50 AM  Result Value Ref Range Status   Specimen Description BLOOD LEFT ANTECUBITAL  Final   Special Requests BOTTLES DRAWN AEROBIC AND ANAEROBIC 5ML  Final   Culture   Final    NO GROWTH 3  DAYS Performed at [facility]    Report Status PENDING  Incomplete  MRSA PCR Screening     Status: None   Collection Time: 02/03/15  4:17 AM  Result Value Ref Range Status   MRSA by PCR NEGATIVE NEGATIVE Final    Comment:        The GeneXpert MRSA Assay (FDA approved for NASAL specimens only), is one component of a comprehensive MRSA colonization surveillance program. It is not intended to diagnose MRSA infection nor to guide or monitor treatment for MRSA infections.   Urine culture     Status: None   Collection Time: 02/03/15 10:20 AM  Result Value Ref Range Status   Specimen Description URINE, CATHETERIZED  Final   Special Requests NONE  Final   Culture   Final    NO GROWTH 1 DAY Performed at [facility]    Report Status 02/04/2015 FINAL  Final  Anaerobic culture     Status: None (Preliminary result)   Collection Time: 02/03/15  5:06 PM  Result Value Ref Range Status   Specimen Description PERITONEAL  Final   Special Requests NONE  Final   Gram Stain   Final    MODERATE WBC PRESENT,BOTH PMN AND MONONUCLEAR NO SQUAMOUS EPITHELIAL CELLS SEEN RARE GRAM POSITIVE COCCI IN  PAIRS Performed at [facility]   Final    NO ANAEROBES ISOLATED; CULTURE IN PROGRESS FOR 5 DAYS Performed at [facility]    Report Status PENDING  Incomplete  Body fluid culture     Status: None (Preliminary result)   Collection Time: 02/03/15  5:06 PM  Result Value Ref Range Status   Specimen Description FLUID PERITONEAL  Final   Special Requests SWAB  Final   Gram Stain   Final    MODERATE WBC PRESENT,BOTH PMN AND MONONUCLEAR GRAM NEGATIVE RODS GRAM POSITIVE RODS GRAM POSITIVE COCCI IN CHAINS IN PAIRS YEAST    Culture   Final    RARE GRAM POSITIVE RODS RARE STREPTOCOCCUS GROUP D;high probability for S.bovis PENICILLIN MIC TO FOLLOW RARE YEAST CULTURE REINCUBATED FOR BETTER GROWTH    Report Status PENDING  Incomplete  Culture, blood (single)      Status: None (Preliminary result)   Collection Time: 02/03/15  7:35 PM  Result Value Ref Range Status   Specimen Description BLOOD LEFT HAND  Final   Special Requests IN PEDIATRIC BOTTLE  .5CC  Final   Culture   Final    NO GROWTH 3 DAYS Performed at [facility]    Report Status PENDING  Incomplete   Medical History: Past Medical History  Diagnosis Date  . Asthma   . Hypothyroidism   . Hypertension   . Cancer ([place])     Ocular melanoma   Assessment: 8 yoF admitted on 11/9 with abdominal pain; CT abdomen/pelvis showed pneumoperitoneum with presumed perforated diverticulitis.  PMH is significant for diverticulitis and multiple abdominal surgeries.  She is now s/p lap sigmoid colectomy with takedown of splenic flexure and end colostomy.  She was initially started on broad spectrum antibiotics, but now anti-infectives are changed to Meropenem, Anidulafungin, Vancomycin, pharmacy to dose  11/9 >> Vancomycin >> 11/11 11/9 >> Aztreonam >> 11/9 11/9 >> Levaquin >> 11/9 11/9 >> Metronidazole >> 11/9 11/9 >> Meropenem >> 11/9 >> Anidulafungin >>  Today, 02/06/2015:  Afebrile, WBC 10.9, SCr remains ~1, Cl ~ 37BN  Blood cx: no growth, urine cx: no growth-final, and peritoneal fluid cultures with GNR, GPC, re-incubated  Goal of Therapy:  Appropriate abx dose/schedule, eradication of infection.   Plan:   No change Meropenem 1g IV q12h  No change Anidulafungin 200mg  IV once, then 100mg  IV q24h  Follow up renal fxn, culture results, and clinical course.  Minda Ditto PharmD Pager [phone] 02/06/2015, 11:28 AM

## 2015-02-06 NOTE — Progress Notes (Signed)
3 Days Post-Op  Subjective: Tolerating clear liquids.  Up on floor now.  Objective: Vital signs in last 24 hours: Temp:  [97.3 F (36.3 C)-97.7 F (36.5 C)] 97.3 F (36.3 C) (11/12 0537) Pulse Rate:  [46-65] 54 (11/12 0537) Resp:  [19-26] 20 (11/12 0537) BP: (125-158)/(48-57) 145/53 mmHg (11/12 0537) SpO2:  [93 %-100 %] 98 % (11/12 0828) FiO2 (%):  [3 %] 3 % (11/12 0537) Last BM Date: 02/01/15  Intake/Output from previous day: 11/11 0701 - 11/12 0700 In: 1149.2 [P.O.:120; I.V.:829.2; IV Piggyback:200] Out: 350 [Urine:350] Intake/Output this shift: Total I/O In: 360 [P.O.:360] Out: -   PE: General- In NAD Abdomen-soft, open wound is clean, ostomy pink with some gas in bag.  Lab Results:   Recent Labs  02/05/15 0340 02/06/15 0525  WBC 11.4* 10.9*  HGB 9.5* 9.0*  HCT 28.3* 28.0*  PLT 154 166   BMET  Recent Labs  02/05/15 1350 02/06/15 0525  NA 144 141  K 3.8 4.1  CL 118* 117*  CO2 19* 20*  GLUCOSE 106* 105*  BUN 28* 28*  CREATININE 1.08* 0.98  CALCIUM 9.3 8.8*   PT/INR No results for input(s): LABPROT, INR in the last 72 hours. Comprehensive Metabolic Panel:    Component Value Date/Time   NA 141 02/06/2015 0525   NA 144 02/05/2015 1350   K 4.1 02/06/2015 0525   K 3.8 02/05/2015 1350   CL 117* 02/06/2015 0525   CL 118* 02/05/2015 1350   CO2 20* 02/06/2015 0525   CO2 19* 02/05/2015 1350   BUN 28* 02/06/2015 0525   BUN 28* 02/05/2015 1350   CREATININE 0.98 02/06/2015 0525   CREATININE 1.08* 02/05/2015 1350   GLUCOSE 105* 02/06/2015 0525   GLUCOSE 106* 02/05/2015 1350   CALCIUM 8.8* 02/06/2015 0525   CALCIUM 9.3 02/05/2015 1350   AST 53* 02/04/2015 0641   AST 90* 02/03/2015 0500   ALT 93* 02/04/2015 0641   ALT 65* 02/03/2015 0500   ALKPHOS 66 02/04/2015 0641   ALKPHOS 71 02/03/2015 0500   BILITOT 1.2 02/04/2015 0641   BILITOT 2.0* 02/03/2015 0500   PROT 4.0* 02/04/2015 0641   PROT 4.3* 02/03/2015 0500   ALBUMIN 1.8* 02/04/2015 0641   ALBUMIN 2.2* 02/03/2015 0500     Studies/Results: No results found.  Anti-infectives: Anti-infectives    Start     Dose/Rate Route Frequency Ordered Stop   02/04/15 2359  levofloxacin (LEVAQUIN) IVPB 750 mg  Status:  Discontinued     750 mg 100 mL/hr over 90 Minutes Intravenous Every 48 hours 02/03/15 0149 02/03/15 0359   02/04/15 2359  levofloxacin (LEVAQUIN) IVPB 750 mg  Status:  Discontinued     750 mg 100 mL/hr over 90 Minutes Intravenous Every 48 hours 02/03/15 0404 02/03/15 0948   02/04/15 1800  anidulafungin (ERAXIS) 100 mg in sodium chloride 0.9 % 100 mL IVPB     100 mg over 90 Minutes Intravenous Every 24 hours 02/03/15 1844     02/04/15 0000  levofloxacin (LEVAQUIN) IVPB 750 mg  Status:  Discontinued     750 mg 100 mL/hr over 90 Minutes Intravenous Every 48 hours 02/03/15 1847 02/03/15 1852   02/03/15 2359  vancomycin (VANCOCIN) IVPB 1000 mg/200 mL premix  Status:  Discontinued     1,000 mg 200 mL/hr over 60 Minutes Intravenous Every 24 hours 02/03/15 0215 02/03/15 0359   02/03/15 2000  meropenem (MERREM) 1 g in sodium chloride 0.9 % 100 mL IVPB     1  g 200 mL/hr over 30 Minutes Intravenous Every 12 hours 02/03/15 1856     02/03/15 2000  vancomycin (VANCOCIN) 500 mg in sodium chloride 0.9 % 100 mL IVPB  Status:  Discontinued     500 mg 100 mL/hr over 60 Minutes Intravenous Every 12 hours 02/03/15 1856 02/05/15 0838   02/03/15 1845  anidulafungin (ERAXIS) 200 mg in sodium chloride 0.9 % 200 mL IVPB     200 mg over 180 Minutes Intravenous STAT 02/03/15 1844 02/03/15 2214   02/03/15 1030  [MAR Hold]  meropenem (MERREM) 500 mg in sodium chloride 0.9 % 50 mL IVPB  Status:  Discontinued     (MAR Hold since 02/03/15 1435)   500 mg 100 mL/hr over 30 Minutes Intravenous 2 times daily 02/03/15 1004 02/03/15 1856   02/03/15 1000  aztreonam (AZACTAM) 2 g in dextrose 5 % 50 mL IVPB  Status:  Discontinued     2 g 100 mL/hr over 30 Minutes Intravenous Every 8 hours 02/03/15 0149  02/03/15 0359   02/03/15 1000  ertapenem (INVANZ) 1 g in sodium chloride 0.9 % 50 mL IVPB  Status:  Discontinued     1 g 100 mL/hr over 30 Minutes Intravenous Every 24 hours 02/03/15 0948 02/03/15 1003   02/03/15 0415  [MAR Hold]  metroNIDAZOLE (FLAGYL) IVPB 500 mg  Status:  Discontinued     (MAR Hold since 02/03/15 1435)   500 mg 100 mL/hr over 60 Minutes Intravenous 3 times per day 02/03/15 0359 02/03/15 1852   02/03/15 0400  levofloxacin (LEVAQUIN) IVPB 750 mg  Status:  Discontinued     750 mg 100 mL/hr over 90 Minutes Intravenous  Once 02/03/15 0359 02/03/15 0402   02/03/15 0030  levofloxacin (LEVAQUIN) IVPB 750 mg     750 mg 100 mL/hr over 90 Minutes Intravenous  Once 02/03/15 0022 02/03/15 0231   02/03/15 0030  aztreonam (AZACTAM) 2 g in dextrose 5 % 50 mL IVPB     2 g 100 mL/hr over 30 Minutes Intravenous  Once 02/03/15 0022 02/03/15 0131   02/03/15 0030  vancomycin (VANCOCIN) IVPB 1000 mg/200 mL premix     1,000 mg 200 mL/hr over 60 Minutes Intravenous  Once 02/03/15 0022 02/03/15 0229      Assessment Principal Problem:  Perforated diverticulitis s/p laparoscopic assisted Hartmann procedure 02/03/15 (Dr. Maryln Manuel to make slow progress.   LOS: 3 days   Plan: Advance to full liquid diet.   Pamela Mejia J 02/06/2015

## 2015-02-06 NOTE — Progress Notes (Signed)
[facility] PROGRESS NOTE  Pamela Mejia [id number] DOB: 02-May-1926 DOA: 02/02/2015 PCP: Myriam Jacobson, MD Brief history: 81 y.o. F from independent living, brought to [facility] ED 11/8 for abd pain. Found to have bowel perf (perf diverticulitis) with pneumoperitoneum. Admitted by [facility] but developed hypotension; therefore, PCCM called in consultation. Taken to OR 11/9. She underwent sigmoid colectomy with colostomy. She was starte don pressors by PCCM and discontinued on 11/10. She was transferred to [facility] service on 11/12.  Assessment/Plan: 1. Diverticulitis , with bowel perforation: s/p surgical repair on 11/9, with colectomy and colostomy.  - she was initially started on levaquin and flagyl which were discontinued, later on started on vancomycin and meropenam. She was started on clear liquids and advanced to full liquid today. She tolerated the diet without any issues. No nausea or vomited.   2. Septic shock: resolved with fluid resuscitation and pressors.  On gentle hydration and off pressors.  procalcitonin improving.  Leukocytosis is improving.  Cultures have been negative.   3. Hypothyroidism: Resume synthroid.   4. COPD: No wheezing.   5. Anemia:  Anemia of blood loss from the surgery. Stable. Monitor.   6. Hypokalemia replete as needed.   Code Status: DNR Family Communication: none at bedside.  Disposition Plan: possibly to rehab when able to tolerate a regular diet.    Consultants:  Surgery  PCCM. AS PRIMARY till 11/11  Procedures:  Sigmoid colectomy on 11/9  Antibiotics:  meropenam 11/9  HPI/Subjective: Pain controlled. Nausea, vomiting.   Objective: Filed Vitals:   02/06/15 0537  BP: 145/53  Pulse: 54  Temp: 97.3 F (36.3 C)  Resp: 20    Intake/Output Summary (Last 24 hours) at 02/06/15 1229 Last data filed at 02/06/15 1000  Gross per 24 hour  Intake    883 ml  Output    350 ml  Net    533 ml   Filed Weights   02/03/15 0500  02/03/15 1948 02/05/15 0500  Weight: 67.9 kg (149 lb 11.1 oz) 67.9 kg (149 lb 11.1 oz) 73.1 kg (161 lb 2.5 oz)    Exam:   General:  Alert and comfortable  Cardiovascular: s1s2  Respiratory: diminished at bases, no wheezing or rhonchi  Abdomen: soft inicision with clean dressing, no bleeding seen.colostomy   Musculoskeletal: no pedal edema,   Data Reviewed: Basic Metabolic Panel:  Recent Labs Lab 02/03/15 1935 02/04/15 0641 02/05/15 0340 02/05/15 0710 02/05/15 1350 02/06/15 0525  NA 139 139  --  142 144 141  K 4.3 3.7  --  3.3* 3.8 4.1  CL 114* 118*  --  117* 118* 117*  CO2 17* 16*  --  18* 19* 20*  GLUCOSE 85 84  --  104* 106* 105*  BUN 16 19  --  27* 28* 28*  CREATININE 0.91 0.96  --  1.01* 1.08* 0.98  CALCIUM 7.7* 8.0*  --  8.8* 9.3 8.8*  MG  --  1.1* 2.2  --   --   --   PHOS  --  3.2 3.1  --   --   --    Liver Function Tests:  Recent Labs Lab 02/02/15 2249 02/03/15 0500 02/04/15 0641  AST 18 90* 53*  ALT 24 65* 93*  ALKPHOS 72 71 66  BILITOT 1.3* 2.0* 1.2  PROT 5.5* 4.3* 4.0*  ALBUMIN 3.0* 2.2* 1.8*   No results for input(s): LIPASE, AMYLASE in the last 168 hours. No results for input(s): AMMONIA in the last 168 hours.  CBC:  Recent Labs Lab 02/02/15 2249 02/03/15 0500 02/03/15 2125 02/04/15 0345 02/05/15 0340 02/06/15 0525  WBC 24.5* 22.3* 12.2* 11.2* 11.4* 10.9*  NEUTROABS 23.0*  --  11.7*  --   --   --   HGB 12.8 10.5* 11.9* 10.3* 9.5* 9.0*  HCT 39.4 32.9* 37.3 32.1* 28.3* 28.0*  MCV 94.0 95.6 95.4 95.0 93.7 93.0  PLT 219 188 185 147* 154 166   Cardiac Enzymes:  Recent Labs Lab 02/02/15 2248  TROPONINI <0.03   BNP (last 3 results) No results for input(s): BNP in the last 8760 hours.  ProBNP (last 3 results) No results for input(s): PROBNP in the last 8760 hours.  CBG: No results for input(s): GLUCAP in the last 168 hours.  Recent Results (from the past 240 hour(s))  Blood Culture (routine x 2)     Status: None (Preliminary  result)   Collection Time: 02/03/15 12:47 AM  Result Value Ref Range Status   Specimen Description BLOOD RIGHT ANTECUBITAL  Final   Special Requests IN PEDIATRIC BOTTLE 2ML  Final   Culture   Final    NO GROWTH 3 DAYS Performed at [facility]    Report Status PENDING  Incomplete  Blood Culture (routine x 2)     Status: None (Preliminary result)   Collection Time: 02/03/15 12:50 AM  Result Value Ref Range Status   Specimen Description BLOOD LEFT ANTECUBITAL  Final   Special Requests BOTTLES DRAWN AEROBIC AND ANAEROBIC 5ML  Final   Culture   Final    NO GROWTH 3 DAYS Performed at [facility]    Report Status PENDING  Incomplete  MRSA PCR Screening     Status: None   Collection Time: 02/03/15  4:17 AM  Result Value Ref Range Status   MRSA by PCR NEGATIVE NEGATIVE Final    Comment:        The GeneXpert MRSA Assay (FDA approved for NASAL specimens only), is one component of a comprehensive MRSA colonization surveillance program. It is not intended to diagnose MRSA infection nor to guide or monitor treatment for MRSA infections.   Urine culture     Status: None   Collection Time: 02/03/15 10:20 AM  Result Value Ref Range Status   Specimen Description URINE, CATHETERIZED  Final   Special Requests NONE  Final   Culture   Final    NO GROWTH 1 DAY Performed at [facility]    Report Status 02/04/2015 FINAL  Final  Anaerobic culture     Status: None (Preliminary result)   Collection Time: 02/03/15  5:06 PM  Result Value Ref Range Status   Specimen Description PERITONEAL  Final   Special Requests NONE  Final   Gram Stain   Final    MODERATE WBC PRESENT,BOTH PMN AND MONONUCLEAR NO SQUAMOUS EPITHELIAL CELLS SEEN RARE GRAM POSITIVE COCCI IN PAIRS Performed at [facility]    Culture   Final    NO ANAEROBES ISOLATED; CULTURE IN PROGRESS FOR 5 DAYS Performed at [facility]    Report Status PENDING  Incomplete  Body fluid culture      Status: None (Preliminary result)   Collection Time: 02/03/15  5:06 PM  Result Value Ref Range Status   Specimen Description FLUID PERITONEAL  Final   Special Requests SWAB  Final   Gram Stain   Final    MODERATE WBC PRESENT,BOTH PMN AND MONONUCLEAR GRAM NEGATIVE RODS GRAM POSITIVE RODS GRAM POSITIVE COCCI IN  CHAINS IN PAIRS YEAST    Culture   Final    RARE GRAM POSITIVE RODS RARE STREPTOCOCCUS GROUP D;high probability for S.bovis PENICILLIN MIC TO FOLLOW RARE YEAST CULTURE REINCUBATED FOR BETTER GROWTH    Report Status PENDING  Incomplete  Culture, blood (single)     Status: None (Preliminary result)   Collection Time: 02/03/15  7:35 PM  Result Value Ref Range Status   Specimen Description BLOOD LEFT HAND  Final   Special Requests IN PEDIATRIC BOTTLE  .5CC  Final   Culture   Final    NO GROWTH 3 DAYS Performed at [facility]    Report Status PENDING  Incomplete     Studies: No results found.  Scheduled Meds: . anidulafungin  100 mg Intravenous Q24H  . antiseptic oral rinse  7 mL Mouth Rinse BID  . arformoterol  15 mcg Nebulization BID  . budesonide (PULMICORT) nebulizer solution  0.5 mg Nebulization BID  . heparin subcutaneous  5,000 Units Subcutaneous 3 times per day  . levothyroxine  12.5 mcg Intravenous Daily  . meropenem (MERREM) IV  1 g Intravenous Q12H  . sodium chloride  500 mL Intravenous Once  . sodium chloride  3 mL Intravenous Q12H   Continuous Infusions: . 0.9 % NaCl with KCl 40 mEq / L 75 mL/hr (02/06/15 1033)    Principal Problem:   [place] Active Problems:   Asthma, moderate persistent   Hypothyroidism   Essential hypertension    Time spent: 25 min    [facility] Pager [phone] If 7PM-7AM, please contact night-coverage at [email or web address], password [facility] 02/06/2015, 12:29 PM  LOS: 3 days

## 2015-02-07 ENCOUNTER — Inpatient Hospital Stay (HOSPITAL_COMMUNITY): Payer: Medicare Other

## 2015-02-07 LAB — CBC
HEMATOCRIT: 30.8 % — AB (ref 36.0–46.0)
HEMOGLOBIN: 9.9 g/dL — AB (ref 12.0–15.0)
MCH: 30.5 pg (ref 26.0–34.0)
MCHC: 32.1 g/dL (ref 30.0–36.0)
MCV: 94.8 fL (ref 78.0–100.0)
Platelets: 174 10*3/uL (ref 150–400)
RBC: 3.25 MIL/uL — ABNORMAL LOW (ref 3.87–5.11)
RDW: 15.7 % — AB (ref 11.5–15.5)
WBC: 7.5 10*3/uL (ref 4.0–10.5)

## 2015-02-07 LAB — BASIC METABOLIC PANEL
Anion gap: 4 — ABNORMAL LOW (ref 5–15)
BUN: 23 mg/dL — ABNORMAL HIGH (ref 6–20)
CHLORIDE: 119 mmol/L — AB (ref 101–111)
CO2: 21 mmol/L — AB (ref 22–32)
Calcium: 8.9 mg/dL (ref 8.9–10.3)
Creatinine, Ser: 0.82 mg/dL (ref 0.44–1.00)
GFR calc non Af Amer: >60 mL/min (ref >60)
GLUCOSE: 87 mg/dL (ref 65–99)
Potassium: 4.8 mmol/L (ref 3.5–5.1)
Sodium: 144 mmol/L (ref 135–145)

## 2015-02-07 MED ORDER — FUROSEMIDE 10 MG/ML IJ SOLN
20.0000 mg | Freq: Once | INTRAMUSCULAR | Status: AC
  Administered 2015-02-07: 20 mg via INTRAVENOUS
  Filled 2015-02-07: qty 2

## 2015-02-07 NOTE — Clinical Social Work Note (Signed)
Clinical Social Work Assessment  Patient Details  Name: Pamela Mejia MRN: [id number] Date of Birth: 01/18/1927  Date of referral:  02/07/15               Reason for consult:  Facility Placement                Permission sought to share information with:  Facility Sport and exercise psychologist, Family Supports Permission granted to share information::  Yes, Verbal Permission Granted  Name::        Agency::     Relationship::     Contact Information:     Housing/Transportation Living arrangements for the past 2 months:  [place] of Information:  Patient, Adult Children (son Pamela Mejia) Patient Interpreter Needed:  None Criminal Activity/Legal Involvement Pertinent to Current Situation/Hospitalization:    Significant Relationships:  Adult Children Lives with:  Self Do you feel safe going back to the place where you live?    Need for family participation in patient care:  Yes (Comment)  Care giving concerns:  No caregiver   Social Worker assessment / plan:  CSW met with pt at bedside to discuss discharge needs.  CSW asked permission to call pt's son to discuss discharge.  CSW faxed pt out to [facility] and will follow up with pt and son for a decision once medically stable.  Pt lives alone and if she does not gain her strength before discharge will need a SNF  Employment status:  Retired Forensic scientist:  Medicare PT Recommendations:  [place] / Referral to community resources:     Patient/Family's Response to care:  Pt tired and discussed living alone.  Pt stated it was ok to call her son to discuss discharge plans.  Pt has no history of rehab and was hoping that she would not need to go at discharge.  Pt's son stated that he wanted pt faxed out in case she needs SNF at discharge.  Pt's husband died last 05/10/22 and pt had been living with her children until a couple of weeks ago when she moved into independent living at [employer].   Pt's son  stated that pt just go settled in new place when she go sick.  He knows that she would prefer to have [facility] PT but stated that she is" intelligent" and knows that she will know if it will be a "necessity" for her to go to SNF at discharge  Patient/Family's Understanding of and Emotional Response to Diagnosis, Current Treatment, and Prognosis:  Pt tired and having difficulty with her breathing.  She could not provide much information.  Pt's son hopeful that pt will regain her strength before discharge and only need in home services.   Emotional Assessment Appearance:  Appears younger than stated age Attitude/Demeanor/Rapport:  Lethargic Affect (typically observed):  Quiet Orientation:  Oriented to Self, Oriented to Place, Oriented to  Time, Oriented to Situation Alcohol / Substance use:    Psych involvement (Current and /or in the community):  No (Comment)  Discharge Needs  Concerns to be addressed:    Readmission within the last 30 days:    Current discharge risk:    Barriers to Discharge:  No Barriers Identified   Carlean Jews, LCSW 02/07/2015, 3:20 PM

## 2015-02-07 NOTE — Progress Notes (Signed)
pulmicort and brovana given through flutter device

## 2015-02-07 NOTE — Progress Notes (Signed)
4 Days Post-Op  Subjective: Does not feel as well today.  Feels weak.  Working hard to breath.  Objective: Vital signs in last 24 hours: Temp:  [97.4 F (36.3 C)-97.9 F (36.6 C)] 97.4 F (36.3 C) (11/13 0446) Pulse Rate:  [57-71] 71 (11/13 0446) Resp:  [15-20] 16 (11/13 0446) BP: (131-165)/(56-67) 165/67 mmHg (11/13 0446) SpO2:  [99 %-100 %] 99 % (11/13 0446) Last BM Date: 02/06/15 (smear)  Intake/Output from previous day: 11/12 0701 - 11/13 0700 In: 963 [P.O.:960; I.V.:3] Out: -  Intake/Output this shift:    PE: General- Appears weak with increased work of breathing. Abdomen-soft, open wound is clean, ostomy pink with some stool in bag.  Lab Results:   Recent Labs  02/06/15 0525 02/07/15 0524  WBC 10.9* 7.5  HGB 9.0* 9.9*  HCT 28.0* 30.8*  PLT 166 174   BMET  Recent Labs  02/06/15 0525 02/07/15 0524  NA 141 144  K 4.1 4.8  CL 117* 119*  CO2 20* 21*  GLUCOSE 105* 87  BUN 28* 23*  CREATININE 0.98 0.82  CALCIUM 8.8* 8.9   PT/INR No results for input(s): LABPROT, INR in the last 72 hours. Comprehensive Metabolic Panel:    Component Value Date/Time   NA 144 02/07/2015 0524   NA 141 02/06/2015 0525   K 4.8 02/07/2015 0524   K 4.1 02/06/2015 0525   CL 119* 02/07/2015 0524   CL 117* 02/06/2015 0525   CO2 21* 02/07/2015 0524   CO2 20* 02/06/2015 0525   BUN 23* 02/07/2015 0524   BUN 28* 02/06/2015 0525   CREATININE 0.82 02/07/2015 0524   CREATININE 0.98 02/06/2015 0525   GLUCOSE 87 02/07/2015 0524   GLUCOSE 105* 02/06/2015 0525   CALCIUM 8.9 02/07/2015 0524   CALCIUM 8.8* 02/06/2015 0525   AST 53* 02/04/2015 0641   AST 90* 02/03/2015 0500   ALT 93* 02/04/2015 0641   ALT 65* 02/03/2015 0500   ALKPHOS 66 02/04/2015 0641   ALKPHOS 71 02/03/2015 0500   BILITOT 1.2 02/04/2015 0641   BILITOT 2.0* 02/03/2015 0500   PROT 4.0* 02/04/2015 0641   PROT 4.3* 02/03/2015 0500   ALBUMIN 1.8* 02/04/2015 0641   ALBUMIN 2.2* 02/03/2015 0500      Studies/Results: No results found.  Anti-infectives: Anti-infectives    Start     Dose/Rate Route Frequency Ordered Stop   02/04/15 2359  levofloxacin (LEVAQUIN) IVPB 750 mg  Status:  Discontinued     750 mg 100 mL/hr over 90 Minutes Intravenous Every 48 hours 02/03/15 0149 02/03/15 0359   02/04/15 2359  levofloxacin (LEVAQUIN) IVPB 750 mg  Status:  Discontinued     750 mg 100 mL/hr over 90 Minutes Intravenous Every 48 hours 02/03/15 0404 02/03/15 0948   02/04/15 1800  anidulafungin (ERAXIS) 100 mg in sodium chloride 0.9 % 100 mL IVPB     100 mg over 90 Minutes Intravenous Every 24 hours 02/03/15 1844     02/04/15 0000  levofloxacin (LEVAQUIN) IVPB 750 mg  Status:  Discontinued     750 mg 100 mL/hr over 90 Minutes Intravenous Every 48 hours 02/03/15 1847 02/03/15 1852   02/03/15 2359  vancomycin (VANCOCIN) IVPB 1000 mg/200 mL premix  Status:  Discontinued     1,000 mg 200 mL/hr over 60 Minutes Intravenous Every 24 hours 02/03/15 0215 02/03/15 0359   02/03/15 2000  meropenem (MERREM) 1 g in sodium chloride 0.9 % 100 mL IVPB     1 g 200 mL/hr over  30 Minutes Intravenous Every 12 hours 02/03/15 1856     02/03/15 2000  vancomycin (VANCOCIN) 500 mg in sodium chloride 0.9 % 100 mL IVPB  Status:  Discontinued     500 mg 100 mL/hr over 60 Minutes Intravenous Every 12 hours 02/03/15 1856 02/05/15 0838   02/03/15 1845  anidulafungin (ERAXIS) 200 mg in sodium chloride 0.9 % 200 mL IVPB     200 mg over 180 Minutes Intravenous STAT 02/03/15 1844 02/03/15 2214   02/03/15 1030  [MAR Hold]  meropenem (MERREM) 500 mg in sodium chloride 0.9 % 50 mL IVPB  Status:  Discontinued     (MAR Hold since 02/03/15 1435)   500 mg 100 mL/hr over 30 Minutes Intravenous 2 times daily 02/03/15 1004 02/03/15 1856   02/03/15 1000  aztreonam (AZACTAM) 2 g in dextrose 5 % 50 mL IVPB  Status:  Discontinued     2 g 100 mL/hr over 30 Minutes Intravenous Every 8 hours 02/03/15 0149 02/03/15 0359   02/03/15 1000   ertapenem (INVANZ) 1 g in sodium chloride 0.9 % 50 mL IVPB  Status:  Discontinued     1 g 100 mL/hr over 30 Minutes Intravenous Every 24 hours 02/03/15 0948 02/03/15 1003   02/03/15 0415  [MAR Hold]  metroNIDAZOLE (FLAGYL) IVPB 500 mg  Status:  Discontinued     (MAR Hold since 02/03/15 1435)   500 mg 100 mL/hr over 60 Minutes Intravenous 3 times per day 02/03/15 0359 02/03/15 1852   02/03/15 0400  levofloxacin (LEVAQUIN) IVPB 750 mg  Status:  Discontinued     750 mg 100 mL/hr over 90 Minutes Intravenous  Once 02/03/15 0359 02/03/15 0402   02/03/15 0030  levofloxacin (LEVAQUIN) IVPB 750 mg     750 mg 100 mL/hr over 90 Minutes Intravenous  Once 02/03/15 0022 02/03/15 0231   02/03/15 0030  aztreonam (AZACTAM) 2 g in dextrose 5 % 50 mL IVPB     2 g 100 mL/hr over 30 Minutes Intravenous  Once 02/03/15 0022 02/03/15 0131   02/03/15 0030  vancomycin (VANCOCIN) IVPB 1000 mg/200 mL premix     1,000 mg 200 mL/hr over 60 Minutes Intravenous  Once 02/03/15 0022 02/03/15 0229      Assessment Principal Problem:  Perforated diverticulitis s/p laparoscopic assisted Hartmann procedure 02/03/15 (Dr. Monico Hoar much weaker today with increased of breathing.  LOS: 4 days   Plan: No change in diet.  Medicine to evaluate decline in pulmonary status.   Taiz Bickle J 02/07/2015

## 2015-02-07 NOTE — Progress Notes (Signed)
Secretary reported to me that there is still no bed available in Stepdown for patient at this time.  Notified patient and son, Pamela Mejia, in the room.  Patient and Son have no questions at this time.

## 2015-02-07 NOTE — Progress Notes (Signed)
Still no bed available in stepdown for patient transfer.  Oncoming nurse and I spoke with patient, and patient reports "I would rather stay here tonight."  Notified physician, Pincus Sanes, and physician putting in order to d/c stepdown transfer.  Transfer may be readdressed tomorrow.  Patient reports OK to contact her son, Pamela Mejia, and report her staying on the unit tonight.  House supervisor notified, and House Supervisor will contact patient son.

## 2015-02-07 NOTE — Progress Notes (Signed)
Physician at bedside.  Putting in orders for patient.  Fluids discontinued.

## 2015-02-07 NOTE — Progress Notes (Signed)
[facility] PROGRESS NOTE  Pamela Mejia [id number] DOB: 07-Feb-1927 DOA: 02/02/2015 PCP: Myriam Jacobson, MD Brief history: 81 y.o. F from independent living, brought to [facility] ED 11/8 for abd pain. Found to have bowel perf (perf diverticulitis) with pneumoperitoneum. Admitted by [facility] but developed hypotension; therefore, PCCM called in consultation. Taken to OR 11/9. She underwent sigmoid colectomy with colostomy. She was starte don pressors by PCCM and discontinued on 11/10. She was transferred to [facility] service on 11/12.  Assessment/Plan: 1. Diverticulitis , with bowel perforation: s/p surgical repair on 11/9, with colectomy and colostomy.  - she was initially started on levaquin and flagyl which were discontinued, later on started on vancomycin and meropenam. She was started on clear liquids and advanced to full liquid today. She tolerated the diet without any issues. No nausea or vomited.   2. Septic shock: resolved with fluid resuscitation and pressors.  On gentle hydration and off pressors.  procalcitonin improving.  Leukocytosis is improving.  Cultures have been negative so far.  3. Hypothyroidism: Resume synthroid.   4. COPD: worsening wheezing and increased work of breathing on 11/13, she appears fluid overloaded too, with pedal edema and edema of the upper extremities, fluids were discontinued and she was given neb treatment. Stat portable CXR ordered. Her oxygen sats remain stable around 95% on 3lit of [place] oxygen.   5. Anemia:  Anemia of blood loss from the surgery. Stable. Monitor.   6. Hypokalemia replete as needed.   Code Status: DNR Family Communication: none at bedside.  Disposition Plan: possibly to rehab when able to tolerate a regular diet.    Consultants:  Surgery  PCCM. AS PRIMARY till 11/11  Procedures:  Sigmoid colectomy on 11/9  Antibiotics:  meropenam 11/9  HPI/Subjective: No nausea or vomiting, having some sob. No chest pain or coughing,  as per RN, she has not eaten any today or taken her oral meds.   Objective: Filed Vitals:   02/07/15 1014  BP:   Pulse:   Temp:   Resp: 22    Intake/Output Summary (Last 24 hours) at 02/07/15 1033 Last data filed at 02/07/15 0900  Gross per 24 hour  Intake    600 ml  Output    300 ml  Net    300 ml   Filed Weights   02/03/15 0500 02/03/15 1948 02/05/15 0500  Weight: 67.9 kg (149 lb 11.1 oz) 67.9 kg (149 lb 11.1 oz) 73.1 kg (161 lb 2.5 oz)    Exam:   General:  Tachypnic,   Cardiovascular: s1s2  Respiratory: diminished at bases, scattered wheezing and increased work of breathing.   Abdomen: soft inicision with clean dressing, no bleeding seen.colostomy   Musculoskeletal: pedal edema, bilateral upper extremity edema.   Data Reviewed: Basic Metabolic Panel:  Recent Labs Lab 02/04/15 0641 02/05/15 0340 02/05/15 0710 02/05/15 1350 02/06/15 0525 02/07/15 0524  NA 139  --  142 144 141 144  K 3.7  --  3.3* 3.8 4.1 4.8  CL 118*  --  117* 118* 117* 119*  CO2 16*  --  18* 19* 20* 21*  GLUCOSE 84  --  104* 106* 105* 87  BUN 19  --  27* 28* 28* 23*  CREATININE 0.96  --  1.01* 1.08* 0.98 0.82  CALCIUM 8.0*  --  8.8* 9.3 8.8* 8.9  MG 1.1* 2.2  --   --   --   --   PHOS 3.2 3.1  --   --   --   --  Liver Function Tests:  Recent Labs Lab 02/02/15 2249 02/03/15 0500 02/04/15 0641  AST 18 90* 53*  ALT 24 65* 93*  ALKPHOS 72 71 66  BILITOT 1.3* 2.0* 1.2  PROT 5.5* 4.3* 4.0*  ALBUMIN 3.0* 2.2* 1.8*   No results for input(s): LIPASE, AMYLASE in the last 168 hours. No results for input(s): AMMONIA in the last 168 hours. CBC:  Recent Labs Lab 02/02/15 2249  02/03/15 2125 02/04/15 0345 02/05/15 0340 02/06/15 0525 02/07/15 0524  WBC 24.5*  < > 12.2* 11.2* 11.4* 10.9* 7.5  NEUTROABS 23.0*  --  11.7*  --   --   --   --   HGB 12.8  < > 11.9* 10.3* 9.5* 9.0* 9.9*  HCT 39.4  < > 37.3 32.1* 28.3* 28.0* 30.8*  MCV 94.0  < > 95.4 95.0 93.7 93.0 94.8  PLT 219  < >  185 147* 154 166 174  < > = values in this interval not displayed. Cardiac Enzymes:  Recent Labs Lab 02/02/15 2248  TROPONINI <0.03   BNP (last 3 results) No results for input(s): BNP in the last 8760 hours.  ProBNP (last 3 results) No results for input(s): PROBNP in the last 8760 hours.  CBG: No results for input(s): GLUCAP in the last 168 hours.  Recent Results (from the past 240 hour(s))  Blood Culture (routine x 2)     Status: None (Preliminary result)   Collection Time: 02/03/15 12:47 AM  Result Value Ref Range Status   Specimen Description BLOOD RIGHT ANTECUBITAL  Final   Special Requests IN PEDIATRIC BOTTLE 2ML  Final   Culture   Final    NO GROWTH 3 DAYS Performed at [facility]    Report Status PENDING  Incomplete  Blood Culture (routine x 2)     Status: None (Preliminary result)   Collection Time: 02/03/15 12:50 AM  Result Value Ref Range Status   Specimen Description BLOOD LEFT ANTECUBITAL  Final   Special Requests BOTTLES DRAWN AEROBIC AND ANAEROBIC 5ML  Final   Culture   Final    NO GROWTH 3 DAYS Performed at [facility]    Report Status PENDING  Incomplete  MRSA PCR Screening     Status: None   Collection Time: 02/03/15  4:17 AM  Result Value Ref Range Status   MRSA by PCR NEGATIVE NEGATIVE Final    Comment:        The GeneXpert MRSA Assay (FDA approved for NASAL specimens only), is one component of a comprehensive MRSA colonization surveillance program. It is not intended to diagnose MRSA infection nor to guide or monitor treatment for MRSA infections.   Urine culture     Status: None   Collection Time: 02/03/15 10:20 AM  Result Value Ref Range Status   Specimen Description URINE, CATHETERIZED  Final   Special Requests NONE  Final   Culture   Final    NO GROWTH 1 DAY Performed at [facility]    Report Status 02/04/2015 FINAL  Final  Anaerobic culture     Status: None (Preliminary result)   Collection Time:  02/03/15  5:06 PM  Result Value Ref Range Status   Specimen Description PERITONEAL  Final   Special Requests NONE  Final   Gram Stain   Final    MODERATE WBC PRESENT,BOTH PMN AND MONONUCLEAR NO SQUAMOUS EPITHELIAL CELLS SEEN RARE GRAM POSITIVE COCCI IN PAIRS Performed at [facility]  Final    NO ANAEROBES ISOLATED; CULTURE IN PROGRESS FOR 5 DAYS Performed at [facility]    Report Status PENDING  Incomplete  Body fluid culture     Status: None (Preliminary result)   Collection Time: 02/03/15  5:06 PM  Result Value Ref Range Status   Specimen Description FLUID PERITONEAL  Final   Special Requests SWAB  Final   Gram Stain   Final    MODERATE WBC PRESENT,BOTH PMN AND MONONUCLEAR GRAM NEGATIVE RODS GRAM POSITIVE RODS GRAM POSITIVE COCCI IN CHAINS IN PAIRS YEAST    Culture   Final    RARE GRAM POSITIVE RODS RARE STREPTOCOCCUS GROUP D;high probability for S.bovis PENICILLIN MIC TO FOLLOW RARE YEAST CULTURE REINCUBATED FOR BETTER GROWTH    Report Status PENDING  Incomplete  Culture, blood (single)     Status: None (Preliminary result)   Collection Time: 02/03/15  7:35 PM  Result Value Ref Range Status   Specimen Description BLOOD LEFT HAND  Final   Special Requests IN PEDIATRIC BOTTLE  .5CC  Final   Culture   Final    NO GROWTH 3 DAYS Performed at [facility]    Report Status PENDING  Incomplete     Studies: No results found.  Scheduled Meds: . anidulafungin  100 mg Intravenous Q24H  . antiseptic oral rinse  7 mL Mouth Rinse BID  . arformoterol  15 mcg Nebulization BID  . budesonide (PULMICORT) nebulizer solution  0.5 mg Nebulization BID  . furosemide  20 mg Intravenous Once  . heparin subcutaneous  5,000 Units Subcutaneous 3 times per day  . levothyroxine  25 mcg Oral QAC breakfast  . meropenem (MERREM) IV  1 g Intravenous Q12H  . sodium chloride  500 mL Intravenous Once  . sodium chloride  3 mL Intravenous Q12H    Continuous Infusions:    Principal Problem:   Peritonitis (HCC) Active Problems:   Asthma, moderate persistent   Hypothyroidism   Essential hypertension    Time spent: 25 min    [facility] Pager [phone] If 7PM-7AM, please contact night-coverage at [email or web address], password [facility] 02/07/2015, 10:33 AM  LOS: 4 days

## 2015-02-07 NOTE — NC FL2 (Signed)
[place] LEVEL OF CARE SCREENING TOOL     IDENTIFICATION  Patient Name: Imagen Noguez Birthdate: Nov 20, 1926 Sex: female Admission Date (Current Location): 02/02/2015  [facility] and [place] Number: Herbalist and Address:  [facility],  [place] [address] [place]      Provider Number: [id number]  Attending Physician Name and Address:  Hosie Poisson, MD  Relative Name and Phone Number:       Current Level of Care: Hospital Recommended Level of Care: [place] Prior Approval Number:    Date Approved/Denied:   PASRR Number:   [id number]  Discharge Plan: SNF    Current Diagnoses: Patient Active Problem List   Diagnosis Date Noted  . Asthma, moderate persistent 02/03/2015  . Hypothyroidism 02/03/2015  . Essential hypertension 02/03/2015  . Peritonitis ([place]) 02/03/2015    Orientation ACTIVITIES/SOCIAL BLADDER RESPIRATION    Self, Time, Situation, Place    Continent O2 (As needed) (3 liters)  BEHAVIORAL SYMPTOMS/MOOD NEUROLOGICAL BOWEL NUTRITION STATUS      Continent Diet (full liquid)  PHYSICIAN VISITS COMMUNICATION OF NEEDS Height & Weight Skin    Verbally   161 lbs. Surgical wounds          AMBULATORY STATUS RESPIRATION    Supervision limited O2 (As needed) (3 liters)      Personal Care Assistance Level of Assistance  Bathing, Dressing Bathing Assistance: Limited assistance   Dressing Assistance: Limited assistance      Functional Limitations Info                SPECIAL CARE FACTORS FREQUENCY                      Additional Factors Info  Allergies, Code Status Code Status Info: DNR Allergies Info: Allergies: Penicillins, Tylenol With Codeine #3           Current Medications (02/07/2015): Current Facility-Administered Medications  Medication Dose Route Frequency Provider Last Rate Last Dose  . albuterol (PROVENTIL) (2.5 MG/3ML) 0.083% nebulizer solution 2.5 mg  2.5 mg  Nebulization Q6H PRN Hosie Poisson, MD   2.5 mg at 02/07/15 1015  . anidulafungin (ERAXIS) 100 mg in sodium chloride 0.9 % 100 mL IVPB  100 mg Intravenous Q24H Haze Justin Shade, RPH   100 mg at 02/06/15 1801  . antiseptic oral rinse (CPC / CETYLPYRIDINIUM CHLORIDE 0.05%) solution 7 mL  7 mL Mouth Rinse BID Kara Mead V, MD   7 mL at 02/07/15 0934  . arformoterol (BROVANA) nebulizer solution 15 mcg  15 mcg Nebulization BID Rahul P Desai, PA-C   15 mcg at 02/07/15 [id number]  . budesonide (PULMICORT) nebulizer solution 0.5 mg  0.5 mg Nebulization BID Rahul P Desai, PA-C   0.5 mg at 02/07/15 0640  . fentaNYL (SUBLIMAZE) injection 12.5-50 mcg  12.5-50 mcg Intravenous Q2H PRN Corey Harold, NP   50 mcg at 02/05/15 0316  . heparin injection 5,000 Units  5,000 Units Subcutaneous 3 times per day Rahul P Desai, PA-C   5,000 Units at 02/07/15 0700  . levothyroxine (SYNTHROID, LEVOTHROID) tablet 25 mcg  25 mcg Oral QAC breakfast Hosie Poisson, MD   25 mcg at 02/07/15 0933  . meropenem (MERREM) 1 g in sodium chloride 0.9 % 100 mL IVPB  1 g Intravenous Q12H Christine E Shade, RPH   1 g at 02/07/15 0933  . naphazoline-pheniramine (NAPHCON-A) 0.025-0.3 % ophthalmic solution 1 drop  1 drop Both Eyes QID PRN Davonna Belling  Hoyle Barr, MD      . ondansetron [facility]) tablet 4 mg  4 mg Oral Q6H PRN Edwin Dada, MD       Or  . ondansetron (ZOFRAN) injection 4 mg  4 mg Intravenous Q6H PRN Edwin Dada, MD      . sodium chloride 0.9 % bolus 500 mL  500 mL Intravenous Once Corey Harold, NP      . sodium chloride 0.9 % injection 3 mL  3 mL Intravenous Q12H Edwin Dada, MD   3 mL at 02/07/15 0935   Do not use this list as official medication orders. Please verify with discharge summary.  Discharge Medications:   Medication List    ASK your doctor about these medications        amLODipine 5 MG tablet  Commonly known as:  NORVASC  Take 5 mg by mouth daily.     FISH OIL PO  Take 1 capsule by mouth  daily.     Fluticasone-Salmeterol 100-50 MCG/DOSE Aepb  Commonly known as:  ADVAIR  Inhale 1 puff into the lungs 2 (two) times daily.     hydrochlorothiazide 12.5 MG capsule  Commonly known as:  MICROZIDE  Take 12.5 mg by mouth daily.     Levothyroxine Sodium 25 MCG Caps  Take by mouth daily before breakfast.     losartan 100 MG tablet  Commonly known as:  COZAAR  Take 100 mg by mouth daily.     naproxen sodium 220 MG tablet  Commonly known as:  ANAPROX  Take 220 mg by mouth 2 (two) times daily as needed (pain).     predniSONE 10 MG tablet  Commonly known as:  DELTASONE  Take 10 mg by mouth 2 (two) times daily with a meal. For 15 days        Relevant Imaging Results:  Relevant Lab Results:  Recent Labs    Additional Information    Carlean Jews, LCSW

## 2015-02-07 NOTE — Progress Notes (Signed)
Notifying physician that patient has labored breathing and breathing treatment being administered.  Surgeon reports to me that this is different than yesterday, when she was sitting in chair and talking.  Physician called back.  Ordered to discontinue fluids.

## 2015-02-07 NOTE — Progress Notes (Signed)
Patient gave verbal permission to speak with son regarding care. Patient states " I feel better and I am able to stay on this unit. "

## 2015-02-07 NOTE — Progress Notes (Signed)
Reported to physician that patient family wants patient transferred to ICU.  Patient diuresing and complains/voices concerns to her family about the urinary frequency and wants a foley catheter.  In addition, pt reports SHOB.  Pt saturation on 3L [place] 95%.  Physician ordering transfer, foley catheter, and other orders.

## 2015-02-07 NOTE — Progress Notes (Signed)
Patient A&O x4.  Patient reports, "I feel 99% better than this morning."

## 2015-02-08 ENCOUNTER — Inpatient Hospital Stay (HOSPITAL_COMMUNITY): Payer: Medicare Other

## 2015-02-08 LAB — CULTURE, BLOOD (SINGLE): Culture: NO GROWTH

## 2015-02-08 LAB — BODY FLUID CULTURE

## 2015-02-08 LAB — CULTURE, BLOOD (ROUTINE X 2)
CULTURE: NO GROWTH
CULTURE: NO GROWTH

## 2015-02-08 LAB — ANAEROBIC CULTURE

## 2015-02-08 LAB — BASIC METABOLIC PANEL
ANION GAP: 6 (ref 5–15)
BUN: 19 mg/dL (ref 6–20)
CO2: 24 mmol/L (ref 22–32)
Calcium: 8.5 mg/dL — ABNORMAL LOW (ref 8.9–10.3)
Chloride: 111 mmol/L (ref 101–111)
Creatinine, Ser: 0.72 mg/dL (ref 0.44–1.00)
GFR calc Af Amer: >60 mL/min (ref >60)
GFR calc non Af Amer: >60 mL/min (ref >60)
GLUCOSE: 81 mg/dL (ref 65–99)
POTASSIUM: 4.3 mmol/L (ref 3.5–5.1)
Sodium: 141 mmol/L (ref 135–145)

## 2015-02-08 LAB — MAGNESIUM: Magnesium: 1.2 mg/dL — ABNORMAL LOW (ref 1.7–2.4)

## 2015-02-08 MED ORDER — SACCHAROMYCES BOULARDII 250 MG PO CAPS
250.0000 mg | ORAL_CAPSULE | Freq: Two times a day (BID) | ORAL | Status: DC
  Administered 2015-02-08 – 2015-02-10 (×4): 250 mg via ORAL
  Filled 2015-02-08 (×5): qty 1

## 2015-02-08 MED ORDER — MAGNESIUM OXIDE 400 (241.3 MG) MG PO TABS
400.0000 mg | ORAL_TABLET | Freq: Two times a day (BID) | ORAL | Status: DC
  Administered 2015-02-08 – 2015-02-10 (×4): 400 mg via ORAL
  Filled 2015-02-08 (×5): qty 1

## 2015-02-08 MED ORDER — FUROSEMIDE 20 MG PO TABS
20.0000 mg | ORAL_TABLET | Freq: Once | ORAL | Status: AC
  Administered 2015-02-08: 20 mg via ORAL
  Filled 2015-02-08: qty 1

## 2015-02-08 MED ORDER — SODIUM CHLORIDE 0.9 % IV SOLN
1.0000 g | Freq: Two times a day (BID) | INTRAVENOUS | Status: DC
  Filled 2015-02-08 (×2): qty 1

## 2015-02-08 NOTE — Progress Notes (Signed)
5 Days Post-Op  Subjective: Pt apparently got some diuresis yesterday, did not get OOb except to BR, had a foley placed for diuresis.  She has not been up walking at least since Saturday, Family wanted her transferred back to SDU because they thought she was getting better care.  No beds available.  This Am she does feel better.  Sats good on nasal cannula.   IV is saline locked, she has not been walking over the weekend but has been seen by PT. She is having no issues with full liquid diet.   Objective: Vital signs in last 24 hours: Temp:  [97.3 F (36.3 C)-98.3 F (36.8 C)] 98.3 F (36.8 C) (11/14 0629) Pulse Rate:  [55-68] 55 (11/14 0629) Resp:  [17-22] 18 (11/14 0629) BP: (134-173)/(64-83) 173/83 mmHg (11/14 0629) SpO2:  [95 %-100 %] 98 % (11/14 0750) Last BM Date: 02/08/15 760 PO 150 from colostomy recorded 2100 urine 11000 ml Positive if I/O is correct Afebrile, VSS No labs today, OK yesterday Diet: full liquids Intake/Output from previous day: 11/13 0701 - 11/14 0700 In: 860 [P.O.:760; IV Piggyback:100] Out: 2250 [Urine:2100; Stool:150] Intake/Output this shift:    General appearance: alert, cooperative and no distress Resp: clear to auscultation bilaterally and down some at bases GI: soft, sore, sites look fine, + BS, stool in the ostomy.  Open wound looks fine.  Lab Results:   Recent Labs  02/06/15 0525 02/07/15 0524  WBC 10.9* 7.5  HGB 9.0* 9.9*  HCT 28.0* 30.8*  PLT 166 174    BMET  Recent Labs  02/06/15 0525 02/07/15 0524  NA 141 144  K 4.1 4.8  CL 117* 119*  CO2 20* 21*  GLUCOSE 105* 87  BUN 28* 23*  CREATININE 0.98 0.82  CALCIUM 8.8* 8.9   PT/INR No results for input(s): LABPROT, INR in the last 72 hours.   Recent Labs Lab 02/02/15 2249 02/03/15 0500 02/04/15 0641  AST 18 90* 53*  ALT 24 65* 93*  ALKPHOS 72 71 66  BILITOT 1.3* 2.0* 1.2  PROT 5.5* 4.3* 4.0*  ALBUMIN 3.0* 2.2* 1.8*     Lipase  No results found for: LIPASE    Studies/Results: Dg Chest Port 1 View  02/07/2015  CLINICAL DATA:  Decreased O2 sats, change in mental status, labored breathing and weakness peer EXAM: PORTABLE CHEST 1 VIEW COMPARISON:  Chest x-ray dated 02/02/2015. FINDINGS: Cardiomegaly is grossly unchanged. Again noted is the large hiatal hernia within the lower mediastinum. There is new central pulmonary vascular congestion and bilateral interstitial edema consistent with congestive heart failure/ volume overload. There are new bilateral pleural effusions, small to moderate in size. IMPRESSION: 1. Cardiomegaly with new central pulmonary vascular congestion and new bilateral interstitial edema consistent with CHF/volume overload. This is indicative of worsened fluid status. 2. New bilateral pleural effusions, small to moderate in size. 3. Large hiatal hernia. Electronically Signed   By: Franki Cabot M.D.   On: 02/07/2015 10:50    Medications: . anidulafungin  100 mg Intravenous Q24H  . antiseptic oral rinse  7 mL Mouth Rinse BID  . arformoterol  15 mcg Nebulization BID  . budesonide (PULMICORT) nebulizer solution  0.5 mg Nebulization BID  . heparin subcutaneous  5,000 Units Subcutaneous 3 times per day  . levothyroxine  25 mcg Oral QAC breakfast  . meropenem (MERREM) IV  1 g Intravenous Q12H  . sodium chloride  500 mL Intravenous Once  . sodium chloride  3 mL Intravenous Q12H  Assessment/Plan Perforated Diverticulitis Sepsis with hypotension Diagnostic laparoscopy, sigmoid colectomy with takedown of splenic flexure and end colostomy, 02/03/15, Dr. Stark Klein Volume overload with diuresis 02/07/15 Hx of Hypertension Hx of Hypothyroid  Hx of Blind with occular melanoma Hx of Bladder prolapse Antibiotics: Meropenem day 6/vancomycin day 3 completed on 02/05/15/ anidulafungin day 5 DVT: Heparin/SCD  Plan:   From our standpoint she can go to a soft diet, she needs to be ambulating and mobilized as much as possible.  I will  defer diuresis to Dr. Karleen Hampshire, I will check her labs this AM.  She has BID dressing changes and wound looks OK. Magnesium is 1.2, will replace, K+ 4.3 this AM0  LOS: 5 days    Pamela Mejia 02/08/2015

## 2015-02-08 NOTE — Progress Notes (Signed)
[facility] PROGRESS NOTE  Pamela Mejia [id number] DOB: [birth year] DOA: 02/02/2015 PCP: Myriam Jacobson, MD Brief history: 81 y.o. F from independent living, brought to [facility] ED 11/8 for abd pain. Found to have bowel perf (perf diverticulitis) with pneumoperitoneum. Admitted by [facility] but developed hypotension; therefore, PCCM called in consultation. Taken to OR 11/9. She underwent sigmoid colectomy with colostomy. She was started on pressors by PCCM and discontinued on 11/10. She was transferred to [facility] service on 11/12.  Assessment/Plan: 1. Diverticulitis , with bowel perforation: s/p surgical repair on 11/9, with colectomy and colostomy.  - she was initially started on levaquin and flagyl which were discontinued, later on started on vancomycin and meropenam. She was started on clear liquids and advanced to full liquid today. She tolerated the diet without any issues. No nausea or vomited. Advanced diet as per surgery.  2. Septic shock: resolved with fluid resuscitation and pressors.  On gentle hydration and off pressors.  procalcitonin improving.  Leukocytosis is improving.  Cultures have been negative so far.  3. Hypothyroidism: Resume synthroid.   4. COPD: worsening wheezing and increased work of breathing on 11/13, she appears fluid overloaded too, with pedal edema and edema of the upper extremities, fluids were discontinued and she was given neb treatment. Stat portable CXR ordered. Her oxygen sats remain stable around 95% on 3lit of  oxygen. CXR shows CHF, gave her lasix 20 mg IV twice during the day , followed by po from 11/14, repeat CXR showed improvement. She is currently off oxygen and is much better.   5. Anemia:  Anemia of blood loss from the surgery. Stable. Monitor.   6. Hypokalemia replete as needed.   Code Status: DNR Family Communication: none at bedside.  Disposition Plan: possibly to rehab when able to tolerate a regular diet.     Consultants:  Surgery  PCCM. AS PRIMARY till 11/11  Procedures:  Sigmoid colectomy on 11/9  Antibiotics:  meropenam 11/9  HPI/Subjective: No nausea or vomiting, she does  Not want the foley catheter removed.   Objective: Filed Vitals:   02/08/15 1500  BP: 102/71  Pulse: 56  Temp: 97.9 F (36.6 C)  Resp: 16    Intake/Output Summary (Last 24 hours) at 02/08/15 1813 Last data filed at 02/08/15 1643  Gross per 24 hour  Intake    503 ml  Output   2950 ml  Net  -2447 ml   Filed Weights   02/03/15 1948 02/05/15 0500 02/08/15 1000  Weight: 67.9 kg (149 lb 11.1 oz) 73.1 kg (161 lb 2.5 oz) 75.2 kg (165 lb 12.6 oz)    Exam:   General:  Tachypnic,   Cardiovascular: s1s2  Respiratory: diminished at bases, scattered wheezing and increased work of breathing.   Abdomen: soft inicision with clean dressing, no bleeding seen.colostomy   Musculoskeletal: pedal edema, bilateral upper extremity edema.   Data Reviewed: Basic Metabolic Panel:  Recent Labs Lab 02/04/15 0641 02/05/15 0340 02/05/15 0710 02/05/15 1350 02/06/15 0525 02/07/15 0524 02/08/15 1139  NA 139  --  142 144 141 144 141  K 3.7  --  3.3* 3.8 4.1 4.8 4.3  CL 118*  --  117* 118* 117* 119* 111  CO2 16*  --  18* 19* 20* 21* 24  GLUCOSE 84  --  104* 106* 105* 87 81  BUN 19  --  27* 28* 28* 23* 19  CREATININE 0.96  --  1.01* 1.08* 0.98 0.82 0.72  CALCIUM 8.0*  --  8.8* 9.3 8.8* 8.9 8.5*  MG 1.1* 2.2  --   --   --   --  1.2*  PHOS 3.2 3.1  --   --   --   --   --    Liver Function Tests:  Recent Labs Lab 02/02/15 2249 02/03/15 0500 02/04/15 0641  AST 18 90* 53*  ALT 24 65* 93*  ALKPHOS 72 71 66  BILITOT 1.3* 2.0* 1.2  PROT 5.5* 4.3* 4.0*  ALBUMIN 3.0* 2.2* 1.8*   No results for input(s): LIPASE, AMYLASE in the last 168 hours. No results for input(s): AMMONIA in the last 168 hours. CBC:  Recent Labs Lab 02/02/15 2249  02/03/15 2125 02/04/15 0345 02/05/15 0340 02/06/15 0525  02/07/15 0524  WBC 24.5*  < > 12.2* 11.2* 11.4* 10.9* 7.5  NEUTROABS 23.0*  --  11.7*  --   --   --   --   HGB 12.8  < > 11.9* 10.3* 9.5* 9.0* 9.9*  HCT 39.4  < > 37.3 32.1* 28.3* 28.0* 30.8*  MCV 94.0  < > 95.4 95.0 93.7 93.0 94.8  PLT 219  < > 185 147* 154 166 174  < > = values in this interval not displayed. Cardiac Enzymes:  Recent Labs Lab 02/02/15 2248  TROPONINI <0.03   BNP (last 3 results) No results for input(s): BNP in the last 8760 hours.  ProBNP (last 3 results) No results for input(s): PROBNP in the last 8760 hours.  CBG: No results for input(s): GLUCAP in the last 168 hours.  Recent Results (from the past 240 hour(s))  Blood Culture (routine x 2)     Status: None   Collection Time: 02/03/15 12:47 AM  Result Value Ref Range Status   Specimen Description BLOOD RIGHT ANTECUBITAL  Final   Special Requests IN PEDIATRIC BOTTLE 2ML  Final   Culture   Final    NO GROWTH 5 DAYS Performed at [facility]    Report Status 02/08/2015 FINAL  Final  Blood Culture (routine x 2)     Status: None   Collection Time: 02/03/15 12:50 AM  Result Value Ref Range Status   Specimen Description BLOOD LEFT ANTECUBITAL  Final   Special Requests BOTTLES DRAWN AEROBIC AND ANAEROBIC 5ML  Final   Culture   Final    NO GROWTH 5 DAYS Performed at [facility]    Report Status 02/08/2015 FINAL  Final  MRSA PCR Screening     Status: None   Collection Time: 02/03/15  4:17 AM  Result Value Ref Range Status   MRSA by PCR NEGATIVE NEGATIVE Final    Comment:        The GeneXpert MRSA Assay (FDA approved for NASAL specimens only), is one component of a comprehensive MRSA colonization surveillance program. It is not intended to diagnose MRSA infection nor to guide or monitor treatment for MRSA infections.   Urine culture     Status: None   Collection Time: 02/03/15 10:20 AM  Result Value Ref Range Status   Specimen Description URINE, CATHETERIZED  Final   Special  Requests NONE  Final   Culture   Final    NO GROWTH 1 DAY Performed at [facility]    Report Status 02/04/2015 FINAL  Final  Anaerobic culture     Status: None   Collection Time: 02/03/15  5:06 PM  Result Value Ref Range Status   Specimen Description PERITONEAL  Final   Special Requests NONE  Final   Gram Stain   Final    MODERATE WBC PRESENT,BOTH PMN AND MONONUCLEAR NO SQUAMOUS EPITHELIAL CELLS SEEN RARE GRAM POSITIVE COCCI IN PAIRS Performed at [facility]    Culture   Final    NO ANAEROBES ISOLATED Performed at [facility]    Report Status 02/08/2015 FINAL  Final  Body fluid culture     Status: None   Collection Time: 02/03/15  5:06 PM  Result Value Ref Range Status   Specimen Description FLUID PERITONEAL  Final   Special Requests SWAB  Final   Gram Stain   Final    MODERATE WBC PRESENT,BOTH PMN AND MONONUCLEAR GRAM NEGATIVE RODS GRAM POSITIVE RODS GRAM POSITIVE COCCI IN CHAINS IN PAIRS YEAST    Culture   Final    RARE STREPTOCOCCUS GROUP D;high probability for S.bovis RARE CANDIDA ALBICANS Performed at [facility]    Report Status 02/08/2015 FINAL  Final   Organism ID, Bacteria STREPTOCOCCUS GROUP D;high probability for S.bovis  Final      Susceptibility   Streptococcus group d;high probability for s.bovis - MIC*    PENICILLIN Value in next row       0.125SENSITIVE    * RARE STREPTOCOCCUS GROUP D;high probability for S.bovis  Culture, blood (single)     Status: None   Collection Time: 02/03/15  7:35 PM  Result Value Ref Range Status   Specimen Description BLOOD LEFT HAND  Final   Special Requests IN PEDIATRIC BOTTLE  .[facility]  Final   Culture   Final    NO GROWTH 5 DAYS Performed at [facility]    Report Status 02/08/2015 FINAL  Final     Studies: Dg Chest Port 1 View  02/08/2015  CLINICAL DATA:  History of asthma, hypertension. Followup exam for CHF EXAM: PORTABLE CHEST 1 VIEW COMPARISON:  02/07/2015 FINDINGS:  Opacity in the lung bases has significantly improved. There is less interstitial thickening. There are no new areas of lung opacity. Cardiac silhouette is mildly enlarged. Moderate hiatal hernia is stable. No pneumothorax. IMPRESSION: 1. Improved lung aeration when compared to the prior exam. Decreased lung base opacity is consistent with decrease sizes of pleural effusions. There is less interstitial thickening consistent with improved interstitial pulmonary edema. Overall the findings are most consistent with improved congestive heart failure. Electronically Signed   By: Lajean Manes M.D.   On: 02/08/2015 12:29   Dg Chest Port 1 View  02/07/2015  CLINICAL DATA:  Decreased O2 sats, change in mental status, labored breathing and weakness peer EXAM: PORTABLE CHEST 1 VIEW COMPARISON:  Chest x-ray dated 02/02/2015. FINDINGS: Cardiomegaly is grossly unchanged. Again noted is the large hiatal hernia within the lower mediastinum. There is new central pulmonary vascular congestion and bilateral interstitial edema consistent with congestive heart failure/ volume overload. There are new bilateral pleural effusions, small to moderate in size. IMPRESSION: 1. Cardiomegaly with new central pulmonary vascular congestion and new bilateral interstitial edema consistent with CHF/volume overload. This is indicative of worsened fluid status. 2. New bilateral pleural effusions, small to moderate in size. 3. Large hiatal hernia. Electronically Signed   By: Franki Cabot M.D.   On: 02/07/2015 10:50    Scheduled Meds: . anidulafungin  100 mg Intravenous Q24H  . antiseptic oral rinse  7 mL Mouth Rinse BID  . arformoterol  15 mcg Nebulization BID  . budesonide (PULMICORT) nebulizer solution  0.5 mg Nebulization BID  . heparin subcutaneous  5,000 Units Subcutaneous 3  times per day  . levothyroxine  25 mcg Oral QAC breakfast  . magnesium oxide  400 mg Oral BID  . meropenem (MERREM) IV  1 g Intravenous Q12H  . saccharomyces  boulardii  250 mg Oral BID  . sodium chloride  500 mL Intravenous Once  . sodium chloride  3 mL Intravenous Q12H   Continuous Infusions:    Principal Problem:   Peritonitis (HCC) Active Problems:   Asthma, moderate persistent   Hypothyroidism   Essential hypertension    Time spent: 25 min    Tangee Marszalek  [facility] Pager [phone] If 7PM-7AM, please contact night-coverage at [email or web address], password [facility] 02/08/2015, 6:13 PM  LOS: 5 days

## 2015-02-08 NOTE — Consult Note (Signed)
WOC ostomy consult note Stoma type/location: LLQ Colostomy HAs midline abdominal surgical wound, healing by secondary intention with NS moist gauze dressing in place.  Stomal assessment/size: 1 3/4" pink patent and producing soft brown stool Peristomal assessment: intact.  Midline wound is 3 cm from stoma, trimmed left side of barrier to accommodate.  Treatment options for stomal/peristomal skin: Barrier ring and 1 piece flat pouch.  I do not feel like Karaya is indicated at this time.  The 1 piece flat seems to be holding fine.  Last pouch change was 02/04/15. Output soft brown stool Ostomy pouching: 1pc.flat with barrier ring Education provided: Patient observes pouch change today but minimally participative.  States she is going to rehab after discharge and son is helping her with ostomy care.  She states he is a caregiver. He is not present today.  Patient able to roll pouch closed and understands to empty when 1/3 full.  Enrolled patient in [facility] program: Yes Will not follow at this time.  Please re-consult if needed.  Domenic Moras RN BSN [place] Pager [phone]

## 2015-02-08 NOTE — Progress Notes (Signed)
Occupational Therapy Treatment Patient Details Name: Pamela Mejia MRN: [id number] DOB: May 14, 1926 Today's Date: 02/08/2015    History of present illness Pamela Mejia is a 81 y.o. female with a past medical history significant for HTN, asthma persistent, and hypothyroidism who presents with acute abdominal pain; CT of the abdomen and pelvis showed pneumoperitoneum with presumed perforated diverticulitis. 11/9 Diagnostic laparoscopy, sigmoid colectomy with takedown of splenic flexure and end colostomy    OT comments    Follow Up Recommendations  SNF    Equipment Recommendations  None recommended by OT    Recommendations for Other Services      Precautions / Restrictions Precautions Precautions: Fall       Mobility Bed Mobility Overal bed mobility: Needs Assistance Bed Mobility: Supine to Sit     Supine to sit: Min assist;HOB elevated        Transfers Overall transfer level: Needs assistance Equipment used: Rolling walker (2 wheeled)   Sit to Stand: Min assist Stand pivot transfers: Min assist       General transfer comment: verbal cues for hand placement        ADL   Eating/Feeding: Independent;Sitting   Grooming: Set up;Sitting                   Toilet Transfer: Moderate assistance;BSC;Squat-pivot   Toileting- Clothing Manipulation and Hygiene: Moderate assistance       Functional mobility during ADLs: Moderate assistance        Vision                            Cognition   Behavior During Therapy: WFL for tasks assessed/performed Overall Cognitive Status: Within Functional Limits for tasks assessed                               General Comments      Pertinent Vitals/ Pain       Pain Assessment: No/denies pain         Frequency       Progress Toward Goals  OT Goals(current goals can now be found in the care plan section)  Progress towards OT goals: Progressing toward goals     Plan Discharge plan  remains appropriate          Activity Tolerance Patient tolerated treatment well   Patient Left in chair;with call bell/phone within reach   Nurse Communication Mobility status        Time: [id number] OT Time Calculation (min): 16 min  Charges: OT General Charges $OT Visit: 1 Procedure OT Treatments $Self Care/Home Management : 8-22 mins  Markeita Alicia D 02/08/2015, 1:17 PM

## 2015-02-08 NOTE — Care Management Important Message (Signed)
Important Message  Patient Details  Name: Chisato Hatzis MRN: [id number] Date of Birth: 1926/04/10   Medicare Important Message Given:  Yes    Camillo Flaming 02/08/2015, 12:07 [place] Message  Patient Details  Name: Kortnie Nordmark MRN: [id number] Date of Birth: [birth year]   Medicare Important Message Given:  Yes    Camillo Flaming 02/08/2015, 12:07 PM

## 2015-02-09 LAB — BASIC METABOLIC PANEL
Anion gap: 6 (ref 5–15)
BUN: 20 mg/dL (ref 6–20)
CALCIUM: 8.7 mg/dL — AB (ref 8.9–10.3)
CO2: 26 mmol/L (ref 22–32)
CREATININE: 0.88 mg/dL (ref 0.44–1.00)
Chloride: 107 mmol/L (ref 101–111)
GFR, EST NON AFRICAN AMERICAN: 58 mL/min — AB (ref >60)
Glucose, Bld: 87 mg/dL (ref 65–99)
Potassium: 3.8 mmol/L (ref 3.5–5.1)
SODIUM: 139 mmol/L (ref 135–145)

## 2015-02-09 LAB — CBC
HCT: 30.8 % — ABNORMAL LOW (ref 36.0–46.0)
Hemoglobin: 10 g/dL — ABNORMAL LOW (ref 12.0–15.0)
MCH: 30.3 pg (ref 26.0–34.0)
MCHC: 32.5 g/dL (ref 30.0–36.0)
MCV: 93.3 fL (ref 78.0–100.0)
PLATELETS: 183 10*3/uL (ref 150–400)
RBC: 3.3 MIL/uL — ABNORMAL LOW (ref 3.87–5.11)
RDW: 14.9 % (ref 11.5–15.5)
WBC: 7.7 10*3/uL (ref 4.0–10.5)

## 2015-02-09 MED ORDER — TRAMADOL HCL 50 MG PO TABS
100.0000 mg | ORAL_TABLET | Freq: Four times a day (QID) | ORAL | Status: DC | PRN
  Administered 2015-02-09: 100 mg via ORAL
  Filled 2015-02-09: qty 2

## 2015-02-09 MED ORDER — CEPHALEXIN 500 MG PO CAPS
500.0000 mg | ORAL_CAPSULE | Freq: Two times a day (BID) | ORAL | Status: DC
  Administered 2015-02-09 – 2015-02-10 (×3): 500 mg via ORAL
  Filled 2015-02-09 (×4): qty 1

## 2015-02-09 NOTE — Progress Notes (Signed)
Physical Therapy Treatment Patient Details Name: Pamela Mejia MRN: [id number] DOB: [birth year] Today's Date: 02/09/2015    History of Present Illness Pamela Mejia is a 81 y.o. female with a past medical history significant for HTN, asthma persistent, and hypothyroidism who presents with acute abdominal pain; CT of the abdomen and pelvis showed pneumoperitoneum with presumed perforated diverticulitis. 11/9 Diagnostic laparoscopy, sigmoid colectomy with takedown of splenic flexure and end colostomy     PT Comments    Pt. Presented in recliner with 15/10 pain in RLQ of abdomen; required min assist to bring trunk upright for STS and min assist for SPT to [facility]; ambulated from recliner to doorway and back to EOB ~12' with slight trunk flexion/guarding, step through pattern and no VC required for positioning in walker; required min-mod assist for sit > supine on bed for BLE lift and bed pad to decrease amount of ab flexion/pain. Notified RN pt. Requesting pain meds.   Follow Up Recommendations  SNF     Equipment Recommendations  Rolling walker with 5" wheels    Recommendations for Other Services       Precautions / Restrictions Precautions Precautions: Fall Precaution Comments: monitor vitals Restrictions Weight Bearing Restrictions: No    Mobility  Bed Mobility Overal bed mobility: Needs Assistance Bed Mobility: Sit to Supine     Supine to sit: Min assist     General bed mobility comments: assist sit > supine of BLE and use of bed pad to decrease abdominal contraction/pt. pain with movement  Transfers Overall transfer level: Needs assistance Equipment used: Rolling walker (2 wheeled) Transfers: Sit to/from Stand Sit to Stand: Min assist Stand pivot transfers: Min assist       General transfer comment: requiring min assist for STS on L side to bring trunk upright d/t pain   Ambulation/Gait Ambulation/Gait assistance: Min guard;Min assist Ambulation Distance (Feet): 12  Feet Assistive device: Rolling walker (2 wheeled) Gait Pattern/deviations: Step-through pattern;Decreased stride length;Trunk flexed Gait velocity: decreased   General Gait Details: pt. ambulated from recliner to doorway and back; slight trunk flexion; step through pattern; no VC to stay inside walker; requested to stop d/t pain of abdomen; 02 97% RA   Stairs            Wheelchair Mobility    Modified Rankin (Stroke Patients Only)       Balance                                    Cognition Arousal/Alertness: Awake/alert Behavior During Therapy: WFL for tasks assessed/performed Overall Cognitive Status: Within Functional Limits for tasks assessed                      Exercises      General Comments        Pertinent Vitals/Pain Pain Assessment: 0-10 Pain Score: 10-Worst pain ever (15/10) Pain Location: RLQ of abdomen with mobility  Pain Descriptors / Indicators: Sore;Other (Comment) (comes in waves) Pain Intervention(s): Limited activity within patient's tolerance;Monitored during session;Patient requesting pain meds-RN notified;Repositioned    Home Living                      Prior Function            PT Goals (current goals can now be found in the care plan section) Acute Rehab PT Goals Time For Goal Achievement: 02/19/15 Potential to Achieve  Goals: Good Progress towards PT goals: Progressing toward goals    Frequency  Min 3X/week    PT Plan      Co-evaluation             End of Session Equipment Utilized During Treatment: Gait belt Activity Tolerance: Patient tolerated treatment well;Patient limited by pain Patient left: with call bell/phone within reach;in bed;with bed alarm set     Time: [id number] PT Time Calculation (min) (ACUTE ONLY): 28 min  Charges:  $Gait Training: 8-22 mins $Therapeutic Activity: 8-22 mins                    G CodesDenna Haggard, SPTA   02/09/2015 12:59 PM    Pager: [phone]   Reviewed above and agree Rica Koyanagi  PTA [facility]  Acute  Rehab Pager      [phone]

## 2015-02-09 NOTE — Progress Notes (Signed)
[facility] PROGRESS NOTE  Pamela Mejia [id number] DOB: August 26, 1926 DOA: 02/02/2015 PCP: Myriam Jacobson, MD Brief history: 81 y.o. F from independent living, brought to [facility] ED 11/8 for abd pain. Found to have bowel perf (perf diverticulitis) with pneumoperitoneum. Admitted by [facility] but developed hypotension; therefore, PCCM called in consultation. Taken to OR 11/9. She underwent sigmoid colectomy with colostomy. She was started on pressors by PCCM and discontinued on 11/10. She was transferred to [facility] service on 11/12. Peritoneal fluid was sent for culture and growing streptococcus D,. Discussed with ID Dr Tommy Medal over the phone and recommended keflex for anther 5 days to complete a 10 day course.   Assessment/Plan: 1. Diverticulitis , with bowel perforation: s/p surgical repair on 11/9, with colectomy and colostomy.  - she was initially started on levaquin and flagyl which were discontinued, later on started on vancomycin and meropenam. She was started on clear liquids and advanced to full liquid today. She tolerated the diet without any issues. No nausea or vomited. Advanced diet as per surgery.  2. Septic shock: resolved with fluid resuscitation and pressors.  On gentle hydration and off pressors.  procalcitonin improving.  Leukocytosis has resolved.  Peritoneal cultures grew strep D , possibly strep Bovis,  discussed with Dr Tommy Medal and plan to give her keflex to complete the course for 10 days.  3. Hypothyroidism: Resume synthroid.   4. COPD: worsening wheezing and increased work of breathing on 11/13, she appears fluid overloaded too, with pedal edema and edema of the upper extremities, fluids were discontinued and she was given neb treatment. Stat portable CXR ordered. Her oxygen sats remain stable around 95% on 3lit of [place] oxygen. CXR shows CHF, gave her lasix 20 mg IV twice during the day , followed by po from 11/14, repeat CXR showed improvement. She is currently off oxygen  and is much better. Plan to given her another 20 mg of po lasix on 11/15 and possibly stop.   5. Anemia:  Anemia of blood loss from the surgery. Stable. Monitor periodically.   6. Hypokalemia replete as needed.   7. Hypomagnesemia; replete with po mag oxide.   Code Status: DNR Family Communication: none at bedside.  Disposition Plan: possibly to rehab when able to tolerate a regular diet. Currently on soft, may be a day or two.    Consultants:  Surgery  PCCM. AS PRIMARY till 11/11  ID Dr Tommy Medal over the phone.   Procedures:  Sigmoid colectomy on 11/9  Antibiotics:  meropenam 11/9-11/14  Vancomycin for 3 days.   HPI/Subjective: No nausea or vomiting, reports breathing better.   Objective: Filed Vitals:   02/09/15 0500  BP: 150/61  Pulse: 63  Temp: 98 F (36.7 C)  Resp: 17    Intake/Output Summary (Last 24 hours) at 02/09/15 1058 Last data filed at 02/09/15 [id number]  Gross per 24 hour  Intake      0 ml  Output   2750 ml  Net  -2750 ml   Filed Weights   02/03/15 1948 02/05/15 0500 02/08/15 1000  Weight: 67.9 kg (149 lb 11.1 oz) 73.1 kg (161 lb 2.5 oz) 75.2 kg (165 lb 12.6 oz)    Exam:   General:  Sitting in the chair , comfortable, off oxygen.    Cardiovascular: s1s2  Respiratory: clear , no wheezing or rhonchi, diminished at bases.   Abdomen: soft inicision with clean dressing, no bleeding seen.colostomy bag with some stool.   Musculoskeletal: pedal edema, bilateral  upper extremity edema, improved.   Data Reviewed: Basic Metabolic Panel:  Recent Labs Lab 02/04/15 0641 02/05/15 0340  02/05/15 1350 02/06/15 0525 02/07/15 0524 02/08/15 1139 02/09/15 0525  NA 139  --   < > 144 141 144 141 139  K 3.7  --   < > 3.8 4.1 4.8 4.3 3.8  CL 118*  --   < > 118* 117* 119* 111 107  CO2 16*  --   < > 19* 20* 21* 24 26  GLUCOSE 84  --   < > 106* 105* 87 81 87  BUN 19  --   < > 28* 28* 23* 19 20  CREATININE 0.96  --   < > 1.08* 0.98 0.82 0.72 0.88   CALCIUM 8.0*  --   < > 9.3 8.8* 8.9 8.5* 8.7*  MG 1.1* 2.2  --   --   --   --  1.2*  --   PHOS 3.2 3.1  --   --   --   --   --   --   < > = values in this interval not displayed. Liver Function Tests:  Recent Labs Lab 02/02/15 2249 02/03/15 0500 02/04/15 0641  AST 18 90* 53*  ALT 24 65* 93*  ALKPHOS 72 71 66  BILITOT 1.3* 2.0* 1.2  PROT 5.5* 4.3* 4.0*  ALBUMIN 3.0* 2.2* 1.8*   No results for input(s): LIPASE, AMYLASE in the last 168 hours. No results for input(s): AMMONIA in the last 168 hours. CBC:  Recent Labs Lab 02/02/15 2249  02/03/15 2125 02/04/15 0345 02/05/15 0340 02/06/15 0525 02/07/15 0524 02/09/15 0525  WBC 24.5*  < > 12.2* 11.2* 11.4* 10.9* 7.5 7.7  NEUTROABS 23.0*  --  11.7*  --   --   --   --   --   HGB 12.8  < > 11.9* 10.3* 9.5* 9.0* 9.9* 10.0*  HCT 39.4  < > 37.3 32.1* 28.3* 28.0* 30.8* 30.8*  MCV 94.0  < > 95.4 95.0 93.7 93.0 94.8 93.3  PLT 219  < > 185 147* 154 166 174 183  < > = values in this interval not displayed. Cardiac Enzymes:  Recent Labs Lab 02/02/15 2248  TROPONINI <0.03   BNP (last 3 results) No results for input(s): BNP in the last 8760 hours.  ProBNP (last 3 results) No results for input(s): PROBNP in the last 8760 hours.  CBG: No results for input(s): GLUCAP in the last 168 hours.  Recent Results (from the past 240 hour(s))  Blood Culture (routine x 2)     Status: None   Collection Time: 02/03/15 12:47 AM  Result Value Ref Range Status   Specimen Description BLOOD RIGHT ANTECUBITAL  Final   Special Requests IN PEDIATRIC BOTTLE 2ML  Final   Culture   Final    NO GROWTH 5 DAYS Performed at [facility]    Report Status 02/08/2015 FINAL  Final  Blood Culture (routine x 2)     Status: None   Collection Time: 02/03/15 12:50 AM  Result Value Ref Range Status   Specimen Description BLOOD LEFT ANTECUBITAL  Final   Special Requests BOTTLES DRAWN AEROBIC AND ANAEROBIC 5ML  Final   Culture   Final    NO GROWTH 5  DAYS Performed at [facility]    Report Status 02/08/2015 FINAL  Final  MRSA PCR Screening     Status: None   Collection Time: 02/03/15  4:17 AM  Result  Value Ref Range Status   MRSA by PCR NEGATIVE NEGATIVE Final    Comment:        The GeneXpert MRSA Assay (FDA approved for NASAL specimens only), is one component of a comprehensive MRSA colonization surveillance program. It is not intended to diagnose MRSA infection nor to guide or monitor treatment for MRSA infections.   Urine culture     Status: None   Collection Time: 02/03/15 10:20 AM  Result Value Ref Range Status   Specimen Description URINE, CATHETERIZED  Final   Special Requests NONE  Final   Culture   Final    NO GROWTH 1 DAY Performed at [facility]    Report Status 02/04/2015 FINAL  Final  Anaerobic culture     Status: None   Collection Time: 02/03/15  5:06 PM  Result Value Ref Range Status   Specimen Description PERITONEAL  Final   Special Requests NONE  Final   Gram Stain   Final    MODERATE WBC PRESENT,BOTH PMN AND MONONUCLEAR NO SQUAMOUS EPITHELIAL CELLS SEEN RARE GRAM POSITIVE COCCI IN PAIRS Performed at [facility]    Culture   Final    NO ANAEROBES ISOLATED Performed at [facility]    Report Status 02/08/2015 FINAL  Final  Body fluid culture     Status: None   Collection Time: 02/03/15  5:06 PM  Result Value Ref Range Status   Specimen Description FLUID PERITONEAL  Final   Special Requests SWAB  Final   Gram Stain   Final    MODERATE WBC PRESENT,BOTH PMN AND MONONUCLEAR GRAM NEGATIVE RODS GRAM POSITIVE RODS GRAM POSITIVE COCCI IN CHAINS IN PAIRS YEAST    Culture   Final    RARE STREPTOCOCCUS GROUP D;high probability for S.bovis RARE CANDIDA ALBICANS Performed at [facility]    Report Status 02/08/2015 FINAL  Final   Organism ID, Bacteria STREPTOCOCCUS GROUP D;high probability for S.bovis  Final      Susceptibility   Streptococcus group  d;high probability for s.bovis - MIC*    PENICILLIN Value in next row       0.125SENSITIVE    * RARE STREPTOCOCCUS GROUP D;high probability for S.bovis  Culture, blood (single)     Status: None   Collection Time: 02/03/15  7:35 PM  Result Value Ref Range Status   Specimen Description BLOOD LEFT HAND  Final   Special Requests IN PEDIATRIC BOTTLE  .[facility]  Final   Culture   Final    NO GROWTH 5 DAYS Performed at [facility]    Report Status 02/08/2015 FINAL  Final     Studies: Dg Chest Port 1 View  02/08/2015  CLINICAL DATA:  History of asthma, hypertension. Followup exam for CHF EXAM: PORTABLE CHEST 1 VIEW COMPARISON:  02/07/2015 FINDINGS: Opacity in the lung bases has significantly improved. There is less interstitial thickening. There are no new areas of lung opacity. Cardiac silhouette is mildly enlarged. Moderate hiatal hernia is stable. No pneumothorax. IMPRESSION: 1. Improved lung aeration when compared to the prior exam. Decreased lung base opacity is consistent with decrease sizes of pleural effusions. There is less interstitial thickening consistent with improved interstitial pulmonary edema. Overall the findings are most consistent with improved congestive heart failure. Electronically Signed   By: Lajean Manes M.D.   On: 02/08/2015 12:29    Scheduled Meds: . antiseptic oral rinse  7 mL Mouth Rinse BID  . arformoterol  15 mcg Nebulization BID  .  budesonide (PULMICORT) nebulizer solution  0.5 mg Nebulization BID  . heparin subcutaneous  5,000 Units Subcutaneous 3 times per day  . levothyroxine  25 mcg Oral QAC breakfast  . magnesium oxide  400 mg Oral BID  . meropenem (MERREM) IV  1 g Intravenous Q12H  . saccharomyces boulardii  250 mg Oral BID  . sodium chloride  500 mL Intravenous Once  . sodium chloride  3 mL Intravenous Q12H   Continuous Infusions:    Principal Problem:   Peritonitis (HCC) Active Problems:   Asthma, moderate persistent   Hypothyroidism    Essential hypertension    Time spent: 25 min    Pamela Mejia  [facility] Pager [phone] If 7PM-7AM, please contact night-coverage at [email or web address], password [facility] 02/09/2015, 10:58 AM  LOS: 6 days

## 2015-02-09 NOTE — Consult Note (Signed)
Asked to evaluate midline surgical wound for VAC placement, however the wafer of the ostomy pouch is right along side the wound edge and I feal that obtaining a seal and maintaining a seal may be a challenge for staff as well as for staff at a rehab facility.  I have asked my partner that changed her pouch today to let me know her thoughts as well since she was able to see the patient's abdomen with out her ostomy pouch in place.   [place], [place]

## 2015-02-09 NOTE — Care Management Note (Signed)
Case Management Note  Patient Details  Name: Pamela Mejia MRN: [id number] Date of Birth: [birth year]  Subjective/Objective:        81 yo admitted with Peritonitis            Action/Plan: From Independent living facility. CSW working on SNF placement.  Expected Discharge Date:                  Expected Discharge Plan:  Skilled Nursing Facility  In-House Referral:  NA, Clinical Social Work  Discharge planning Services  CM Consult  Post Acute Care Choice:  NA Choice offered to:  NA  DME Arranged:    DME Agency:     HH Arranged:    [place] Agency:     Status of Service:  Completed, signed off  Medicare Important Message Given:  Yes Date Medicare IM Given:    Medicare IM give by:    Date Additional Medicare IM Given:    Additional Medicare Important Message give by:     If discussed at [place] of Stay Meetings, dates discussed:    Additional Comments:  Lynnell Catalan, RN 02/09/2015, 3:41 PM

## 2015-02-09 NOTE — Progress Notes (Signed)
6 Days Post-Op  Subjective: I think she is doing well from a surgery standpoint. She says she was up in chair but did not walk again yesterday.  Foley is still in.  Chest sounds clear, but she is getting breathing Rx now.  Objective: Vital signs in last 24 hours: Temp:  [97.9 F (36.6 C)-98.1 F (36.7 C)] 98 F (36.7 C) (11/15 0500) Pulse Rate:  [56-63] 63 (11/15 0500) Resp:  [16-17] 17 (11/15 0500) BP: (102-150)/(61-71) 150/61 mmHg (11/15 0500) SpO2:  [94 %-99 %] 98 % (11/15 0500) Weight:  [75.2 kg (165 lb 12.6 oz)] 75.2 kg (165 lb 12.6 oz) (11/14 1000) Last BM Date: 02/08/15 100 recorded from the ostomy PO not recorded, 2350 urine recorded. Afebrile, VSS Labs OK Intake/Output from previous day: 11/14 0701 - 11/15 0700 In: 103 [I.V.:3; IV Piggyback:100] Out: 2450 [Urine:2350; Stool:100] Intake/Output this shift:    General appearance: alert, cooperative and no distress Resp: clear to auscultation bilaterally GI: soft, sore, open wound looks good.  She has BS, tolerating soft diet.  Ostomy bag is 1/2, or more full this AM.  Lab Results:   Recent Labs  02/07/15 0524 02/09/15 0525  WBC 7.5 7.7  HGB 9.9* 10.0*  HCT 30.8* 30.8*  PLT 174 183    BMET  Recent Labs  02/08/15 1139 02/09/15 0525  NA 141 139  K 4.3 3.8  CL 111 107  CO2 24 26  GLUCOSE 81 87  BUN 19 20  CREATININE 0.72 0.88  CALCIUM 8.5* 8.7*   PT/INR No results for input(s): LABPROT, INR in the last 72 hours.   Recent Labs Lab 02/02/15 2249 02/03/15 0500 02/04/15 0641  AST 18 90* 53*  ALT 24 65* 93*  ALKPHOS 72 71 66  BILITOT 1.3* 2.0* 1.2  PROT 5.5* 4.3* 4.0*  ALBUMIN 3.0* 2.2* 1.8*     Lipase  No results found for: LIPASE   Studies/Results: Dg Chest Port 1 View  02/08/2015  CLINICAL DATA:  History of asthma, hypertension. Followup exam for CHF EXAM: PORTABLE CHEST 1 VIEW COMPARISON:  02/07/2015 FINDINGS: Opacity in the lung bases has significantly improved. There is less  interstitial thickening. There are no new areas of lung opacity. Cardiac silhouette is mildly enlarged. Moderate hiatal hernia is stable. No pneumothorax. IMPRESSION: 1. Improved lung aeration when compared to the prior exam. Decreased lung base opacity is consistent with decrease sizes of pleural effusions. There is less interstitial thickening consistent with improved interstitial pulmonary edema. Overall the findings are most consistent with improved congestive heart failure. Electronically Signed   By: Lajean Manes M.D.   On: 02/08/2015 12:29   Dg Chest Port 1 View  02/07/2015  CLINICAL DATA:  Decreased O2 sats, change in mental status, labored breathing and weakness peer EXAM: PORTABLE CHEST 1 VIEW COMPARISON:  Chest x-ray dated 02/02/2015. FINDINGS: Cardiomegaly is grossly unchanged. Again noted is the large hiatal hernia within the lower mediastinum. There is new central pulmonary vascular congestion and bilateral interstitial edema consistent with congestive heart failure/ volume overload. There are new bilateral pleural effusions, small to moderate in size. IMPRESSION: 1. Cardiomegaly with new central pulmonary vascular congestion and new bilateral interstitial edema consistent with CHF/volume overload. This is indicative of worsened fluid status. 2. New bilateral pleural effusions, small to moderate in size. 3. Large hiatal hernia. Electronically Signed   By: Franki Cabot M.D.   On: 02/07/2015 10:50    Medications: . antiseptic oral rinse  7 mL Mouth Rinse BID  .  arformoterol  15 mcg Nebulization BID  . budesonide (PULMICORT) nebulizer solution  0.5 mg Nebulization BID  . heparin subcutaneous  5,000 Units Subcutaneous 3 times per day  . levothyroxine  25 mcg Oral QAC breakfast  . magnesium oxide  400 mg Oral BID  . meropenem (MERREM) IV  1 g Intravenous Q12H  . saccharomyces boulardii  250 mg Oral BID  . sodium chloride  500 mL Intravenous Once  . sodium chloride  3 mL Intravenous Q12H     Assessment/Plan Perforated Diverticulitis Sepsis with hypotension Diagnostic laparoscopy, sigmoid colectomy with takedown of splenic flexure and end colostomy, 02/03/15, Dr. Stark Klein Volume overload with diuresis 02/07/15 Hx of Hypertension Hx of Hypothyroid  Hx of Blind with occular melanoma Hx of Bladder prolapse Antibiotics: Meropenem day 6/vancomycin day 3 completed on 02/05/15/ anidulafungin day 5 DVT: Heparin/SCD   Plan:  I will ask wound care to looks at her wound and see if she can get a wound vac.  It is rather close to the ostomy.  I am pulling her foley, she really needs to walk and be mobilized.  I think when medically ready she can go to SNF next 24- 48 hours.      LOS: 6 days    Pamela Mejia 02/09/2015

## 2015-02-09 NOTE — Progress Notes (Signed)
CSW spoke with daughter Lenna Sciara to provide SNF bed offers. Daughter stated that she would like for the patient to return back to her independent living facility. CSW informed daughter of PT/OT recommendation for short term rehab before returning home. Daughter stated that she would provide her brother with an update on patient. Daughter wrote down bed offers and was informed by CSW that a list will be left in patient's room.   CSW to follow up with MD regarding possible discharge on today.   Lucius Conn, [place] Social Worker [place] [phone]

## 2015-02-09 NOTE — Discharge Instructions (Signed)
[facility]      [facility] °[phone] ° °OPEN ABDOMINAL SURGERY: POST OP INSTRUCTIONS ° °Always review your discharge instruction sheet given to you by the facility where your surgery was performed. ° °IF YOU HAVE DISABILITY OR FAMILY LEAVE FORMS, YOU MUST BRING THEM TO THE OFFICE FOR PROCESSING.  PLEASE DO NOT GIVE THEM TO YOUR DOCTOR. ° °1. A prescription for pain medication may be given to you upon discharge.  Take your pain medication as prescribed, if needed.  If narcotic pain medicine is not needed, then you may take acetaminophen (Tylenol) or ibuprofen (Advil) as needed. °2. Take your usually prescribed medications unless otherwise directed. °3. If you need a refill on your pain medication, please contact your pharmacy. They will contact our office to request authorization.  Prescriptions will not be filled after 5pm or on week-ends. °4. You should follow a light diet the first few days after arrival home, such as soup and crackers, pudding, etc.unless your doctor has advised otherwise. A high-fiber, low fat diet can be resumed as tolerated.   Be sure to include lots of fluids daily. Most patients will experience some swelling and bruising on the chest and neck area.  Ice packs will help.  Swelling and bruising can take several days to resolve °5. Most patients will experience some swelling and bruising in the area of the incision. Ice pack will help. Swelling and bruising can take several days to resolve..  °6. It is common to experience some constipation if taking pain medication after surgery.  Increasing fluid intake and taking a stool softener will usually help or prevent this problem from occurring.  A mild laxative (Milk of Magnesia or Miralax) should be taken according to package directions if there are no bowel movements after 48 hours. °7.  You may have steri-strips (small skin tapes) in place directly over the incision.  These strips should be left on the skin for 7-10 days.  If your  surgeon used skin glue on the incision, you may shower in 24 hours.  The glue will flake off over the next 2-3 weeks.  Any sutures or staples will be removed at the office during your follow-up visit. You may find that a light gauze bandage over your incision may keep your staples from being rubbed or pulled. You may shower and replace the bandage daily. °8. ACTIVITIES:  You may resume regular (light) daily activities beginning the next day--such as daily self-care, walking, climbing stairs--gradually increasing activities as tolerated.  You may have sexual intercourse when it is comfortable.  Refrain from any heavy lifting or straining until approved by your doctor. °a. You may drive when you no longer are taking prescription pain medication, you can comfortably wear a seatbelt, and you can safely maneuver your car and apply brakes °b. Return to Work: ___________________________________ °9. You should see your doctor in the office for a follow-up appointment approximately two weeks after your surgery.  Make sure that you call for this appointment within a day or two after you arrive home to insure a convenient appointment time. °OTHER INSTRUCTIONS:  °_____________________________________________________________ °_____________________________________________________________ ° °WHEN TO CALL YOUR DOCTOR: °1. Fever over 101.0 °2. Inability to urinate °3. Nausea and/or vomiting °4. Extreme swelling or bruising °5. Continued bleeding from incision. °6. Increased pain, redness, or drainage from the incision. °7. Difficulty swallowing or breathing °8. Muscle cramping or spasms. °9. Numbness or tingling in hands or feet or around lips. ° °The clinic staff is available to   answer your questions during regular business hours.  Please don’t hesitate to call and ask to speak to one of the nurses if you have concerns. ° °For further questions, please visit [email or web address] ° °Colostomy Home Guide °A colostomy is an  opening for stool to leave your body when a medical condition prevents it from leaving through the usual opening (rectum). During a surgery, a piece of large intestine (colon) is brought through a hole in the abdominal wall. The new opening is called a stoma or ostomy. A bag or pouch fits over the stoma to catch stool and gas. Your stool may be liquid, somewhat pasty, or formed. °CARING FOR YOUR STOMA  °Normally, the stoma looks a lot like the inside of your cheek: pink, red, and moist. At first it may be swollen, but this swelling will decrease within 6 weeks. °Keep the skin around your stoma clean and dry. You can gently wash your stoma and the skin around your stoma in the shower with a clean, soft washcloth. If you develop any skin irritation, your caregiver may give you a stoma powder or ointment to help heal the area. Do not use any products other than those specifically given to you by your caregiver.  °Your stoma should not be uncomfortable. If you notice any stinging or burning, your pouch may be leaking, and the skin around your stoma may be coming into contact with stool. This can cause skin irritation. If you notice stinging, replace your pouch with a new one and discard the old one. °OSTOMY POUCHES  °The pouch that fits over the ostomy can be made up of either 1 or 2 pieces. A one-piece pouch has a skin barrier piece and the pouch itself in one unit. A two-piece pouch has a skin barrier with a separate pouch that snaps on and off of the skin barrier. Either way, you should empty the pouch when it is only  to ½ full. Do not let more stool or gas build up. This could cause the pouch to leak. °Some ostomy bags have a built-in gas release valve. Ostomy deodorizer (5 drops) can be put into the pouch to prevent odor. Some people use ostomy lubricant drops inside the pouch to help the stool slide out of the bag more easily and completely.  °EMPTYING YOUR OSTOMY POUCH  °You may get lessons on how to empty your  pouch from a wound-ostomy nurse before you leave the hospital. Here are the basic steps: °· Wash your hands with soap and water. °· Sit far back on the toilet. °· Put several pieces of toilet paper into the toilet water. This will prevent splashing as you empty the stool into the toilet bowl. °· Unclip or unvelcro the tail end of the pouch. °· Unroll the tail and empty stool into the toilet. °· Clean the tail with toilet paper. °· Reroll the tail, and clip or velcro it closed. °· Wash your hands again. °CHANGING YOUR OSTOMY POUCH  °Change your ostomy pouch about every 3 to 4 days for the first 6 weeks, then every 5 to7 days. Always change the bag sooner if there is any leakage or you begin to notice any discomfort or irritation of the skin around the stoma. When possible, plan to change your ostomy pouch before eating or drinking as this will lessen the chance of stool coming out during the pouch change. A wound-ostomy nurse may teach you how to change your pouch before you leave the hospital. Here are   the basic steps: °· Lay out your supplies. °· Wash your hands with soap and water. °· Carefully remove the old pouch. °· Wash the stoma and allow it to dry. Men may be advised to shave any hair around the stoma very carefully. This will make the adhesive stick better. °· Use the stoma measuring guide that comes with your pouch set to decide what size hole you will need to cut in the skin barrier piece. Choose the smallest possible size that will hold the stoma but will not touch it. °· Use the guide to trace the circle on the back of the skin barrier piece. Cut out the hole. °· Hold the skin barrier piece over the stoma to make sure the hole is the correct size. °· Remove the adhesive paper backing from the skin barrier piece. °· Squeeze stoma paste around the opening of the skin barrier piece. °· Clean and dry the skin around the stoma again. °· Carefully fit the skin barrier piece over your stoma. °· If you are  using a two-piece pouch, snap the pouch onto the skin barrier piece. °· Close the tail of the pouch. °· Put your hand over the top of the skin barrier piece to help warm it for about 5 minutes, so that it conforms to your body better. °· Wash your hands again. °DIET TIPS  °· Continue to follow your usual diet. °· Drink about eight 8 oz glasses of water each day. °· You can prevent gas by eating slowly and chewing your food thoroughly. °· If you feel concerned that you have too much gas, you can cut back on gas-producing foods, such as: °¨ Spicy foods. °¨ Onions and garlic. °¨ Cruciferous vegetables (cabbage, broccoli, cauliflower, Brussels sprouts). °¨ Beans and legumes. °¨ Some cheeses. °¨ Eggs. °¨ Fish. °¨ Bubbly (carbonated) drinks. °¨ Chewing gum. °GENERAL TIPS  °· You can shower with or without the bag in place. °· Always keep the bag on if you are bathing or swimming. °· If your bag gets wet, you can dry it with a blow-dryer set to cool. °· Avoid wearing tight clothing directly over your stoma so that it does not become irritated or bleed. Tight clothing can also prevent stool from draining into the pouch. °· It is helpful to always have an extra skin barrier and pouch with you when traveling. Do not leave them anywhere too warm, as parts of them can melt. °· Do not let your seat belt rest on your stoma. Try to keep the seat belt either above or below your stoma, or use a tiny pillow to cushion it. °· You can still participate in sports, but you should avoid activities in which there is a risk of getting hit in the abdomen. °· You can still have sex. It is a good idea to empty your pouch prior to sex. Some people and their partners feel very comfortable seeing the pouch during sex. Others choose to wear lingerie or a T-shirt that covers the device. °SEEK IMMEDIATE MEDICAL CARE IF: °· You notice a change in the size or color of the stoma, especially if it becomes very red, purple, black, or pale white. °· You  have bloody stools or bleeding from the stoma. °· You have abdominal pain, nausea, vomiting, or bloating. °· There is anything unusual protruding from the stoma. °· You have irritation or red skin around the stoma. °· No stool is passing from the stoma. °· You have diarrhea (requiring more frequent   than normal pouch emptying). °  °This information is not intended to replace advice given to you by your health care provider. Make sure you discuss any questions you have with your health care provider. °  °Document Released: 03/16/2003 Document Revised: 06/05/2011 Document Reviewed: 08/10/2010 °Elsevier Interactive Patient Education ©2016 [employer] ° °

## 2015-02-10 ENCOUNTER — Inpatient Hospital Stay (HOSPITAL_COMMUNITY): Payer: Medicare Other

## 2015-02-10 DIAGNOSIS — E038 Other specified hypothyroidism: Secondary | ICD-10-CM

## 2015-02-10 DIAGNOSIS — I10 Essential (primary) hypertension: Secondary | ICD-10-CM

## 2015-02-10 DIAGNOSIS — R609 Edema, unspecified: Secondary | ICD-10-CM

## 2015-02-10 DIAGNOSIS — K5730 Diverticulosis of large intestine without perforation or abscess without bleeding: Secondary | ICD-10-CM

## 2015-02-10 LAB — BASIC METABOLIC PANEL
Anion gap: 6 (ref 5–15)
BUN: 19 mg/dL (ref 6–20)
CALCIUM: 8.3 mg/dL — AB (ref 8.9–10.3)
CO2: 28 mmol/L (ref 22–32)
CREATININE: 0.92 mg/dL (ref 0.44–1.00)
Chloride: 102 mmol/L (ref 101–111)
GFR calc non Af Amer: 55 mL/min — ABNORMAL LOW (ref >60)
Glucose, Bld: 85 mg/dL (ref 65–99)
Potassium: 3.7 mmol/L (ref 3.5–5.1)
SODIUM: 136 mmol/L (ref 135–145)

## 2015-02-10 LAB — MAGNESIUM: Magnesium: 1.1 mg/dL — ABNORMAL LOW (ref 1.7–2.4)

## 2015-02-10 MED ORDER — HYDROCHLOROTHIAZIDE 12.5 MG PO CAPS
12.5000 mg | ORAL_CAPSULE | Freq: Every day | ORAL | Status: DC
  Administered 2015-02-10: 12.5 mg via ORAL
  Filled 2015-02-10: qty 1

## 2015-02-10 MED ORDER — TRAMADOL HCL 50 MG PO TABS: 50.0000 mg | ORAL_TABLET | Freq: Four times a day (QID) | ORAL | Status: DC | PRN

## 2015-02-10 MED ORDER — SACCHAROMYCES BOULARDII 250 MG PO CAPS: 250.0000 mg | ORAL_CAPSULE | Freq: Two times a day (BID) | ORAL | Status: DC

## 2015-02-10 MED ORDER — MAGNESIUM OXIDE 400 (241.3 MG) MG PO TABS: 400.0000 mg | ORAL_TABLET | Freq: Every day | ORAL | Status: DC

## 2015-02-10 MED ORDER — MAGNESIUM SULFATE 2 GM/50ML IV SOLN
2.0000 g | Freq: Once | INTRAVENOUS | Status: AC
  Administered 2015-02-10: 2 g via INTRAVENOUS
  Filled 2015-02-10: qty 50

## 2015-02-10 MED ORDER — CEPHALEXIN 500 MG PO CAPS: 500.0000 mg | ORAL_CAPSULE | Freq: Two times a day (BID) | ORAL | Status: DC

## 2015-02-10 NOTE — Progress Notes (Signed)
Pamela Mejia to be D/C'd SNF per MD order.  Discussed prescriptions and follow up appointments with the patient. Prescriptions given to patient, medication list explained in detail. Pt verbalized understanding.    Medication List    STOP taking these medications        amLODipine 5 MG tablet  Commonly known as:  NORVASC     losartan 100 MG tablet  Commonly known as:  COZAAR     predniSONE 10 MG tablet  Commonly known as:  DELTASONE      TAKE these medications        cephALEXin 500 MG capsule  Commonly known as:  KEFLEX  Take 1 capsule (500 mg total) by mouth every 12 (twelve) hours.     FISH OIL PO  Take 1 capsule by mouth daily.     Fluticasone-Salmeterol 100-50 MCG/DOSE Aepb  Commonly known as:  ADVAIR  Inhale 1 puff into the lungs 2 (two) times daily.     hydrochlorothiazide 12.5 MG capsule  Commonly known as:  MICROZIDE  Take 12.5 mg by mouth daily.     Levothyroxine Sodium 25 MCG Caps  Take by mouth daily before breakfast.     magnesium oxide 400 (241.3 MG) MG tablet  Commonly known as:  MAG-OX  Take 1 tablet (400 mg total) by mouth daily.     naproxen sodium 220 MG tablet  Commonly known as:  ANAPROX  Take 220 mg by mouth 2 (two) times daily as needed (pain).     saccharomyces boulardii 250 MG capsule  Commonly known as:  FLORASTOR  Take 1 capsule (250 mg total) by mouth 2 (two) times daily.     traMADol 50 MG tablet  Commonly known as:  ULTRAM  Take 1 tablet (50 mg total) by mouth every 6 (six) hours as needed for moderate pain.        Filed Vitals:   02/10/15 0547  BP: 124/71  Pulse: 83  Temp: 98.8 F (37.1 C)  Resp: 18    Skin clean, dry and intact without evidence of skin break down, no evidence of skin tears noted. IV catheter discontinued intact. Site without signs and symptoms of complications. Dressing and pressure applied. Pt denies pain at this time. No complaints noted.  An After Visit Summary was printed and given to the  patient. Patient escorted via [place], and D/C home via private auto.  [name] S 02/10/2015 1:32 PM

## 2015-02-10 NOTE — Progress Notes (Signed)
VASCULAR LAB PRELIMINARY  PRELIMINARY  PRELIMINARY  PRELIMINARY  Left lower extremity venous duplex completed.    Preliminary report:  Left:  No evidence of DVT, superficial thrombosis, or Baker's cyst. Mild to moderate interstitial fluid noted in the distal calf through the foot.  [name], RVS 02/10/2015, 10:51 AM

## 2015-02-10 NOTE — Progress Notes (Signed)
[facility] Progress Note  7 Days Post-Op  Subjective: Pt doing great.  Having BM's and flatus in bag.  Tolerating soft diet.  Ambulating well.  Pending SNF discharge today she says.  No N/V.  Just a bit sore at her incision, but dressing changes going well.  Objective: Vital signs in last 24 hours: Temp:  [98 F (36.7 C)-98.8 F (37.1 C)] 98.8 F (37.1 C) (11/16 0547) Pulse Rate:  [61-83] 83 (11/16 0547) Resp:  [18] 18 (11/16 0547) BP: (124-128)/(58-71) 124/71 mmHg (11/16 0547) SpO2:  [95 %-99 %] 97 % (11/16 1015) Weight:  [74.7 kg (164 lb 10.9 oz)] 74.7 kg (164 lb 10.9 oz) (11/16 0547) Last BM Date: 02/09/15  Intake/Output from previous day: 11/15 0701 - 11/16 0700 In: -  Out: 300 [Urine:300] Intake/Output this shift:    PE: Gen:  Alert, NAD, pleasant Abd: Soft, appropriately tender, ND, +BS, no HSM, midline wound clean with beefy red tissue.  Wound with close proximity to ostomy so we can't get a wound vac.  Ostomy is pink, patent with brown soft stools and flatus.    Lab Results:   Recent Labs  02/09/15 0525  WBC 7.7  HGB 10.0*  HCT 30.8*  PLT 183   BMET  Recent Labs  02/09/15 0525 02/10/15 0540  NA 139 136  K 3.8 3.7  CL 107 102  CO2 26 28  GLUCOSE 87 85  BUN 20 19  CREATININE 0.88 0.92  CALCIUM 8.7* 8.3*   PT/INR No results for input(s): LABPROT, INR in the last 72 hours. CMP     Component Value Date/Time   NA 136 02/10/2015 0540   K 3.7 02/10/2015 0540   CL 102 02/10/2015 0540   CO2 28 02/10/2015 0540   GLUCOSE 85 02/10/2015 0540   BUN 19 02/10/2015 0540   CREATININE 0.92 02/10/2015 0540   CALCIUM 8.3* 02/10/2015 0540   PROT 4.0* 02/04/2015 0641   ALBUMIN 1.8* 02/04/2015 0641   AST 53* 02/04/2015 0641   ALT 93* 02/04/2015 0641   ALKPHOS 66 02/04/2015 0641   BILITOT 1.2 02/04/2015 0641   GFRNONAA 55* 02/10/2015 0540   GFRAA >60 02/10/2015 0540   Lipase  No results found for: LIPASE     Studies/Results: Dg Chest  Port 1 View  02/08/2015  CLINICAL DATA:  History of asthma, hypertension. Followup exam for CHF EXAM: PORTABLE CHEST 1 VIEW COMPARISON:  02/07/2015 FINDINGS: Opacity in the lung bases has significantly improved. There is less interstitial thickening. There are no new areas of lung opacity. Cardiac silhouette is mildly enlarged. Moderate hiatal hernia is stable. No pneumothorax. IMPRESSION: 1. Improved lung aeration when compared to the prior exam. Decreased lung base opacity is consistent with decrease sizes of pleural effusions. There is less interstitial thickening consistent with improved interstitial pulmonary edema. Overall the findings are most consistent with improved congestive heart failure. Electronically Signed   By: Lajean Manes M.D.   On: 02/08/2015 12:29    Anti-infectives: Anti-infectives    Start     Dose/Rate Route Frequency Ordered Stop   02/09/15 1200  cephALEXin (KEFLEX) capsule 500 mg     500 mg Oral Every 12 hours 02/09/15 1112     02/08/15 2200  meropenem (MERREM) 1 g in sodium chloride 0.9 % 100 mL IVPB  Status:  Discontinued     1 g 200 mL/hr over 30 Minutes Intravenous Every 12 hours 02/08/15 2007 02/09/15 1104   02/04/15 2359  levofloxacin (LEVAQUIN) IVPB  750 mg  Status:  Discontinued     750 mg 100 mL/hr over 90 Minutes Intravenous Every 48 hours 02/03/15 0149 02/03/15 0359   02/04/15 2359  levofloxacin (LEVAQUIN) IVPB 750 mg  Status:  Discontinued     750 mg 100 mL/hr over 90 Minutes Intravenous Every 48 hours 02/03/15 0404 02/03/15 0948   02/04/15 1800  anidulafungin (ERAXIS) 100 mg in sodium chloride 0.9 % 100 mL IVPB  Status:  Discontinued     100 mg over 90 Minutes Intravenous Every 24 hours 02/03/15 1844 02/08/15 1817   02/04/15 0000  levofloxacin (LEVAQUIN) IVPB 750 mg  Status:  Discontinued     750 mg 100 mL/hr over 90 Minutes Intravenous Every 48 hours 02/03/15 1847 02/03/15 1852   02/03/15 2359  vancomycin (VANCOCIN) IVPB 1000 mg/200 mL premix  Status:   Discontinued     1,000 mg 200 mL/hr over 60 Minutes Intravenous Every 24 hours 02/03/15 0215 02/03/15 0359   02/03/15 2000  meropenem (MERREM) 1 g in sodium chloride 0.9 % 100 mL IVPB  Status:  Discontinued     1 g 200 mL/hr over 30 Minutes Intravenous Every 12 hours 02/03/15 1856 02/08/15 1817   02/03/15 2000  vancomycin (VANCOCIN) 500 mg in sodium chloride 0.9 % 100 mL IVPB  Status:  Discontinued     500 mg 100 mL/hr over 60 Minutes Intravenous Every 12 hours 02/03/15 1856 02/05/15 0838   02/03/15 1845  anidulafungin (ERAXIS) 200 mg in sodium chloride 0.9 % 200 mL IVPB     200 mg over 180 Minutes Intravenous STAT 02/03/15 1844 02/03/15 2214   02/03/15 1030  [MAR Hold]  meropenem (MERREM) 500 mg in sodium chloride 0.9 % 50 mL IVPB  Status:  Discontinued     (MAR Hold since 02/03/15 1435)   500 mg 100 mL/hr over 30 Minutes Intravenous 2 times daily 02/03/15 1004 02/03/15 1856   02/03/15 1000  aztreonam (AZACTAM) 2 g in dextrose 5 % 50 mL IVPB  Status:  Discontinued     2 g 100 mL/hr over 30 Minutes Intravenous Every 8 hours 02/03/15 0149 02/03/15 0359   02/03/15 1000  ertapenem (INVANZ) 1 g in sodium chloride 0.9 % 50 mL IVPB  Status:  Discontinued     1 g 100 mL/hr over 30 Minutes Intravenous Every 24 hours 02/03/15 0948 02/03/15 1003   02/03/15 0415  [MAR Hold]  metroNIDAZOLE (FLAGYL) IVPB 500 mg  Status:  Discontinued     (MAR Hold since 02/03/15 1435)   500 mg 100 mL/hr over 60 Minutes Intravenous 3 times per day 02/03/15 0359 02/03/15 1852   02/03/15 0400  levofloxacin (LEVAQUIN) IVPB 750 mg  Status:  Discontinued     750 mg 100 mL/hr over 90 Minutes Intravenous  Once 02/03/15 0359 02/03/15 0402   02/03/15 0030  levofloxacin (LEVAQUIN) IVPB 750 mg     750 mg 100 mL/hr over 90 Minutes Intravenous  Once 02/03/15 0022 02/03/15 0231   02/03/15 0030  aztreonam (AZACTAM) 2 g in dextrose 5 % 50 mL IVPB     2 g 100 mL/hr over 30 Minutes Intravenous  Once 02/03/15 0022 02/03/15 0131    02/03/15 0030  vancomycin (VANCOCIN) IVPB 1000 mg/200 mL premix     1,000 mg 200 mL/hr over 60 Minutes Intravenous  Once 02/03/15 0022 02/03/15 0229       Assessment/Plan Pneumoperitoneum 2*Perforated diverticulitis POD #7 s/p Diagnostic laparoscopy, converted to open sigmoid colectomy with takedown of splenic flexure  and end colostomy  -On soft diet and tolerating this well -Peritoneal culture grew rare streptococcus group D, candida albicans, Dr. Karleen Hampshire discussed with Dr. Tommy Medal and they started her on keflex for a 10 day course today is day #2 -WOC nursing/ostomy care to continue at SNF -Dressing changes - Fascia is closed, WD BID to open midline wound, wound vac not possible secondary to proximity of wound to ostomy -Will arrange for follow up with Dr. Barry Dienes in 2-3 weeks, will arrange for her to get a referral for a GI in 6-8 weeks.  She will need to have a coloscopy secondary to finding Strep Bovis on her cultures due to risk of cancer.  Will have office arrange this.  Volume overload with diuresis 02/07/15 Large Hiatal hernia H/o multiple abdominal surgeries - cholecystectomy, bladder surgery, abd hysterectomy DNR, lives at independent living H/o ocular melanoma HTN Hypothyroidism Asthma with recent bronchitis treated with prednisone taper Antibiotics: Meropenem 5 days, vancomycin 3 days, anidulafungin 5 days (all have been discontinued), now on Keflex Day #2 DVT: Heparin/SCD, LE duplex pending Disp:  Surgically stable for d/c when medically ready for d/c to SNF    LOS: 7 days    Nat Christen 02/10/2015, 10:54 AM Pager: [phone]

## 2015-02-10 NOTE — Progress Notes (Signed)
CSw has arranged for patient to be transported to facility via [facility]. Patient and family is aware of transfer. No further CSW needs requested at this time. CSW to sign off.   Lucius Conn, [place] Social Worker [place] [phone]

## 2015-02-10 NOTE — Discharge Summary (Addendum)
Discharge Summary  Pamela Mejia [id number] DOB: 1926/11/08  PCP: Myriam Jacobson, MD  Admit date: 02/02/2015 Discharge date: 02/10/2015  Time spent: >72mins  Recommendations for Outpatient Follow-up:  1. F/u with PMD at SNF, repeat bmp/mag in a week 2. F/u with general surgery Dr. Barry Dienes in 2-3 weeks, post op follow up. General surgery will arrange patient to see  Gastroenterology in 2 -3 months for colonoscopy ( possible strep bovis in peritoneal fluids)  Discharge Diagnoses:  Active Hospital Problems   Diagnosis Date Noted  . Peritonitis ([place]) 02/03/2015  . Asthma, moderate persistent 02/03/2015  . Hypothyroidism 02/03/2015  . Essential hypertension 02/03/2015    Resolved Hospital Problems   Diagnosis Date Noted Date Resolved  No resolved problems to display.    Discharge Condition: stable  Diet recommendation: heart healthy  Filed Weights   02/05/15 0500 02/08/15 1000 02/10/15 0547  Weight: 161 lb 2.5 oz (73.1 kg) 165 lb 12.6 oz (75.2 kg) 164 lb 10.9 oz (74.7 kg)    History of present illness:  Pamela Mejia is a 81 y.o. female with a past medical history significant for HTN, asthma persistent, and hypothyroidism who presents with acute abdominal pain.  The patient was in her usual state of health until 24 hours ago when she developed "crampy" colicky lower abdominal pain. She had an uncomfortable night, and this morning had decreased appetite and nausea, but was able to eat and be somewhat active. Then tonight around 8p, the pain became severe, constant and moved to the left side so she came to the ER.  In the ED, the patietn had lactic acid 2.6 mmol/L, leukocytosis 24.5K/uL, and hypotensive and a CT of the abdomen and pelvis showed pneumoperitoneum with presumed perforated diverticulitis, location uncertain. The renal function was normal. General surgery were consulted who asked that the patient be treated medically with the hope for non-operative  management.  Hospital Course:  Principal Problem:   Peritonitis ([place]) Active Problems:   Asthma, moderate persistent   Hypothyroidism   Essential hypertension 1. Diverticulitis , with bowel perforation: s/p surgical repair on 11/9, with colectomy and colostomy. - she was initially started on levaquin and flagyl which were discontinued, later on started on vancomycin and meropenam. She was started on clear liquids and advanced to full liquid today. She tolerated the diet without any issues. No nausea or vomited. Advanced diet as per surgery. Cleared for discharge today on 11/16 by surgery.  2. Septic shock: resolved with fluid resuscitation and pressors, abx.  procalcitonin improving.  Leukocytosis has resolved.  Peritoneal cultures grew strep D , possibly strep Bovis, discussed with Dr Tommy Medal and plan to give her keflex to complete the course for 10 days. General surgery will arrange GI follow up for colonoscopy 6-8 weeks after surgery.  3. Hypothyroidism: Resume synthroid.   4. COPD: worsening wheezing and increased work of breathing on 11/13, she appears fluid overloaded too, with pedal edema and edema of the upper extremities, fluids were discontinued and she was given neb treatment. Stat portable CXR ordered. Her oxygen sats remain stable around 95% on 3lit of [place] oxygen. CXR shows CHF, gave her lasix 20 mg IV twice during the day , followed by po from 11/14, repeat CXR showed improvement. She is currently off oxygen and is much better. S/p 20 mg of po lasix on 11/15, restarted home meds hctz at discharge.   5. Anemia:  Anemia of blood loss from the surgery. Stable. Monitor periodically.   6. Hypokalemia replete  as needed.   7. Hypomagnesemia; replete with po mag oxide and iv mag,  Repeat lab in a week  8. Fluids overload, with lower extremity edema, worse on left lower extremity, venous US no DVT, continue hctz.  9. H/o htn: home bp meds held entire hospitalization, hctz  restarted at discharge, pmd to monitor blood pressure, consider restart norvasc and cozaar when appropriate.   10. H/o asthma: no wheezing at discharge, no room air, outpatient pulmonology follow up.  Code Status: DNR Family Communication: patient  Disposition Plan: SNF.    Consultants:  Surgery  PCCM. AS PRIMARY till 11/11  ID Dr Tommy Medal over the phone.  Procedures:  Sigmoid colectomy on 11/9  Antibiotics:  meropenam 11/9-11/14 Vancomycin for 3 days. Keflex for total of 10days   Discharge Exam: BP 124/71 mmHg  Pulse 83  Temp(Src) 98.8 F (37.1 C) (Oral)  Resp 18  Ht 5' (1.524 m)  Wt 164 lb 10.9 oz (74.7 kg)  BMI 32.16 kg/m2  SpO2 97%  General: * Cardiovascular: * Respiratory: *  Discharge Instructions You were cared for by a hospitalist during your hospital stay. If you have any questions about your discharge medications or the care you received while you were in the hospital after you are discharged, you can call the unit and asked to speak with the hospitalist on call if the hospitalist that took care of you is not available. Once you are discharged, your primary care physician will handle any further medical issues. Please note that NO REFILLS for any discharge medications will be authorized once you are discharged, as it is imperative that you return to your primary care physician (or establish a relationship with a primary care physician if you do not have one) for your aftercare needs so that they can reassess your need for medications and monitor your lab values.      Discharge Instructions    Diet - low sodium heart healthy    Complete by:  As directed      Increase activity slowly    Complete by:  As directed             Medication List    STOP taking these medications        amLODipine 5 MG tablet  Commonly known as:  NORVASC     losartan 100 MG tablet  Commonly known as:  COZAAR     predniSONE 10 MG tablet  Commonly known as:  DELTASONE        TAKE these medications        cephALEXin 500 MG capsule  Commonly known as:  KEFLEX  Take 1 capsule (500 mg total) by mouth every 12 (twelve) hours.     FISH OIL PO  Take 1 capsule by mouth daily.     Fluticasone-Salmeterol 100-50 MCG/DOSE Aepb  Commonly known as:  ADVAIR  Inhale 1 puff into the lungs 2 (two) times daily.     hydrochlorothiazide 12.5 MG capsule  Commonly known as:  MICROZIDE  Take 12.5 mg by mouth daily.     Levothyroxine Sodium 25 MCG Caps  Take by mouth daily before breakfast.     magnesium oxide 400 (241.3 MG) MG tablet  Commonly known as:  MAG-OX  Take 1 tablet (400 mg total) by mouth daily.     naproxen sodium 220 MG tablet  Commonly known as:  ANAPROX  Take 220 mg by mouth 2 (two) times daily as needed (pain).  saccharomyces boulardii 250 MG capsule  Commonly known as:  FLORASTOR  Take 1 capsule (250 mg total) by mouth 2 (two) times daily.     traMADol 50 MG tablet  Commonly known as:  ULTRAM  Take 1 tablet (50 mg total) by mouth every 6 (six) hours as needed for moderate pain.       Allergies  Allergen Reactions  . Penicillins Shortness Of Breath    Patient is unable to answer PCN questions at this time.   . Tylenol With Codeine #3 [Acetaminophen-Codeine]     unknown   Follow-up Information    Follow up with BYERLY,FAERA, MD. Schedule an appointment as soon as possible for a visit in 2 weeks.   Specialty:  General Surgery   Why:  For post-operation check.  Call to confirm the appointment date/time with Dr. Barry Dienes within 2-3 weeks.   Contact information:   [address] [place] [address] [phone]       Follow up with Rigoberto Noel., MD In 3 weeks.   Specialty:  Pulmonary Disease   Why:  asthma   Contact information:   [address] [address] [phone]        The results of significant diagnostics from this hospitalization (including imaging, microbiology, ancillary and laboratory)  are listed below for reference.    Significant Diagnostic Studies: Ct Abdomen Pelvis Wo Contrast  02/03/2015  CLINICAL DATA:  Abdominal pain EXAM: CT ABDOMEN AND PELVIS WITHOUT CONTRAST TECHNIQUE: Multidetector CT imaging of the abdomen and pelvis was performed following the standard protocol without IV contrast. COMPARISON:  None. FINDINGS: Lower chest and abdominal wall: Large hiatal hernia with gas fluid retention. Hepatobiliary: No focal liver abnormality.Cholecystectomy unremarkable common bile duct diameter. Pancreas: Unremarkable. Spleen: Unremarkable. Adrenals/Urinary Tract: Negative adrenals. No hydronephrosis or stone. Bladder descent from pelvic floor laxity. Reproductive:Pelvic floor laxity with fluid density in the anterior perineum likely reflecting ascites when correlated with sagittal reformats. Hysterectomy. Negative adnexae. Stomach/Bowel: Moderate pneumoperitoneum distributed throughout the abdomen consistent with bowel perforation. The exact site of perforation is not identified, although a distal colonic diverticular source (extensive colonic diverticulosis) is favored given lack of oral contrast extravasation (which reaches the a proximal ascending colon), pelvic fat inflammatory changes, and ascitic fluid accumulation in the pelvis. No thickening of the stomach or duodenum. No bowel obstruction. Negative appendix. Pelvic ascites which is small volume but loculated in the bilateral and deep pelvis where collections measure 3 to 5 cm diameter. Vascular/Lymphatic: No acute vascular abnormality. No mass or adenopathy. Musculoskeletal: L4-5 facet arthropathy with grade 1 anterolisthesis. Critical Value/emergent results were called by telephone at the time of interpretation on 02/03/2015 at 1:37 am to Dr. Josephina Gip , who verbally acknowledged these results. IMPRESSION: 1. Bowel perforation with moderate pneumoperitoneum. Source is uncertain but perforated colonic diverticulum is favored. No  extravasation of oral contrast which reaches the ascending colon. Small ascites with loculation in the pelvis. 2. Large hiatal hernia. Electronically Signed   By: Monte Fantasia M.D.   On: 02/03/2015 01:38   Dg Chest Port 1 View  02/08/2015  CLINICAL DATA:  History of asthma, hypertension. Followup exam for CHF EXAM: PORTABLE CHEST 1 VIEW COMPARISON:  02/07/2015 FINDINGS: Opacity in the lung bases has significantly improved. There is less interstitial thickening. There are no new areas of lung opacity. Cardiac silhouette is mildly enlarged. Moderate hiatal hernia is stable. No pneumothorax. IMPRESSION: 1. Improved lung aeration when compared to the prior exam. Decreased  lung base opacity is consistent with decrease sizes of pleural effusions. There is less interstitial thickening consistent with improved interstitial pulmonary edema. Overall the findings are most consistent with improved congestive heart failure. Electronically Signed   By: Lajean Manes M.D.   On: 02/08/2015 12:29   Dg Chest Port 1 View  02/07/2015  CLINICAL DATA:  Decreased O2 sats, change in mental status, labored breathing and weakness peer EXAM: PORTABLE CHEST 1 VIEW COMPARISON:  Chest x-ray dated 02/02/2015. FINDINGS: Cardiomegaly is grossly unchanged. Again noted is the large hiatal hernia within the lower mediastinum. There is new central pulmonary vascular congestion and bilateral interstitial edema consistent with congestive heart failure/ volume overload. There are new bilateral pleural effusions, small to moderate in size. IMPRESSION: 1. Cardiomegaly with new central pulmonary vascular congestion and new bilateral interstitial edema consistent with CHF/volume overload. This is indicative of worsened fluid status. 2. New bilateral pleural effusions, small to moderate in size. 3. Large hiatal hernia. Electronically Signed   By: Franki Cabot M.D.   On: 02/07/2015 10:50   Dg Abd Acute W/chest  02/02/2015  CLINICAL DATA:   Abdominal pain EXAM: DG ABDOMEN ACUTE W/ 1V CHEST COMPARISON:  None. FINDINGS: Borderline cardiomegaly. There is a large hiatal hernia with gas fluid level. Negative aortic and hilar contours. There is no edema, consolidation, effusion, or pneumothorax. Nonobstructive small bowel and colonic gas pattern. Moderate stool burden with formed stool from the splenic flexure to the rectum. No concerning intra-abdominal mass effect or calcification. Cholecystectomy changes. No pneumoperitoneum. IMPRESSION: 1. Large hiatal hernia with fluid level. 2. No evidence of acute cardiopulmonary disease. 3. Moderate stool volume. Electronically Signed   By: Monte Fantasia M.D.   On: 02/02/2015 22:59   Dg Abd Portable 1v  02/03/2015  CLINICAL DATA:  Check nasogastric catheter placement EXAM: PORTABLE ABDOMEN - 1 VIEW COMPARISON:  CT from earlier in the same day FINDINGS: Scattered large and small bowel gas is noted. A nasogastric catheter is not visualized on this exam. A large hiatal hernia is seen. Contrast material is noted within the bowel from previous CT. Changes of a left lower quadrant ostomy are now noted. IMPRESSION: Large hiatal hernia.  No nasogastric catheter is seen. Electronically Signed   By: Inez Catalina M.D.   On: 02/03/2015 18:58    Microbiology: Recent Results (from the past 240 hour(s))  Blood Culture (routine x 2)     Status: None   Collection Time: 02/03/15 12:47 AM  Result Value Ref Range Status   Specimen Description BLOOD RIGHT ANTECUBITAL  Final   Special Requests IN PEDIATRIC BOTTLE 2ML  Final   Culture   Final    NO GROWTH 5 DAYS Performed at [facility]    Report Status 02/08/2015 FINAL  Final  Blood Culture (routine x 2)     Status: None   Collection Time: 02/03/15 12:50 AM  Result Value Ref Range Status   Specimen Description BLOOD LEFT ANTECUBITAL  Final   Special Requests BOTTLES DRAWN AEROBIC AND ANAEROBIC 5ML  Final   Culture   Final    NO GROWTH 5 DAYS Performed at  [facility]    Report Status 02/08/2015 FINAL  Final  MRSA PCR Screening     Status: None   Collection Time: 02/03/15  4:17 AM  Result Value Ref Range Status   MRSA by PCR NEGATIVE NEGATIVE Final    Comment:        The GeneXpert MRSA Assay (FDA approved  for NASAL specimens only), is one component of a comprehensive MRSA colonization surveillance program. It is not intended to diagnose MRSA infection nor to guide or monitor treatment for MRSA infections.   Urine culture     Status: None   Collection Time: 02/03/15 10:20 AM  Result Value Ref Range Status   Specimen Description URINE, CATHETERIZED  Final   Special Requests NONE  Final   Culture   Final    NO GROWTH 1 DAY Performed at [facility]    Report Status 02/04/2015 FINAL  Final  Anaerobic culture     Status: None   Collection Time: 02/03/15  5:06 PM  Result Value Ref Range Status   Specimen Description PERITONEAL  Final   Special Requests NONE  Final   Gram Stain   Final    MODERATE WBC PRESENT,BOTH PMN AND MONONUCLEAR NO SQUAMOUS EPITHELIAL CELLS SEEN RARE GRAM POSITIVE COCCI IN PAIRS Performed at [facility]    Culture   Final    NO ANAEROBES ISOLATED Performed at [facility]    Report Status 02/08/2015 FINAL  Final  Body fluid culture     Status: None   Collection Time: 02/03/15  5:06 PM  Result Value Ref Range Status   Specimen Description FLUID PERITONEAL  Final   Special Requests SWAB  Final   Gram Stain   Final    MODERATE WBC PRESENT,BOTH PMN AND MONONUCLEAR GRAM NEGATIVE RODS GRAM POSITIVE RODS GRAM POSITIVE COCCI IN CHAINS IN PAIRS YEAST    Culture   Final    RARE STREPTOCOCCUS GROUP D;high probability for S.bovis RARE CANDIDA ALBICANS Performed at [facility]    Report Status 02/08/2015 FINAL  Final   Organism ID, Bacteria STREPTOCOCCUS GROUP D;high probability for S.bovis  Final      Susceptibility   Streptococcus group d;high probability for  s.bovis - MIC*    PENICILLIN Value in next row       0.125SENSITIVE    * RARE STREPTOCOCCUS GROUP D;high probability for S.bovis  Culture, blood (single)     Status: None   Collection Time: 02/03/15  7:35 PM  Result Value Ref Range Status   Specimen Description BLOOD LEFT HAND  Final   Special Requests IN PEDIATRIC BOTTLE  .5CC  Final   Culture   Final    NO GROWTH 5 DAYS Performed at [facility]    Report Status 02/08/2015 FINAL  Final     Labs: Basic Metabolic Panel:  Recent Labs Lab 02/04/15 0641 02/05/15 0340  02/06/15 0525 02/07/15 0524 02/08/15 1139 02/09/15 0525 02/10/15 0540  NA 139  --   < > 141 144 141 139 136  K 3.7  --   < > 4.1 4.8 4.3 3.8 3.7  CL 118*  --   < > 117* 119* 111 107 102  CO2 16*  --   < > 20* 21* 24 26 28   GLUCOSE 84  --   < > 105* 87 81 87 85  BUN 19  --   < > 28* 23* 19 20 19   CREATININE 0.96  --   < > 0.98 0.82 0.72 0.88 0.92  CALCIUM 8.0*  --   < > 8.8* 8.9 8.5* 8.7* 8.3*  MG 1.1* 2.2  --   --   --  1.2*  --  1.1*  PHOS 3.2 3.1  --   --   --   --   --   --   < > =  values in this interval not displayed. Liver Function Tests:  Recent Labs Lab 02/04/15 0641  AST 53*  ALT 93*  ALKPHOS 66  BILITOT 1.2  PROT 4.0*  ALBUMIN 1.8*   No results for input(s): LIPASE, AMYLASE in the last 168 hours. No results for input(s): AMMONIA in the last 168 hours. CBC:  Recent Labs Lab 02/03/15 2125 02/04/15 0345 02/05/15 0340 02/06/15 0525 02/07/15 0524 02/09/15 0525  WBC 12.2* 11.2* 11.4* 10.9* 7.5 7.7  NEUTROABS 11.7*  --   --   --   --   --   HGB 11.9* 10.3* 9.5* 9.0* 9.9* 10.0*  HCT 37.3 32.1* 28.3* 28.0* 30.8* 30.8*  MCV 95.4 95.0 93.7 93.0 94.8 93.3  PLT 185 147* 154 166 174 183   Cardiac Enzymes: No results for input(s): CKTOTAL, CKMB, CKMBINDEX, TROPONINI in the last 168 hours. BNP: BNP (last 3 results) No results for input(s): BNP in the last 8760 hours.  ProBNP (last 3 results) No results for input(s): PROBNP in  the last 8760 hours.  CBG: No results for input(s): GLUCAP in the last 168 hours.     SignedFlorencia Reasons MD, PhD  [facility] 02/10/2015, 11:50 AM

## 2015-02-10 NOTE — Progress Notes (Signed)
CSW followed up with patient and family regarding facility preference. Patient and family have chosen bed at [facility]. CSW contacted facility to inform of patient and family's decision. Facility is able to take patient on today. Family is unable to get to facility before 1:00pm. Patient is able to sign herself into facility. Per MD Erlinda Hong, waiting on ultrasound results. If negative, patient can be discharged to facility.   Patient to be transported to facility via [facility] around 1:00pm. Discharge summary to be sent to facility once available.   Lucius Conn, [place] Social Worker [place] [phone]

## 2015-03-10 ENCOUNTER — Encounter: Payer: Self-pay | Admitting: Pulmonary Disease

## 2015-03-10 ENCOUNTER — Ambulatory Visit (INDEPENDENT_AMBULATORY_CARE_PROVIDER_SITE_OTHER): Payer: Medicare Other | Admitting: Pulmonary Disease

## 2015-03-10 VITALS — BP 130/72 | HR 66 | Ht 60.0 in | Wt 139.2 lb

## 2015-03-10 DIAGNOSIS — J4540 Moderate persistent asthma, uncomplicated: Secondary | ICD-10-CM | POA: Diagnosis not present

## 2015-03-10 NOTE — Patient Instructions (Signed)
Continue using the Advair as prescribed. We will schedule for lung function test.  Return to clinic in 6 months.

## 2015-03-10 NOTE — Progress Notes (Signed)
   Subjective:    Patient ID: Pamela Mejia, female    DOB: 1926/09/24, 81 y.o.   MRN: [id number]  HPI Follow-up after hospitalization.   Pamela Mejia is a 81 year old with history of childhood asthma. She was hospitalized in November with abdominal pain. She was found to have a pneumoperitoneum with perforated diverticulitis, septic shock. She underwent laparoscopy which was converted to an open sigmoid colectomy and end colostomy. She improved and was discharged on 11/16. She has history of asthma but this wasn't an issue during her hospitalization last month. She is here for follow-up.  She reports that she has asthma since the age of 81. She seen several lung doctors in the past but not recently in [place]. She is maintained on Advair 100/50 and is stable on that regimen. She has dyspnea on exertion, occasional wheezing. She denies any cough, sputum, hemoptysis, fevers, chills.  Past Medical History  Diagnosis Date  . Asthma   . Hypothyroidism   . Hypertension   . Cancer ([place])     Ocular melanoma     Current outpatient prescriptions:  .  cephALEXin (KEFLEX) 500 MG capsule, Take 1 capsule (500 mg total) by mouth every 12 (twelve) hours., Disp: 16 capsule, Rfl: 0 .  Fluticasone-Salmeterol (ADVAIR) 100-50 MCG/DOSE AEPB, Inhale 1 puff into the lungs 2 (two) times daily., Disp: , Rfl:  .  hydrochlorothiazide (MICROZIDE) 12.5 MG capsule, Take 12.5 mg by mouth daily., Disp: , Rfl:  .  Levothyroxine Sodium 25 MCG CAPS, Take by mouth daily before breakfast., Disp: , Rfl:  .  magnesium oxide (MAG-OX) 400 (241.3 MG) MG tablet, Take 1 tablet (400 mg total) by mouth daily., Disp: 30 tablet, Rfl: 0 .  naproxen sodium (ANAPROX) 220 MG tablet, Take 220 mg by mouth 2 (two) times daily as needed (pain)., Disp: , Rfl:  .  Omega-3 Fatty Acids (FISH OIL PO), Take 1 capsule by mouth daily., Disp: , Rfl:  .  saccharomyces boulardii (FLORASTOR) 250 MG capsule, Take 1 capsule (250 mg total) by mouth 2 (two)  times daily., Disp: 30 capsule, Rfl: 0  Review of Systems Anes of dyspnea on exertion, wheezing, shortness of breath. Denies cough, sputum production, hemoptysis. Denies any fevers, chills, loss of weight, appetite, malaise, fatigue. Denies any nausea, vomiting, diarrhea, constipation. Denies any chest pain, palpitations. All other review of systems are negative.    Objective:   Physical Exam  Blood pressure 130/72, pulse 66, height 5' (1.524 m), weight 139 lb 3.2 oz (63.141 kg), SpO2 97 %. Gen: No apparent distress Neuro: No gross focal deficits. Neck: No JVD, lymphadenopathy, thyromegaly. RS: Clear, no wheeze, crackles.. CVS: S1-S2 heard, no murmurs rubs gallops. Abdomen: Soft, positive bowel sounds. Extremities: No edema.    Assessment & Plan:  Asthma  She has history of childhood asthma. She appears well controlled on current Advair 100/50. She was hospitalized last month and underwent surgery under general anesthesia. During this time she remained stable with no exacerbations of her asthma symptoms. If she should worsen then we can consider checking IgE levels, CBC with differential to see if she needs any additional therapies.  We will continue the same inhaler as prescribed. She will get pulmonary function test to get an assessment of her lung function at baseline.  Plan: - Continue Advair 100/50. - Pulmonary function test.  Return to clinic in 6 month.  .pm

## 2015-04-26 ENCOUNTER — Ambulatory Visit
Admission: RE | Admit: 2015-04-26 | Discharge: 2015-04-26 | Disposition: A | Discharge status: Home / Self Care | Payer: Medicare Other | Source: Ambulatory Visit | Attending: General Surgery | Admitting: General Surgery

## 2015-04-26 ENCOUNTER — Telehealth: Payer: Self-pay | Admitting: General Surgery

## 2015-04-26 ENCOUNTER — Other Ambulatory Visit: Payer: Self-pay | Admitting: General Surgery

## 2015-04-26 DIAGNOSIS — K433 Parastomal hernia with obstruction, without gangrene: Secondary | ICD-10-CM

## 2015-04-26 MED ORDER — IOPAMIDOL (ISOVUE-300) INJECTION 61%
100.0000 mL | Freq: Once | INTRAVENOUS | Status: AC | PRN
  Administered 2015-04-26: 100 mL via INTRAVENOUS

## 2015-04-26 NOTE — Telephone Encounter (Signed)
Discussed findings with patient.  Non obstructed parastomal hernia.    + subacute dvt.  Have sent message to PCP and will call in AM.

## 2015-05-03 ENCOUNTER — Encounter: Payer: Self-pay | Admitting: Gastroenterology

## 2015-07-02 ENCOUNTER — Ambulatory Visit (INDEPENDENT_AMBULATORY_CARE_PROVIDER_SITE_OTHER): Payer: Medicare Other | Admitting: Gastroenterology

## 2015-07-02 ENCOUNTER — Encounter: Payer: Self-pay | Admitting: Gastroenterology

## 2015-07-02 VITALS — BP 124/70 | HR 68 | Ht 59.5 in | Wt 134.1 lb

## 2015-07-02 DIAGNOSIS — K5732 Diverticulitis of large intestine without perforation or abscess without bleeding: Secondary | ICD-10-CM | POA: Diagnosis not present

## 2015-07-02 DIAGNOSIS — Z933 Colostomy status: Secondary | ICD-10-CM | POA: Diagnosis not present

## 2015-07-02 MED ORDER — NA SULFATE-K SULFATE-MG SULF 17.5-3.13-1.6 GM/177ML PO SOLN: 1.0000 | Freq: Once | ORAL | Status: DC

## 2015-07-02 NOTE — Patient Instructions (Signed)
You have been scheduled for a colonoscopy. Please follow written instructions given to you at your visit today.  Please pick up your prep supplies at the pharmacy within the next 1-3 days. If you use inhalers (even only as needed), please bring them with you on the day of your procedure. Your physician has requested that you go to www.startemmi.com and enter the access code given to you at your visit today. This web site gives a general overview about your procedure. However, you should still follow specific instructions given to you by our office regarding your preparation for the procedure.  We have added a rectal enema to your suprep colonoscopy instructions also  You will follow up based on results of your colonoscopy

## 2015-07-15 NOTE — Progress Notes (Signed)
Pamela Mejia    [id number]    Jul 19, 1926  Primary Care Physician:ROBERTS, Sharol Given, MD  Referring Physician: Lorene Dy, MD [address], [place], [place] [address]  Chief complaint:  S/p perforated diverticulitis HPI: 86 yr F here for new patient evaluation. She had perforated diverticulitis in Nov 2016, underwent laparoscopy which was converted to an open sigmoid colectomy and end colostomy. Subsequent course complicated by DVT and is currently on anticoagulation with Eliquis. Denies any nausea, vomiting, abdominal pain, melena or bright red blood per rectum. She is doing well and is interested to getting reversal of colostomy.     Outpatient Encounter Prescriptions as of 07/02/2015  Medication Sig  . apixaban (ELIQUIS) 5 MG TABS tablet Take 5 mg by mouth 2 (two) times daily.  . Fluticasone-Salmeterol (ADVAIR) 100-50 MCG/DOSE AEPB Inhale 1 puff into the lungs 2 (two) times daily.  . hydrochlorothiazide (MICROZIDE) 12.5 MG capsule Take 12.5 mg by mouth daily.  . Levothyroxine Sodium 25 MCG CAPS Take by mouth daily before breakfast.  . Omega-3 Fatty Acids (FISH OIL PO) Take 1 capsule by mouth daily.  . Na Sulfate-K Sulfate-Mg Sulf (SUPREP BOWEL PREP) SOLN Take 1 kit by mouth once.  . [DISCONTINUED] cephALEXin (KEFLEX) 500 MG capsule Take 1 capsule (500 mg total) by mouth every 12 (twelve) hours.  . [DISCONTINUED] magnesium oxide (MAG-OX) 400 (241.3 MG) MG tablet Take 1 tablet (400 mg total) by mouth daily.  . [DISCONTINUED] naproxen sodium (ANAPROX) 220 MG tablet Take 220 mg by mouth 2 (two) times daily as needed (pain).  . [DISCONTINUED] saccharomyces boulardii (FLORASTOR) 250 MG capsule Take 1 capsule (250 mg total) by mouth 2 (two) times daily.   No facility-administered encounter medications on file as of 07/02/2015.    Allergies as of 07/02/2015 - Review Complete 07/02/2015  Allergen Reaction Noted  . Penicillins Shortness Of Breath 02/02/2015  . Eggs or  egg-derived products  07/02/2015  . Soy allergy  07/02/2015  . Tylenol with codeine #3 [acetaminophen-codeine]  02/02/2015    Past Medical History  Diagnosis Date  . Asthma   . Hypothyroidism   . Hypertension   . Cancer ([place])     Ocular melanoma  . Diverticulosis   . Diverticulitis   . Blood clotting disorder ([place])   . Status post dilation of esophageal narrowing   . Gallstones   . Kidney stones   . Inguinal hernia     left  . Blind right eye     Past Surgical History  Procedure Laterality Date  . Eye surgery  1994    radioactive plaque  . Cholecystectomy  1996       . Bunionectomy Right   . Bladder surgery  2008  . Abdominal hysterectomy  1966  . Laparoscopy N/A 02/03/2015    Procedure: DIAGNOSTIC LAPAROSCOPY/OPEN SIGMOID COLECTOMY WITH TAKE DOWN OF SPLENIC FLEXURE AND END COLOSTOMY;  Surgeon: Stark Klein, MD;  Location: [facility];  Service: General;  Laterality: N/A;  . Breast biopsy Bilateral   . Throat biopsy    . Trigger finger release      Family History  Problem Relation Age of Onset  . Hypertension Mother   . Alzheimer's disease Mother   . Stroke Father   . Stroke Daughter   . Diabetes Son     Social History   Social History  . Marital Status: Widowed    Spouse Name: N/A  . Number of Children: 4  .  Years of Education: N/A   Occupational History  . retired Pharmacist, hospital    Social History Main Topics  . Smoking status: Never Smoker   . Smokeless tobacco: Never Used  . Alcohol Use: No  . Drug Use: No  . Sexual Activity: Not on file   Other Topics Concern  . Not on file   Social History Narrative      Review of systems: Review of Systems  Constitutional: Negative for fever and chills.  HENT: Negative.   Eyes: Negative for blurred vision.  Respiratory: Negative for cough, shortness of breath and wheezing.   Cardiovascular: Negative for chest pain and palpitations.  Gastrointestinal: as per HPI Genitourinary: Negative for dysuria, urgency,  frequency and hematuria.  Musculoskeletal: Negative for myalgias, back pain and joint pain.  Skin: Negative for itching and rash.  Neurological: Negative for dizziness, tremors, focal weakness, seizures and loss of consciousness.  Endo/Heme/Allergies: Negative for environmental allergies.  Psychiatric/Behavioral: Negative for depression, suicidal ideas and hallucinations.  All other systems reviewed and are negative.   Physical Exam: Filed Vitals:   07/02/15 1358  BP: 124/70  Pulse: 68   Gen:      No acute distress HEENT:  EOMI, sclera anicteric Neck:     No masses; no thyromegaly Lungs:    Clear to auscultation bilaterally; normal respiratory effort CV:         Regular rate and rhythm; no murmurs Abd:      + bowel sounds; soft, non-tender; no palpable masses, no distension, colostomy with brown stool Ext:    No edema; adequate peripheral perfusion Skin:      Warm and dry; no rash Neuro: alert and oriented x 3 Psych: normal mood and affect  Data Reviewed: Reviewed data in epic   Assessment and Plan/Recommendations: 32 yr F s/p perforated diverticulitis, underwent laparoscopy which was converted to an open sigmoid colectomy and end colostomy. Patient is planning to undergo reversal of colostomy Will schedule for colonoscopy prior to reversal of colostomy Will need to hold Eliquis 2 days prior to procedure (according patient and her son, she is supposed to be completing the anticoagulation period in early May)  K. Denzil Magnuson , MD [phone] Mon-Fri 8a-5p [phone] after 5p, weekends, holidays

## 2015-08-13 ENCOUNTER — Ambulatory Visit (AMBULATORY_SURGERY_CENTER): Payer: Medicare Other | Admitting: Gastroenterology

## 2015-08-13 ENCOUNTER — Other Ambulatory Visit: Payer: Self-pay | Admitting: General Surgery

## 2015-08-13 ENCOUNTER — Encounter: Payer: Self-pay | Admitting: Gastroenterology

## 2015-08-13 ENCOUNTER — Encounter: Payer: Medicare Other | Admitting: Gastroenterology

## 2015-08-13 VITALS — BP 137/41 | HR 54 | Temp 97.5°F | Resp 16 | Ht 59.0 in | Wt 134.0 lb

## 2015-08-13 DIAGNOSIS — D123 Benign neoplasm of transverse colon: Secondary | ICD-10-CM

## 2015-08-13 DIAGNOSIS — Z933 Colostomy status: Secondary | ICD-10-CM | POA: Diagnosis not present

## 2015-08-13 MED ORDER — SODIUM CHLORIDE 0.9 % IV SOLN: 500.0000 mL | INTRAVENOUS | Status: DC

## 2015-08-13 MED ORDER — GENTAMICIN SULFATE 40 MG/ML IJ SOLN
5.0000 mg/kg | INTRAMUSCULAR | Status: AC
  Administered 2015-09-23: 300 mg via INTRAVENOUS

## 2015-08-13 MED ORDER — CLINDAMYCIN PHOSPHATE 600 MG/4ML IJ SOLN: 900.0000 mg | INTRAMUSCULAR | Status: DC

## 2015-08-13 NOTE — Op Note (Signed)
[facility] Patient Name: Pamela Mejia Procedure Date: 08/13/2015 10:41 AM MRN: [id number] Endoscopist: Mauri Pole , MD Age: 82 Referring MD:  Date of Birth: [birth year] Gender: Female Procedure:                Colonoscopy via Stoma with Endoscopy of [facility]                            Pouch Indications:              History of partial colectomy Medicines:                Monitored Anesthesia Care Procedure:                Pre-Anesthesia Assessment:                           - Prior to the procedure, a History and Physical                            was performed, and patient medications and                            allergies were reviewed. The patient's tolerance of                            previous anesthesia was also reviewed. The risks                            and benefits of the procedure and the sedation                            options and risks were discussed with the patient.                            All questions were answered, and informed consent                            was obtained. Prior Anticoagulants: The patient has                            taken no previous anticoagulant or antiplatelet                            agents. ASA Grade Assessment: II - A patient with                            mild systemic disease. After reviewing the risks                            and benefits, the patient was deemed in                            satisfactory condition to undergo the procedure.  After obtaining informed consent, the endoscope was                            passed under direct vision. Throughout the                            procedure, the patient's blood pressure, pulse, and                            oxygen saturations were monitored continuously. The                            Model PCF-H190L [id number]) scope was introduced                            through the transverse colostomy and advanced to                             the the terminal ileum, with identification of the                            appendiceal orifice and IC valve. After obtaining                            informed consent, the endoscope was passed under                            direct vision. Throughout the procedure, the                            patient's blood pressure, pulse, and oxygen                            saturations were monitored continuously.The                            procedure was performed without difficulty. The                            patient tolerated the procedure well. The quality                            of the bowel preparation was good. Scope In: 11:03:15 AM Scope Out: 11:22:11 AM Scope Withdrawal Time: 0 hours 17 minutes 23 seconds  Total Procedure Duration: 0 hours 18 minutes 56 seconds  Findings:                 Patient is status-post partial colectomy with an                            end colostomy .                           A 3 mm polyp was found in the transverse colon. The  polyp was sessile. The polyp was removed with a                            cold biopsy forceps. Resection and retrieval were                            complete.                           Multiple small and large-mouthed diverticula were                            found in the transverse colon and ascending colon.                           The scope was inserted through anus and advnaced to                            the 30cm site of end anastomosis                           The perianal and digital rectal examinations were                            normal.                           Multiple small and large-mouthed diverticula were                            found in the sigmoid colon. Noted mucosal                            friability and mild erythema likely secondary to                            diversion colitis. Complications:            No immediate  complications. Estimated Blood Loss:     Estimated blood loss was minimal. Impression:               - One 3 mm polyp in the transverse colon, removed                            with a cold biopsy forceps. Resected and retrieved.                           - Diverticulosis in the transverse colon and in the                            ascending colon.                           - Diverticulosis in the sigmoid colon.  s/p sigmoid resection with end transverse colostomy. Recommendation:           - Patient has a contact number available for                            emergencies. The signs and symptoms of potential                            delayed complications were discussed with the                            patient. Return to normal activities tomorrow.                            Written discharge instructions were provided to the                            patient.                           - Resume previous diet.                           - Await pathology results.                           - Continue present medications. Mauri Pole, MD 08/13/2015 11:35:55 AM This report has been signed electronically.

## 2015-08-13 NOTE — Progress Notes (Signed)
Dr. Silverio Decamp in to examine pt. Abdomen post procedure.  Area left mid abdomen distended, soft.  Pt. Advised by Dr. Silverio Decamp to leave Colostomy bag off for a while to let air pass uncovered stoma.  Pt. And son verbalized understanding.

## 2015-08-13 NOTE — Patient Instructions (Signed)
YOU HAD AN ENDOSCOPIC PROCEDURE TODAY AT THE [facility]:   Refer to the procedure report that was given to you for any specific questions about what was found during the examination.  If the procedure report does not answer your questions, please call your gastroenterologist to clarify.  If you requested that your care partner not be given the details of your procedure findings, then the procedure report has been included in a sealed envelope for you to review at your convenience later.  YOU SHOULD EXPECT: Some feelings of bloating in the abdomen. Passage of more gas than usual.  Walking can help get rid of the air that was put into your GI tract during the procedure and reduce the bloating. If you had a lower endoscopy (such as a colonoscopy or flexible sigmoidoscopy) you may notice spotting of blood in your stool or on the toilet paper. If you underwent a bowel prep for your procedure, you may not have a normal bowel movement for a few days.  Please Note:  You might notice some irritation and congestion in your nose or some drainage.  This is from the oxygen used during your procedure.  There is no need for concern and it should clear up in a day or so.  SYMPTOMS TO REPORT IMMEDIATELY:   Following lower endoscopy (colonoscopy or flexible sigmoidoscopy):  Excessive amounts of blood in the stool  Significant tenderness or worsening of abdominal pains  Swelling of the abdomen that is new, acute  Fever of 100F or higher   For urgent or emergent issues, a gastroenterologist can be reached at any hour by calling [phone].   DIET: Your first meal following the procedure should be a small meal and then it is ok to progress to your normal diet. Heavy or fried foods are harder to digest and may make you feel nauseous or bloated.  Likewise, meals heavy in dairy and vegetables can increase bloating.  Drink plenty of fluids but you should avoid alcoholic beverages for 24  hours.  ACTIVITY:  You should plan to take it easy for the rest of today and you should NOT DRIVE or use heavy machinery until tomorrow (because of the sedation medicines used during the test).    FOLLOW UP: Our staff will call the number listed on your records the next business day following your procedure to check on you and address any questions or concerns that you may have regarding the information given to you following your procedure. If we do not reach you, we will leave a message.  However, if you are feeling well and you are not experiencing any problems, there is no need to return our call.  We will assume that you have returned to your regular daily activities without incident.  If any biopsies were taken you will be contacted by phone or by letter within the next 1-3 weeks.  Please call us at [phone] if you have not heard about the biopsies in 3 weeks.    SIGNATURES/CONFIDENTIALITY: You and/or your care partner have signed paperwork which will be entered into your electronic medical record.  These signatures attest to the fact that that the information above on your After Visit Summary has been reviewed and is understood.  Full responsibility of the confidentiality of this discharge information lies with you and/or your care-partner.  Polyp and diverticulosis information given. 

## 2015-08-13 NOTE — Progress Notes (Signed)
A and Ox 3 Report ot RN

## 2015-08-13 NOTE — Progress Notes (Signed)
Called to room to assist during endoscopic procedure.  Patient ID and intended procedure confirmed with present staff. Received instructions for my participation in the procedure from the performing physician.  

## 2015-08-16 ENCOUNTER — Telehealth: Payer: Self-pay | Admitting: *Deleted

## 2015-08-16 NOTE — Telephone Encounter (Signed)
  Follow up Call-  Call back number 08/13/2015  Post procedure Call Back phone  # [phone]  Permission to leave phone message Yes     Patient questions:  Do you have a fever, pain , or abdominal swelling? No. Pain Score  0 *  Have you tolerated food without any problems? Yes.    Have you been able to return to your normal activities? Yes.    Do you have any questions about your discharge instructions: Diet   No. Medications  No. Follow up visit  No.  Do you have questions or concerns about your Care? No.  Actions: * If pain score is 4 or above: No action needed, pain <4.

## 2015-08-18 ENCOUNTER — Encounter: Payer: Self-pay | Admitting: Gastroenterology

## 2015-09-16 NOTE — Patient Instructions (Addendum)
Pamela Mejia  09/16/2015   Your procedure is scheduled on: Thursday 09/23/2015  Report to [facility] Main  Entrance take [place]  elevators to 3rd floor to  [place] at  [place]   AM.  Call this number if you have problems the morning of surgery [phone]   Remember: ONLY 1 PERSON MAY GO WITH YOU TO SHORT STAY TO GET  READY MORNING OF [place].   Do not eat food or drink liquids :After Midnight.              Follow bowel prep instructions per Dr. Barry Dienes the day before surgery along with clear liquids!     Take these medicines the morning of surgery with A SIP OF WATER: Levothyroxine, Amlodipine, Advair inhaler                                 You may not have any metal on your body including hair pins and              piercings  Do not wear jewelry, make-up, lotions, powders or perfumes, deodorant             Do not wear nail polish.  Do not shave  48 hours prior to surgery.             Do not bring valuables to the hospital. [place].  Contacts, dentures or bridgework may not be worn into surgery.  Leave suitcase in the car. After surgery it may be brought to your room.              Special instructions:  Coughing and deep breathing exercises, leg exercises                  Please read over the following fact sheets you were given: _____________________________________________________________________             [facility] - Preparing for Surgery Before surgery, you can play an important role.  Because skin is not sterile, your skin needs to be as free of germs as possible.  You can reduce the number of germs on your skin by washing with CHG (chlorahexidine gluconate) soap before surgery.  CHG is an antiseptic cleaner which kills germs and bonds with the skin to continue killing germs even after washing. Please DO NOT use if you have an allergy to CHG or antibacterial soaps.  If your skin  becomes reddened/irritated stop using the CHG and inform your nurse when you arrive at Short Stay. Do not shave (including legs and underarms) for at least 48 hours prior to the first CHG shower.  You may shave your face/neck. Please follow these instructions carefully:  1.  Shower with CHG Soap the night before surgery and the  morning of Surgery.  2.  If you choose to wash your hair, wash your hair first as usual with your  normal  shampoo.  3.  After you shampoo, rinse your hair and body thoroughly to remove the  shampoo.                           4.  Use CHG as you would any other  liquid soap.  You can apply chg directly  to the skin and wash                       Gently with a scrungie or clean washcloth.  5.  Apply the CHG Soap to your body ONLY FROM THE NECK DOWN.   Do not use on face/ open                           Wound or open sores. Avoid contact with eyes, ears mouth and genitals (private parts).                       Wash face,  Genitals (private parts) with your normal soap.             6.  Wash thoroughly, paying special attention to the area where your surgery  will be performed.  7.  Thoroughly rinse your body with warm water from the neck down.  8.  DO NOT shower/wash with your normal soap after using and rinsing off  the CHG Soap.                9.  Pat yourself dry with a clean towel.            10.  Wear clean pajamas.            11.  Place clean sheets on your bed the night of your first shower and do not  sleep with pets. Day of Surgery : Do not apply any lotions/deodorants the morning of surgery.  Please wear clean clothes to the hospital/surgery center.  FAILURE TO FOLLOW THESE INSTRUCTIONS MAY RESULT IN THE CANCELLATION OF YOUR SURGERY PATIENT SIGNATURE_________________________________  NURSE SIGNATURE__________________________________  ________________________________________________________________________    CLEAR LIQUID DIET   Foods Allowed                                                                      Foods Excluded  Coffee and tea, regular and decaf                             liquids that you cannot  Plain Jell-O in any flavor                                             see through such as: Fruit ices (not with fruit pulp)                                     milk, soups, orange juice  Iced Popsicles                                    All solid food Carbonated beverages, regular and diet  Cranberry, grape and apple juices Sports drinks like Gatorade Lightly seasoned clear broth or consume(fat free) Sugar, honey syrup  Sample Menu Breakfast                                Lunch                                     Supper Cranberry juice                    Beef broth                            Chicken broth Jell-O                                     Grape juice                           Apple juice Coffee or tea                        Jell-O                                      Popsicle                                                Coffee or tea                        Coffee or tea  _____________________________________________________________________    Incentive Spirometer  An incentive spirometer is a tool that can help keep your lungs clear and active. This tool measures how well you are filling your lungs with each breath. Taking long deep breaths may help reverse or decrease the chance of developing breathing (pulmonary) problems (especially infection) following:  A long period of time when you are unable to move or be active. BEFORE THE PROCEDURE   If the spirometer includes an indicator to show your best effort, your nurse or respiratory therapist will set it to a desired goal.  If possible, sit up straight or lean slightly forward. Try not to slouch.  Hold the incentive spirometer in an upright position. INSTRUCTIONS FOR USE   Sit on the edge of your bed if possible, or sit up as far  as you can in bed or on a chair.  Hold the incentive spirometer in an upright position.  Breathe out normally.  Place the mouthpiece in your mouth and seal your lips tightly around it.  Breathe in slowly and as deeply as possible, raising the piston or the ball toward the top of the column.  Hold your breath for 3-5 seconds or for as long as possible. Allow the piston or ball to fall to the bottom of the column.  Remove the mouthpiece from your mouth and breathe out normally.  Rest for a few seconds and repeat Steps 1 through 7 at  least 10 times every 1-2 hours when you are awake. Take your time and take a few normal breaths between deep breaths.  The spirometer may include an indicator to show your best effort. Use the indicator as a goal to work toward during each repetition.  After each set of 10 deep breaths, practice coughing to be sure your lungs are clear. If you have an incision (the cut made at the time of surgery), support your incision when coughing by placing a pillow or rolled up towels firmly against it. Once you are able to get out of bed, walk around indoors and cough well. You may stop using the incentive spirometer when instructed by your caregiver.  RISKS AND COMPLICATIONS  Take your time so you do not get dizzy or light-headed.  If you are in pain, you may need to take or ask for pain medication before doing incentive spirometry. It is harder to take a deep breath if you are having pain. AFTER USE  Rest and breathe slowly and easily.  It can be helpful to keep track of a log of your progress. Your caregiver can provide you with a simple table to help with this. If you are using the spirometer at home, follow these instructions: [place] IF:   You are having difficultly using the spirometer.  You have trouble using the spirometer as often as instructed.  Your pain medication is not giving enough relief while using the spirometer.  You develop fever  of 100.5 F (38.1 C) or higher. SEEK IMMEDIATE MEDICAL CARE IF:   You cough up bloody sputum that had not been present before.  You develop fever of 102 F (38.9 C) or greater.  You develop worsening pain at or near the incision site. MAKE SURE YOU:   Understand these instructions.  Will watch your condition.  Will get help right away if you are not doing well or get worse. Document Released: 07/24/2006 Document Revised: 06/05/2011 Document Reviewed: 09/24/2006 Exit[employer] Patient Information 2014 Exit[employer], [place].   ________________________________________________________________________  WHAT IS A BLOOD TRANSFUSION? Blood Transfusion Information  A transfusion is the replacement of blood or some of its parts. Blood is made up of multiple cells which provide different functions.  Red blood cells carry oxygen and are used for blood loss replacement.  White blood cells fight against infection.  Platelets control bleeding.  Plasma helps clot blood.  Other blood products are available for specialized needs, such as hemophilia or other clotting disorders. BEFORE THE TRANSFUSION  Who gives blood for transfusions?   Healthy volunteers who are fully evaluated to make sure their blood is safe. This is blood bank blood. Transfusion therapy is the safest it has ever been in the practice of medicine. Before blood is taken from a donor, a complete history is taken to make sure that person has no history of diseases nor engages in risky social behavior (examples are intravenous drug use or sexual activity with multiple partners). The donor's travel history is screened to minimize risk of transmitting infections, such as malaria. The donated blood is tested for signs of infectious diseases, such as HIV and hepatitis. The blood is then tested to be sure it is compatible with you in order to minimize the chance of a transfusion reaction. If you or a relative donates blood, this is often done in  anticipation of surgery and is not appropriate for emergency situations. It takes many days to process the donated blood. RISKS AND COMPLICATIONS Although transfusion  therapy is very safe and saves many lives, the main dangers of transfusion include:   Getting an infectious disease.  Developing a transfusion reaction. This is an allergic reaction to something in the blood you were given. Every precaution is taken to prevent this. The decision to have a blood transfusion has been considered carefully by your caregiver before blood is given. Blood is not given unless the benefits outweigh the risks. AFTER THE TRANSFUSION  Right after receiving a blood transfusion, you will usually feel much better and more energetic. This is especially true if your red blood cells have gotten low (anemic). The transfusion raises the level of the red blood cells which carry oxygen, and this usually causes an energy increase.  The nurse administering the transfusion will monitor you carefully for complications. HOME CARE INSTRUCTIONS  No special instructions are needed after a transfusion. You may find your energy is better. Speak with your caregiver about any limitations on activity for underlying diseases you may have. SEEK MEDICAL CARE IF:   Your condition is not improving after your transfusion.  You develop redness or irritation at the intravenous (IV) site. SEEK IMMEDIATE MEDICAL CARE IF:  Any of the following symptoms occur over the next 12 hours:  Shaking chills.  You have a temperature by mouth above 102 F (38.9 C), not controlled by medicine.  Chest, back, or muscle pain.  People around you feel you are not acting correctly or are confused.  Shortness of breath or difficulty breathing.  Dizziness and fainting.  You get a rash or develop hives.  You have a decrease in urine output.  Your urine turns a dark color or changes to pink, red, or brown. Any of the following symptoms occur over  the next 10 days:  You have a temperature by mouth above 102 F (38.9 C), not controlled by medicine.  Shortness of breath.  Weakness after normal activity.  The white part of the eye turns yellow (jaundice).  You have a decrease in the amount of urine or are urinating less often.  Your urine turns a dark color or changes to pink, red, or brown. Document Released: 03/10/2000 Document Revised: 06/05/2011 Document Reviewed: 10/28/2007 [facility] Patient Information 2014 [place], [place].  _______________________________________________________________________

## 2015-09-17 ENCOUNTER — Encounter (HOSPITAL_COMMUNITY): Payer: Self-pay

## 2015-09-17 ENCOUNTER — Encounter (HOSPITAL_COMMUNITY)
Admission: RE | Admit: 2015-09-17 | Discharge: 2015-09-17 | Disposition: A | Discharge status: Home / Self Care | Payer: Medicare Other | Source: Ambulatory Visit | Attending: General Surgery | Admitting: General Surgery

## 2015-09-17 DIAGNOSIS — K435 Parastomal hernia without obstruction or  gangrene: Secondary | ICD-10-CM | POA: Insufficient documentation

## 2015-09-17 DIAGNOSIS — Z0183 Encounter for blood typing: Secondary | ICD-10-CM | POA: Diagnosis not present

## 2015-09-17 DIAGNOSIS — Z01812 Encounter for preprocedural laboratory examination: Secondary | ICD-10-CM | POA: Insufficient documentation

## 2015-09-17 DIAGNOSIS — Z8719 Personal history of other diseases of the digestive system: Secondary | ICD-10-CM | POA: Insufficient documentation

## 2015-09-17 HISTORY — DX: Gastro-esophageal reflux disease without esophagitis: K21.9

## 2015-09-17 HISTORY — DX: Unspecified osteoarthritis, unspecified site: M19.90

## 2015-09-17 HISTORY — DX: Anemia, unspecified: D64.9

## 2015-09-17 LAB — CBC WITH DIFFERENTIAL/PLATELET
Basophils Absolute: 0 10*3/uL (ref 0.0–0.1)
Basophils Relative: 0 %
EOS PCT: 4 %
Eosinophils Absolute: 0.3 10*3/uL (ref 0.0–0.7)
HCT: 38.2 % (ref 36.0–46.0)
Hemoglobin: 12.5 g/dL (ref 12.0–15.0)
LYMPHS PCT: 39 %
Lymphs Abs: 2.8 10*3/uL (ref 0.7–4.0)
MCH: 30.4 pg (ref 26.0–34.0)
MCHC: 32.7 g/dL (ref 30.0–36.0)
MCV: 92.9 fL (ref 78.0–100.0)
MONO ABS: 0.7 10*3/uL (ref 0.1–1.0)
Monocytes Relative: 10 %
Neutro Abs: 3.5 10*3/uL (ref 1.7–7.7)
Neutrophils Relative %: 47 %
PLATELETS: 217 10*3/uL (ref 150–400)
RBC: 4.11 MIL/uL (ref 3.87–5.11)
RDW: 13.9 % (ref 11.5–15.5)
WBC: 7.3 10*3/uL (ref 4.0–10.5)

## 2015-09-17 LAB — COMPREHENSIVE METABOLIC PANEL
ALK PHOS: 112 U/L (ref 38–126)
ALT: 24 U/L (ref 14–54)
AST: 25 U/L (ref 15–41)
Albumin: 4.1 g/dL (ref 3.5–5.0)
Anion gap: 7 (ref 5–15)
BUN: 33 mg/dL — ABNORMAL HIGH (ref 6–20)
CALCIUM: 9.7 mg/dL (ref 8.9–10.3)
CHLORIDE: 109 mmol/L (ref 101–111)
CO2: 25 mmol/L (ref 22–32)
CREATININE: 1.05 mg/dL — AB (ref 0.44–1.00)
GFR, EST AFRICAN AMERICAN: 54 mL/min — AB (ref >60)
GFR, EST NON AFRICAN AMERICAN: 47 mL/min — AB (ref >60)
Glucose, Bld: 116 mg/dL — ABNORMAL HIGH (ref 65–99)
Potassium: 3.7 mmol/L (ref 3.5–5.1)
Sodium: 141 mmol/L (ref 135–145)
TOTAL PROTEIN: 7.2 g/dL (ref 6.5–8.1)
Total Bilirubin: 0.6 mg/dL (ref 0.3–1.2)

## 2015-09-17 LAB — URINALYSIS, ROUTINE W REFLEX MICROSCOPIC
Bilirubin Urine: NEGATIVE
GLUCOSE, UA: NEGATIVE mg/dL
HGB URINE DIPSTICK: NEGATIVE
KETONES UR: NEGATIVE mg/dL
Nitrite: NEGATIVE
PH: 5 (ref 5.0–8.0)
PROTEIN: NEGATIVE mg/dL
SPECIFIC GRAVITY, URINE: 1.021 (ref 1.005–1.030)

## 2015-09-17 LAB — URINE MICROSCOPIC-ADD ON

## 2015-09-17 NOTE — [place] Notes (Signed)
09-17-15 Martin Majestic over surgery instructions with son.  He stated that he will get the bowl prep kit for his mother.  Emphasized the importance of staying hydrated with clear liquids while taking the bowl prep.

## 2015-09-17 NOTE — [place] Notes (Addendum)
02-08-15 - CXR - EPIC 02-02-15 - EKG - EPIC 03-10-15 - LOV - Dr. Vaughan Browner (pulmon) - EPIC  07-02-15 =- LOV - Dr. Silverio Decamp (gastro) - EPIC 04-26-15 - CT ABD/Pelvis - EPIC

## 2015-09-17 NOTE — [place] Notes (Signed)
09-17-15 - CMP, UA/Microscopic lab results from preop visit faxed to Dr. Barry Dienes via [facility]

## 2015-09-18 LAB — HEMOGLOBIN A1C
HEMOGLOBIN A1C: 5.4 % (ref 4.8–5.6)
Mean Plasma Glucose: 108 mg/dL

## 2015-09-22 ENCOUNTER — Encounter (HOSPITAL_COMMUNITY): Payer: Self-pay | Admitting: *Deleted

## 2015-09-22 MED ORDER — ALVIMOPAN 12 MG PO CAPS: 12.0000 mg | ORAL_CAPSULE | ORAL | Status: AC

## 2015-09-22 NOTE — [place] Notes (Signed)
Spoke with pt's son, Shyna Gillogly for pre-op call. Pt had a PAT appt at [facility] last week, but surgery has been move to [facility]. Mr. Reum verified that allergies, medications, medical and surgical history was correct. Gave him pre-op instructions.

## 2015-09-22 NOTE — Anesthesia Preprocedure Evaluation (Addendum)
Anesthesia Evaluation  Patient identified by MRN, date of birth, ID band Patient awake    Reviewed: Allergy & Precautions, H&P , NPO status , Patient's Chart, lab work & pertinent test results  Airway Mallampati: II  TM Distance: >3 FB Neck ROM: Full    Dental no notable dental hx. (+) Upper Dentures, Lower Dentures, Dental Advisory Given   Pulmonary asthma ,    Pulmonary exam normal breath sounds clear to auscultation       Cardiovascular hypertension, Pt. on medications  Rhythm:Regular Rate:Normal     Neuro/Psych negative neurological ROS  negative psych ROS   GI/Hepatic Neg liver ROS, GERD  Controlled,  Endo/Other  Hypothyroidism   Renal/GU Renal disease  negative genitourinary   Musculoskeletal  (+) Arthritis , Osteoarthritis,    Abdominal   Peds  Hematology negative hematology ROS (+)   Anesthesia Other Findings   Reproductive/Obstetrics negative OB ROS                            Anesthesia Physical Anesthesia Plan  ASA: II  Anesthesia Plan: General   Post-op Pain Management:    Induction: Intravenous, Rapid sequence and Cricoid pressure planned  Airway Management Planned: Oral ETT  Additional Equipment:   Intra-op Plan:   Post-operative Plan: Extubation in OR  Informed Consent: I have reviewed the patients History and Physical, chart, labs and discussed the procedure including the risks, benefits and alternatives for the proposed anesthesia with the patient or authorized representative who has indicated his/her understanding and acceptance.   Dental advisory given  Plan Discussed with: CRNA  Anesthesia Plan Comments:        Anesthesia Quick Evaluation

## 2015-09-23 ENCOUNTER — Emergency Department (HOSPITAL_COMMUNITY): Payer: Medicare Other | Admitting: Anesthesiology

## 2015-09-23 ENCOUNTER — Encounter (HOSPITAL_COMMUNITY): Payer: Self-pay

## 2015-09-23 ENCOUNTER — Emergency Department (HOSPITAL_COMMUNITY): Payer: Medicare Other

## 2015-09-23 ENCOUNTER — Encounter (HOSPITAL_COMMUNITY)
Admission: EM | Disposition: A | Discharge status: Skilled Nursing Facility | Payer: Self-pay | Source: Home / Self Care | Attending: General Surgery

## 2015-09-23 ENCOUNTER — Inpatient Hospital Stay (HOSPITAL_COMMUNITY): Admission: RE | Admit: 2015-09-23 | Payer: Medicare Other | Source: Ambulatory Visit | Admitting: General Surgery

## 2015-09-23 ENCOUNTER — Inpatient Hospital Stay (HOSPITAL_COMMUNITY)
Admission: EM | Admit: 2015-09-23 | Discharge: 2015-09-28 | DRG: 331 | Disposition: A | Discharge status: Skilled Nursing Facility | Payer: Medicare Other | Attending: General Surgery | Admitting: General Surgery

## 2015-09-23 DIAGNOSIS — R112 Nausea with vomiting, unspecified: Secondary | ICD-10-CM

## 2015-09-23 DIAGNOSIS — I10 Essential (primary) hypertension: Secondary | ICD-10-CM | POA: Diagnosis present

## 2015-09-23 DIAGNOSIS — Z7951 Long term (current) use of inhaled steroids: Secondary | ICD-10-CM

## 2015-09-23 DIAGNOSIS — Z880 Allergy status to penicillin: Secondary | ICD-10-CM | POA: Diagnosis not present

## 2015-09-23 DIAGNOSIS — K660 Peritoneal adhesions (postprocedural) (postinfection): Secondary | ICD-10-CM | POA: Diagnosis present

## 2015-09-23 DIAGNOSIS — K435 Parastomal hernia without obstruction or  gangrene: Secondary | ICD-10-CM | POA: Diagnosis present

## 2015-09-23 DIAGNOSIS — E039 Hypothyroidism, unspecified: Secondary | ICD-10-CM | POA: Diagnosis present

## 2015-09-23 DIAGNOSIS — J45909 Unspecified asthma, uncomplicated: Secondary | ICD-10-CM | POA: Diagnosis present

## 2015-09-23 DIAGNOSIS — Z90710 Acquired absence of both cervix and uterus: Secondary | ICD-10-CM

## 2015-09-23 DIAGNOSIS — Z8719 Personal history of other diseases of the digestive system: Secondary | ICD-10-CM

## 2015-09-23 DIAGNOSIS — Z886 Allergy status to analgesic agent status: Secondary | ICD-10-CM

## 2015-09-23 DIAGNOSIS — Z433 Encounter for attention to colostomy: Secondary | ICD-10-CM | POA: Diagnosis not present

## 2015-09-23 DIAGNOSIS — Z79899 Other long term (current) drug therapy: Secondary | ICD-10-CM | POA: Diagnosis not present

## 2015-09-23 HISTORY — PX: HERNIA REPAIR: SHX51

## 2015-09-23 HISTORY — PX: PARASTOMAL HERNIA REPAIR: SHX2162

## 2015-09-23 HISTORY — PX: COLOSTOMY TAKEDOWN: SHX5783

## 2015-09-23 HISTORY — DX: Adverse effect of unspecified anesthetic, initial encounter: T41.45XA

## 2015-09-23 HISTORY — DX: Other complications of anesthesia, initial encounter: T88.59XA

## 2015-09-23 LAB — CBC WITH DIFFERENTIAL/PLATELET
BASOS PCT: 0 %
Basophils Absolute: 0 10*3/uL (ref 0.0–0.1)
EOS PCT: 2 %
Eosinophils Absolute: 0.1 10*3/uL (ref 0.0–0.7)
HEMATOCRIT: 36.4 % (ref 36.0–46.0)
Hemoglobin: 12 g/dL (ref 12.0–15.0)
Lymphocytes Relative: 23 %
Lymphs Abs: 1.5 10*3/uL (ref 0.7–4.0)
MCH: 30.1 pg (ref 26.0–34.0)
MCHC: 33 g/dL (ref 30.0–36.0)
MCV: 91.2 fL (ref 78.0–100.0)
MONO ABS: 0.5 10*3/uL (ref 0.1–1.0)
MONOS PCT: 8 %
NEUTROS ABS: 4.5 10*3/uL (ref 1.7–7.7)
Neutrophils Relative %: 67 %
Platelets: 215 10*3/uL (ref 150–400)
RBC: 3.99 MIL/uL (ref 3.87–5.11)
RDW: 13 % (ref 11.5–15.5)
WBC: 6.7 10*3/uL (ref 4.0–10.5)

## 2015-09-23 LAB — CBC
HEMATOCRIT: 36.1 % (ref 36.0–46.0)
HEMOGLOBIN: 11.9 g/dL — AB (ref 12.0–15.0)
MCH: 30.5 pg (ref 26.0–34.0)
MCHC: 33 g/dL (ref 30.0–36.0)
MCV: 92.6 fL (ref 78.0–100.0)
Platelets: 195 10*3/uL (ref 150–400)
RBC: 3.9 MIL/uL (ref 3.87–5.11)
RDW: 13.3 % (ref 11.5–15.5)
WBC: 14 10*3/uL — ABNORMAL HIGH (ref 4.0–10.5)

## 2015-09-23 LAB — COMPREHENSIVE METABOLIC PANEL
ALK PHOS: 90 U/L (ref 38–126)
ALT: 22 U/L (ref 14–54)
ANION GAP: 7 (ref 5–15)
AST: 24 U/L (ref 15–41)
Albumin: 3.5 g/dL (ref 3.5–5.0)
BILIRUBIN TOTAL: 1.2 mg/dL (ref 0.3–1.2)
BUN: 17 mg/dL (ref 6–20)
CALCIUM: 9.7 mg/dL (ref 8.9–10.3)
CO2: 23 mmol/L (ref 22–32)
Chloride: 103 mmol/L (ref 101–111)
Creatinine, Ser: 1.06 mg/dL — ABNORMAL HIGH (ref 0.44–1.00)
GFR calc non Af Amer: 46 mL/min — ABNORMAL LOW (ref >60)
GFR, EST AFRICAN AMERICAN: 54 mL/min — AB (ref >60)
Glucose, Bld: 111 mg/dL — ABNORMAL HIGH (ref 65–99)
Potassium: 3.4 mmol/L — ABNORMAL LOW (ref 3.5–5.1)
SODIUM: 133 mmol/L — AB (ref 135–145)
TOTAL PROTEIN: 6.1 g/dL — AB (ref 6.5–8.1)

## 2015-09-23 LAB — CREATININE, SERUM
Creatinine, Ser: 1.04 mg/dL — ABNORMAL HIGH (ref 0.44–1.00)
GFR calc Af Amer: 55 mL/min — ABNORMAL LOW (ref >60)
GFR, EST NON AFRICAN AMERICAN: 47 mL/min — AB (ref >60)

## 2015-09-23 LAB — PROTIME-INR
INR: 1.14 (ref 0.00–1.49)
PROTHROMBIN TIME: 14.8 s (ref 11.6–15.2)

## 2015-09-23 LAB — TYPE AND SCREEN
ABO/RH(D): O POS
ANTIBODY SCREEN: NEGATIVE

## 2015-09-23 LAB — LIPASE, BLOOD: Lipase: 19 U/L (ref 11–51)

## 2015-09-23 SURGERY — CLOSURE, COLOSTOMY
Anesthesia: General | Site: Abdomen

## 2015-09-23 MED ORDER — NEOMYCIN SULFATE 500 MG PO TABS: 1000.0000 mg | ORAL_TABLET | ORAL | Status: DC

## 2015-09-23 MED ORDER — HYDROMORPHONE HCL 1 MG/ML IJ SOLN
INTRAMUSCULAR | Status: AC
  Filled 2015-09-23: qty 1

## 2015-09-23 MED ORDER — AMLODIPINE BESYLATE 5 MG PO TABS
5.0000 mg | ORAL_TABLET | Freq: Every day | ORAL | Status: DC
  Administered 2015-09-23 – 2015-09-28 (×6): 5 mg via ORAL
  Filled 2015-09-23 (×6): qty 1

## 2015-09-23 MED ORDER — FENTANYL 40 MCG/ML IV SOLN
INTRAVENOUS | Status: DC
  Administered 2015-09-23: 30 ug via INTRAVENOUS
  Administered 2015-09-23: 12:00:00 via INTRAVENOUS
  Administered 2015-09-24 (×2): 40 ug via INTRAVENOUS
  Administered 2015-09-24: 30 ug via INTRAVENOUS
  Administered 2015-09-24: 20 ug via INTRAVENOUS
  Administered 2015-09-24: 50 ug via INTRAVENOUS
  Administered 2015-09-25: 20 ug via INTRAVENOUS
  Administered 2015-09-25: 50 ug via INTRAVENOUS
  Administered 2015-09-25 (×2): 20 ug via INTRAVENOUS
  Administered 2015-09-25: 10 ug via INTRAVENOUS
  Administered 2015-09-26: 50 ug via INTRAVENOUS

## 2015-09-23 MED ORDER — HYDROMORPHONE HCL 1 MG/ML IJ SOLN
INTRAMUSCULAR | Status: AC
  Administered 2015-09-23: 0.5 mg via INTRAVENOUS
  Filled 2015-09-23: qty 1

## 2015-09-23 MED ORDER — SUCCINYLCHOLINE CHLORIDE 20 MG/ML IJ SOLN
INTRAMUSCULAR | Status: DC | PRN
  Administered 2015-09-23: 100 mg via INTRAVENOUS

## 2015-09-23 MED ORDER — KCL IN DEXTROSE-NACL 20-5-0.45 MEQ/L-%-% IV SOLN
INTRAVENOUS | Status: AC
  Filled 2015-09-23: qty 1000

## 2015-09-23 MED ORDER — DIPHENHYDRAMINE HCL 12.5 MG/5ML PO ELIX: 12.5000 mg | ORAL_SOLUTION | Freq: Four times a day (QID) | ORAL | Status: DC | PRN

## 2015-09-23 MED ORDER — GENTAMICIN SULFATE 40 MG/ML IJ SOLN
300.0000 mg | INTRAVENOUS | Status: DC
  Filled 2015-09-23: qty 7.5

## 2015-09-23 MED ORDER — IOPAMIDOL (ISOVUE-300) INJECTION 61%
INTRAVENOUS | Status: AC
  Administered 2015-09-23: 100 mL
  Filled 2015-09-23: qty 100

## 2015-09-23 MED ORDER — ONDANSETRON 4 MG PO TBDP: 4.0000 mg | ORAL_TABLET | Freq: Four times a day (QID) | ORAL | Status: DC | PRN

## 2015-09-23 MED ORDER — SIMETHICONE 80 MG PO CHEW: 40.0000 mg | CHEWABLE_TABLET | Freq: Four times a day (QID) | ORAL | Status: DC | PRN

## 2015-09-23 MED ORDER — POLYETHYLENE GLYCOL 3350 17 GM/SCOOP PO POWD: 1.0000 | Freq: Once | ORAL | Status: DC

## 2015-09-23 MED ORDER — ONDANSETRON HCL 4 MG/2ML IJ SOLN: 4.0000 mg | Freq: Four times a day (QID) | INTRAMUSCULAR | Status: DC | PRN

## 2015-09-23 MED ORDER — BUPIVACAINE ON-Q PAIN PUMP (FOR ORDER SET NO CHG)
INJECTION | Status: AC
  Filled 2015-09-23: qty 1

## 2015-09-23 MED ORDER — METHOCARBAMOL 500 MG PO TABS
500.0000 mg | ORAL_TABLET | Freq: Four times a day (QID) | ORAL | Status: DC | PRN
  Administered 2015-09-26: 500 mg via ORAL
  Filled 2015-09-23: qty 1

## 2015-09-23 MED ORDER — EPHEDRINE SULFATE 50 MG/ML IJ SOLN
INTRAMUSCULAR | Status: DC | PRN
  Administered 2015-09-23: 5 mg via INTRAVENOUS
  Administered 2015-09-23: 10 mg via INTRAVENOUS
  Administered 2015-09-23 (×2): 5 mg via INTRAVENOUS
  Administered 2015-09-23: 10 mg via INTRAVENOUS

## 2015-09-23 MED ORDER — PROPOFOL 10 MG/ML IV BOLUS
INTRAVENOUS | Status: AC
  Filled 2015-09-23: qty 20

## 2015-09-23 MED ORDER — LACTATED RINGERS IV SOLN
INTRAVENOUS | Status: DC | PRN
  Administered 2015-09-23 (×2): via INTRAVENOUS

## 2015-09-23 MED ORDER — SODIUM CHLORIDE 0.9% FLUSH: 9.0000 mL | INTRAVENOUS | Status: DC | PRN

## 2015-09-23 MED ORDER — FENTANYL 40 MCG/ML IV SOLN
INTRAVENOUS | Status: AC
  Filled 2015-09-23: qty 25

## 2015-09-23 MED ORDER — CHLORHEXIDINE GLUCONATE CLOTH 2 % EX PADS: 6.0000 | MEDICATED_PAD | Freq: Once | CUTANEOUS | Status: DC

## 2015-09-23 MED ORDER — ALVIMOPAN 12 MG PO CAPS
12.0000 mg | ORAL_CAPSULE | Freq: Two times a day (BID) | ORAL | Status: DC
  Administered 2015-09-24 (×2): 12 mg via ORAL
  Filled 2015-09-23 (×2): qty 1

## 2015-09-23 MED ORDER — NALOXONE HCL 0.4 MG/ML IJ SOLN: 0.4000 mg | INTRAMUSCULAR | Status: DC | PRN

## 2015-09-23 MED ORDER — LEVOTHYROXINE SODIUM 25 MCG PO TABS
25.0000 ug | ORAL_TABLET | Freq: Every day | ORAL | Status: DC
  Administered 2015-09-24 – 2015-09-28 (×5): 25 ug via ORAL
  Filled 2015-09-23 (×5): qty 1

## 2015-09-23 MED ORDER — ALVIMOPAN 12 MG PO CAPS
12.0000 mg | ORAL_CAPSULE | Freq: Once | ORAL | Status: AC
  Administered 2015-09-23: 12 mg via ORAL

## 2015-09-23 MED ORDER — DEXAMETHASONE SODIUM PHOSPHATE 10 MG/ML IJ SOLN
INTRAMUSCULAR | Status: AC
  Filled 2015-09-23: qty 1

## 2015-09-23 MED ORDER — ONDANSETRON HCL 4 MG/2ML IJ SOLN
4.0000 mg | Freq: Once | INTRAMUSCULAR | Status: AC
  Administered 2015-09-23: 4 mg via INTRAVENOUS
  Filled 2015-09-23: qty 2

## 2015-09-23 MED ORDER — ROCURONIUM BROMIDE 100 MG/10ML IV SOLN
INTRAVENOUS | Status: DC | PRN
  Administered 2015-09-23: 30 mg via INTRAVENOUS

## 2015-09-23 MED ORDER — FENTANYL CITRATE (PF) 100 MCG/2ML IJ SOLN: 12.5000 ug | INTRAMUSCULAR | Status: DC | PRN

## 2015-09-23 MED ORDER — ONDANSETRON HCL 4 MG/2ML IJ SOLN
INTRAMUSCULAR | Status: AC
  Filled 2015-09-23: qty 2

## 2015-09-23 MED ORDER — FENTANYL CITRATE (PF) 100 MCG/2ML IJ SOLN
INTRAMUSCULAR | Status: DC | PRN
  Administered 2015-09-23 (×5): 50 ug via INTRAVENOUS

## 2015-09-23 MED ORDER — DEXAMETHASONE SODIUM PHOSPHATE 10 MG/ML IJ SOLN
INTRAMUSCULAR | Status: DC | PRN
  Administered 2015-09-23: 5 mg via INTRAVENOUS

## 2015-09-23 MED ORDER — SODIUM CHLORIDE 0.9 % IV SOLN
1000.0000 mL | INTRAVENOUS | Status: DC
  Administered 2015-09-23: 1000 mL via INTRAVENOUS

## 2015-09-23 MED ORDER — HYDRALAZINE HCL 20 MG/ML IJ SOLN: 10.0000 mg | INTRAMUSCULAR | Status: DC | PRN

## 2015-09-23 MED ORDER — CLINDAMYCIN PHOSPHATE 900 MG/50ML IV SOLN
INTRAVENOUS | Status: AC
  Administered 2015-09-23: 900 mg via INTRAVENOUS
  Filled 2015-09-23: qty 50

## 2015-09-23 MED ORDER — SUGAMMADEX SODIUM 200 MG/2ML IV SOLN
INTRAVENOUS | Status: DC | PRN
Start: 2015-09-23 — End: 2015-09-23
  Administered 2015-09-23: 125 mg via INTRAVENOUS

## 2015-09-23 MED ORDER — DIPHENHYDRAMINE HCL 50 MG/ML IJ SOLN: 12.5000 mg | Freq: Four times a day (QID) | INTRAMUSCULAR | Status: DC | PRN

## 2015-09-23 MED ORDER — KCL IN DEXTROSE-NACL 20-5-0.45 MEQ/L-%-% IV SOLN
INTRAVENOUS | Status: DC
  Administered 2015-09-23 – 2015-09-27 (×5): via INTRAVENOUS
  Filled 2015-09-23 (×7): qty 1000

## 2015-09-23 MED ORDER — BUPIVACAINE 0.25 % ON-Q PUMP SINGLE CATH 300ML
300.0000 mL | INJECTION | Status: DC
  Administered 2015-09-23: 300 mL
  Filled 2015-09-23: qty 300

## 2015-09-23 MED ORDER — FENTANYL CITRATE (PF) 250 MCG/5ML IJ SOLN
INTRAMUSCULAR | Status: AC
  Filled 2015-09-23: qty 5

## 2015-09-23 MED ORDER — HYDROMORPHONE HCL 1 MG/ML IJ SOLN
0.2500 mg | INTRAMUSCULAR | Status: DC | PRN
  Administered 2015-09-23 (×2): 0.5 mg via INTRAVENOUS
  Administered 2015-09-23 (×2): 0.25 mg via INTRAVENOUS
  Administered 2015-09-23: 0.5 mg via INTRAVENOUS

## 2015-09-23 MED ORDER — DIPHENHYDRAMINE HCL 50 MG/ML IJ SOLN
12.5000 mg | Freq: Four times a day (QID) | INTRAMUSCULAR | Status: DC | PRN
Start: 2015-09-23 — End: 2015-09-28

## 2015-09-23 MED ORDER — SUGAMMADEX SODIUM 200 MG/2ML IV SOLN
INTRAVENOUS | Status: AC
  Filled 2015-09-23: qty 2

## 2015-09-23 MED ORDER — PROPOFOL 10 MG/ML IV BOLUS
INTRAVENOUS | Status: DC | PRN
  Administered 2015-09-23: 100 mg via INTRAVENOUS
  Administered 2015-09-23: 30 mg via INTRAVENOUS

## 2015-09-23 MED ORDER — ROCURONIUM BROMIDE 50 MG/5ML IV SOLN
INTRAVENOUS | Status: AC
  Filled 2015-09-23: qty 1

## 2015-09-23 MED ORDER — METRONIDAZOLE 500 MG PO TABS: 1000.0000 mg | ORAL_TABLET | ORAL | Status: DC

## 2015-09-23 MED ORDER — ALVIMOPAN 12 MG PO CAPS
ORAL_CAPSULE | ORAL | Status: AC
  Filled 2015-09-23: qty 1

## 2015-09-23 MED ORDER — SODIUM CHLORIDE 0.9 % IV SOLN
1000.0000 mL | Freq: Once | INTRAVENOUS | Status: AC
  Administered 2015-09-23: 1000 mL via INTRAVENOUS

## 2015-09-23 MED ORDER — HEPARIN SODIUM (PORCINE) 5000 UNIT/ML IJ SOLN
5000.0000 [IU] | Freq: Three times a day (TID) | INTRAMUSCULAR | Status: DC
  Administered 2015-09-23 – 2015-09-28 (×15): 5000 [IU] via SUBCUTANEOUS
  Filled 2015-09-23 (×15): qty 1

## 2015-09-23 MED ORDER — HYDROCHLOROTHIAZIDE 12.5 MG PO CAPS
12.5000 mg | ORAL_CAPSULE | Freq: Every day | ORAL | Status: DC
  Administered 2015-09-23 – 2015-09-28 (×6): 12.5 mg via ORAL
  Filled 2015-09-23 (×6): qty 1

## 2015-09-23 MED ORDER — LIDOCAINE HCL (CARDIAC) 20 MG/ML IV SOLN
INTRAVENOUS | Status: DC | PRN
  Administered 2015-09-23: 60 mg via INTRAVENOUS

## 2015-09-23 MED ORDER — LOSARTAN POTASSIUM 50 MG PO TABS
100.0000 mg | ORAL_TABLET | Freq: Every day | ORAL | Status: DC
  Administered 2015-09-23 – 2015-09-28 (×6): 100 mg via ORAL
  Filled 2015-09-23 (×6): qty 2

## 2015-09-23 MED ORDER — MOMETASONE FURO-FORMOTEROL FUM 100-5 MCG/ACT IN AERO
2.0000 | INHALATION_SPRAY | Freq: Two times a day (BID) | RESPIRATORY_TRACT | Status: DC
  Administered 2015-09-23 – 2015-09-28 (×10): 2 via RESPIRATORY_TRACT
  Filled 2015-09-23 (×2): qty 8.8

## 2015-09-23 MED ORDER — GENTAMICIN IN SALINE 1.6-0.9 MG/ML-% IV SOLN
INTRAVENOUS | Status: AC
  Filled 2015-09-23: qty 50

## 2015-09-23 MED ORDER — LIDOCAINE 2% (20 MG/ML) 5 ML SYRINGE
INTRAMUSCULAR | Status: AC
  Filled 2015-09-23: qty 5

## 2015-09-23 MED ORDER — ONDANSETRON HCL 4 MG/2ML IJ SOLN
INTRAMUSCULAR | Status: DC | PRN
  Administered 2015-09-23 (×2): 4 mg via INTRAVENOUS

## 2015-09-23 MED ORDER — 0.9 % SODIUM CHLORIDE (POUR BTL) OPTIME
TOPICAL | Status: DC | PRN
  Administered 2015-09-23 (×3): 1000 mL

## 2015-09-23 SURGICAL SUPPLY — 73 items
BLADE SURG 11 STRL SS (BLADE) ×3 IMPLANT
BLADE SURG ROTATE 9660 (MISCELLANEOUS) IMPLANT
CANISTER SUCTION 2500CC (MISCELLANEOUS) ×3 IMPLANT
CATH KIT ON-Q SILVERSOAK 5IN (CATHETERS) ×6 IMPLANT
CELLS DAT CNTRL 66122 CELL SVR (MISCELLANEOUS) IMPLANT
CHLORAPREP W/TINT 26ML (MISCELLANEOUS) ×3 IMPLANT
COVER MAYO STAND STRL (DRAPES) ×6 IMPLANT
COVER SURGICAL LIGHT HANDLE (MISCELLANEOUS) ×6 IMPLANT
DRAPE LAPAROSCOPIC ABDOMINAL (DRAPES) ×3 IMPLANT
DRAPE PROXIMA HALF (DRAPES) ×6 IMPLANT
DRAPE UTILITY XL STRL (DRAPES) ×3 IMPLANT
DRAPE WARM FLUID 44X44 (DRAPE) ×3 IMPLANT
DRSG OPSITE POSTOP 3X4 (GAUZE/BANDAGES/DRESSINGS) ×3 IMPLANT
DRSG OPSITE POSTOP 4X10 (GAUZE/BANDAGES/DRESSINGS) IMPLANT
DRSG OPSITE POSTOP 4X8 (GAUZE/BANDAGES/DRESSINGS) ×3 IMPLANT
DRSG TEGADERM 4X4.75 (GAUZE/BANDAGES/DRESSINGS) ×3 IMPLANT
ELECT BLADE 6.5 EXT (BLADE) ×3 IMPLANT
ELECT CAUTERY BLADE 6.4 (BLADE) ×6 IMPLANT
ELECT REM PT RETURN 9FT ADLT (ELECTROSURGICAL) ×3
ELECTRODE REM PT RTRN 9FT ADLT (ELECTROSURGICAL) ×1 IMPLANT
GAUZE SPONGE 4X4 16PLY XRAY LF (GAUZE/BANDAGES/DRESSINGS) ×3 IMPLANT
GLOVE BIO SURGEON STRL SZ 6 (GLOVE) ×6 IMPLANT
GLOVE BIO SURGEON STRL SZ 6.5 (GLOVE) ×2 IMPLANT
GLOVE BIO SURGEONS STRL SZ 6.5 (GLOVE) ×1
GLOVE BIOGEL PI IND STRL 6.5 (GLOVE) ×2 IMPLANT
GLOVE BIOGEL PI IND STRL 7.0 (GLOVE) ×1 IMPLANT
GLOVE BIOGEL PI IND STRL 7.5 (GLOVE) ×1 IMPLANT
GLOVE BIOGEL PI IND STRL 8 (GLOVE) ×2 IMPLANT
GLOVE BIOGEL PI INDICATOR 6.5 (GLOVE) ×4
GLOVE BIOGEL PI INDICATOR 7.0 (GLOVE) ×2
GLOVE BIOGEL PI INDICATOR 7.5 (GLOVE) ×2
GLOVE BIOGEL PI INDICATOR 8 (GLOVE) ×4
GLOVE ECLIPSE 7.5 STRL STRAW (GLOVE) ×3 IMPLANT
GLOVE ECLIPSE 8.0 STRL XLNG CF (GLOVE) ×6 IMPLANT
GLOVE INDICATOR 6.5 STRL GRN (GLOVE) ×6 IMPLANT
GOWN STRL REUS W/ TWL LRG LVL3 (GOWN DISPOSABLE) ×5 IMPLANT
GOWN STRL REUS W/ TWL XL LVL3 (GOWN DISPOSABLE) ×2 IMPLANT
GOWN STRL REUS W/TWL 2XL LVL3 (GOWN DISPOSABLE) ×6 IMPLANT
GOWN STRL REUS W/TWL LRG LVL3 (GOWN DISPOSABLE) ×15
GOWN STRL REUS W/TWL XL LVL3 (GOWN DISPOSABLE) ×6
KIT BASIN OR (CUSTOM PROCEDURE TRAY) ×3 IMPLANT
KIT ROOM TURNOVER OR (KITS) ×3 IMPLANT
LEGGING LITHOTOMY PAIR STRL (DRAPES) ×3 IMPLANT
LIGASURE IMPACT 36 18CM CVD LR (INSTRUMENTS) ×3 IMPLANT
NS IRRIG 1000ML POUR BTL (IV SOLUTION) ×6 IMPLANT
PACK GENERAL/GYN (CUSTOM PROCEDURE TRAY) ×3 IMPLANT
PAD ARMBOARD 7.5X6 YLW CONV (MISCELLANEOUS) ×3 IMPLANT
PENCIL BUTTON HOLSTER BLD 10FT (ELECTRODE) ×3 IMPLANT
RETRACTOR WND ALEXIS 25 LRG (MISCELLANEOUS) IMPLANT
RTRCTR WOUND ALEXIS 18CM MED (MISCELLANEOUS)
RTRCTR WOUND ALEXIS 25CM LRG (MISCELLANEOUS)
SPECIMEN JAR X LARGE (MISCELLANEOUS) ×3 IMPLANT
SPONGE LAP 18X18 X RAY DECT (DISPOSABLE) ×3 IMPLANT
STAPLER CIRC ILS CVD 33MM 37CM (STAPLE) ×3 IMPLANT
STAPLER VISISTAT 35W (STAPLE) ×3 IMPLANT
SUCTION POOLE TIP (SUCTIONS) ×3 IMPLANT
SUT NOVA 1 T20/GS 25DT (SUTURE) ×12 IMPLANT
SUT PDS II 0 TP-1 LOOPED 60 (SUTURE) ×6 IMPLANT
SUT PROLENE 0 SH 30 (SUTURE) ×3 IMPLANT
SUT PROLENE 2 0 CT2 30 (SUTURE) ×3 IMPLANT
SUT VIC AB 2-0 SH 18 (SUTURE) ×3 IMPLANT
SUT VIC AB 3-0 SH 18 (SUTURE) ×3 IMPLANT
SUT VICRYL AB 2 0 TIES (SUTURE) ×3 IMPLANT
SUT VICRYL AB 3 0 TIES (SUTURE) ×3 IMPLANT
SYR BULB IRRIGATION 50ML (SYRINGE) ×3 IMPLANT
TOWEL OR 17X26 10 PK STRL BLUE (TOWEL DISPOSABLE) ×3 IMPLANT
TRAY FOLEY CATH 14FRSI W/METER (CATHETERS) ×3 IMPLANT
TRAY PROCTOSCOPIC FIBER OPTIC (SET/KITS/TRAYS/PACK) ×3 IMPLANT
TUBE CONNECTING 12'X1/4 (SUCTIONS) ×1
TUBE CONNECTING 12X1/4 (SUCTIONS) ×2 IMPLANT
TUNNELER SHEATH ON-Q 16GX12 DP (PAIN MANAGEMENT) ×3 IMPLANT
WATER STERILE IRR 1000ML POUR (IV SOLUTION) IMPLANT
YANKAUER SUCT BULB TIP NO VENT (SUCTIONS) ×6 IMPLANT

## 2015-09-23 NOTE — Anesthesia Postprocedure Evaluation (Signed)
Anesthesia Post Note  Patient: Pamela Mejia  Procedure(s) Performed: Procedure(s) (LRB): COLOSTOMY TAKEDOWN (N/A) HERNIA REPAIR PARASTOMAL (N/A)  Patient location during evaluation: PACU Anesthesia Type: General Level of consciousness: sedated and patient cooperative Pain management: pain level controlled Vital Signs Assessment: post-procedure vital signs reviewed and stable Respiratory status: spontaneous breathing Cardiovascular status: stable Anesthetic complications: no    Last Vitals:  Filed Vitals:   09/23/15 1245 09/23/15 1300  BP: 133/63 130/63  Pulse: 58 69  Temp:  36.4 C  Resp: 9 18    Last Pain:  Filed Vitals:   09/23/15 1304  PainSc: [place]

## 2015-09-23 NOTE — Transfer of Care (Signed)
Immediate Anesthesia Transfer of Care Note  Patient: Pamela Mejia  Procedure(s) Performed: Procedure(s): COLOSTOMY TAKEDOWN (N/A) HERNIA REPAIR PARASTOMAL (N/A)  Patient Location: PACU  Anesthesia Type:General  Level of Consciousness:  sedated, patient cooperative and responds to stimulation  Airway & Oxygen Therapy:Patient Spontanous Breathing and Patient connected to face mask oxgen  Post-op Assessment:  Report given to PACU RN and Post -op Vital signs reviewed and stable  Post vital signs:  Reviewed and stable  Last Vitals:  Filed Vitals:   09/23/15 0400 09/23/15 0500  BP: 131/54 113/54  Pulse: 51 54  Temp:    Resp: 26 17    Complications: No apparent anesthesia complications

## 2015-09-23 NOTE — Interval H&P Note (Signed)
History and Physical Interval Note:  09/23/2015 7:29 AM  Pamela Mejia  has presented today for surgery, with the diagnosis of History of diverticulitis  The various methods of treatment have been discussed with the patient and family. After consideration of risks, benefits and other options for treatment, the patient has consented to  Procedure(s): COLOSTOMY TAKEDOWN (N/A) HERNIA REPAIR PARASTOMAL (N/A) as a surgical intervention .  The patient's history has been reviewed, patient examined, no change in status, stable for surgery.  I have reviewed the patient's chart and labs.  Questions were answered to the patient's satisfaction.     Airel Magadan

## 2015-09-23 NOTE — ED Notes (Signed)
Pt from [facility] with complaints of increased nausea and vomiting after her colostomy placement. Pt had 4mg  of zofran with EMS. Pt states she has surgery here at 5am.

## 2015-09-23 NOTE — ED Notes (Signed)
Pt being transported to Pre-OP from ED;OR has been notified by RN

## 2015-09-23 NOTE — ED Provider Notes (Signed)
CSN: [id number]     Arrival date & time 09/23/15  0038 History  By signing my name below, I, Pamela Mejia, attest that this documentation has been prepared under the direction and in the presence of [name], MD. Electronically Signed: Irene Mejia, ED Scribe. 09/23/2015. 12:45 AM.  No chief complaint on file.  The history is provided by the patient. No language interpreter was used.  HPI Comments: Pamela Mejia is a 82 y.o. female with a hx of HTN, cancer, diverticulitis, diverticulosis, GERD, and inguinal hernia brought in by EMS who presents to the Emergency Department complaining of nausea onset 10 hours ago. Pt reports associated vomiting and chills. She states that she is due to have her colostomy reversed in the morning. She reports that she took the medication she is supposed to prior to the surgery tomorrow when she began to feel nauseous. She reports that she took the last dose at 10 PM and began to vomit bilious and brown material. She denies abdominal pain unless area is palpated. She denies fever or diarrhea.   Past Medical History  Diagnosis Date  . Asthma   . Hypothyroidism   . Hypertension   . Cancer ([place])     Ocular melanoma  . Diverticulosis   . Diverticulitis   . Blood clotting disorder ([place])   . Status post dilation of esophageal narrowing   . Gallstones   . Kidney stones   . Inguinal hernia     left  . Blind right eye   . Cataract     left  . GERD (gastroesophageal reflux disease)   . Arthritis   . Anemia   . Complication of anesthesia     has a soy allergy   Past Surgical History  Procedure Laterality Date  . Eye surgery  1994    radioactive plaque  . Cholecystectomy  1996       . Bunionectomy Right   . Bladder surgery  2008  . Abdominal hysterectomy  1966  . Laparoscopy N/A 02/03/2015    Procedure: DIAGNOSTIC LAPAROSCOPY/OPEN SIGMOID COLECTOMY WITH TAKE DOWN OF SPLENIC FLEXURE AND END COLOSTOMY;  Surgeon: Stark Klein, MD;  Location: [facility];   Service: General;  Laterality: N/A;  . Breast biopsy Bilateral   . Throat biopsy    . Trigger finger release    . Colonoscopy     Family History  Problem Relation Age of Onset  . Hypertension Mother   . Alzheimer's disease Mother   . Stroke Father   . Stroke Daughter   . Diabetes Son   . Colon cancer Neg Hx    Social History  Substance Use Topics  . Smoking status: Never Smoker   . Smokeless tobacco: Never Used  . Alcohol Use: No   OB History    No data available     Review of Systems  Constitutional: Positive for chills.  Gastrointestinal: Positive for nausea, vomiting and abdominal pain. Negative for diarrhea.  All other systems reviewed and are negative.  Allergies  Eggs or egg-derived products; Penicillins; Soy allergy; and Tylenol with codeine #3  Home Medications   Prior to Admission medications   Medication Sig Start Date End Date Taking? Authorizing Provider  amLODipine (NORVASC) 5 MG tablet TK 1 T PO D 06/04/15   Historical Provider, MD  docusate sodium (COLACE) 100 MG capsule Take 100 mg by mouth daily as needed for mild constipation.    Historical Provider, MD  Fluticasone-Salmeterol (ADVAIR) 100-50 MCG/DOSE AEPB Inhale 1  puff into the lungs 2 (two) times daily.    Historical Provider, MD  hydrochlorothiazide (MICROZIDE) 12.5 MG capsule Take 12.5 mg by mouth daily.    Historical Provider, MD  Levothyroxine Sodium 25 MCG CAPS Take 25 mcg by mouth daily before breakfast.     Historical Provider, MD  losartan (COZAAR) 100 MG tablet TK 1 T PO D 06/04/15   Historical Provider, MD   BP 151/58 mmHg  Pulse 50  Temp(Src) 98.4 F (36.9 C) (Oral)  Resp 19  SpO2 95% Physical Exam  Constitutional: She is oriented to person, place, and time. She appears well-developed and well-nourished.  HENT:  Head: Normocephalic and atraumatic.  Eyes: EOM are normal. Pupils are equal, round, and reactive to light.  Neck: Normal range of motion. Neck supple. No JVD present.   Cardiovascular: Normal rate, regular rhythm and normal heart sounds.  Exam reveals no gallop and no friction rub.   No murmur heard. Pulmonary/Chest: Effort normal and breath sounds normal. She has no wheezes. She has no rales. She exhibits no tenderness.  Abdominal: Soft. She exhibits no mass. Bowel sounds are decreased. There is generalized tenderness. There is no rebound and no guarding.  Colostomy present LLQ with incisional hernia lateral to colostomy; generalized abdominal tenderness  Musculoskeletal: Normal range of motion. She exhibits no edema.  Lymphadenopathy:    She has no cervical adenopathy.  Neurological: She is alert and oriented to person, place, and time. No cranial nerve deficit. She exhibits normal muscle tone. Coordination normal.  Skin: Skin is warm and dry. No rash noted.  Psychiatric: She has a normal mood and affect. Her behavior is normal. Judgment and thought content normal.  Nursing note and vitals reviewed.   ED Course  Procedures (including critical care time) DIAGNOSTIC STUDIES: Oxygen Saturation is 95% on RA, adequate by my interpretation.    COORDINATION OF CARE: 12:43 AM-Discussed treatment plan which includes labs, CT scan and IV fluids with pt at bedside and pt agreed to plan.    Labs Review Results for orders placed or performed during the hospital encounter of 09/23/15  CBC with Differential  Result Value Ref Range   WBC 6.7 4.0 - 10.5 K/uL   RBC 3.99 3.87 - 5.11 MIL/uL   Hemoglobin 12.0 12.0 - 15.0 g/dL   HCT 36.4 36.0 - 46.0 %   MCV 91.2 78.0 - 100.0 fL   MCH 30.1 26.0 - 34.0 pg   MCHC 33.0 30.0 - 36.0 g/dL   RDW 13.0 11.5 - 15.5 %   Platelets 215 150 - 400 K/uL   Neutrophils Relative % 67 %   Neutro Abs 4.5 1.7 - 7.7 K/uL   Lymphocytes Relative 23 %   Lymphs Abs 1.5 0.7 - 4.0 K/uL   Monocytes Relative 8 %   Monocytes Absolute 0.5 0.1 - 1.0 K/uL   Eosinophils Relative 2 %   Eosinophils Absolute 0.1 0.0 - 0.7 K/uL   Basophils  Relative 0 %   Basophils Absolute 0.0 0.0 - 0.1 K/uL  Lipase, blood  Result Value Ref Range   Lipase 19 11 - 51 U/L  Comprehensive metabolic panel  Result Value Ref Range   Sodium 133 (L) 135 - 145 mmol/L   Potassium 3.4 (L) 3.5 - 5.1 mmol/L   Chloride 103 101 - 111 mmol/L   CO2 23 22 - 32 mmol/L   Glucose, Bld 111 (H) 65 - 99 mg/dL   BUN 17 6 - 20 mg/dL   Creatinine, Ser  1.06 (H) 0.44 - 1.00 mg/dL   Calcium 9.7 8.9 - 10.3 mg/dL   Total Protein 6.1 (L) 6.5 - 8.1 g/dL   Albumin 3.5 3.5 - 5.0 g/dL   AST 24 15 - 41 U/L   ALT 22 14 - 54 U/L   Alkaline Phosphatase 90 38 - 126 U/L   Total Bilirubin 1.2 0.3 - 1.2 mg/dL   GFR calc non Af Amer 46 (L) >60 mL/min   GFR calc Af Amer 54 (L) >60 mL/min   Anion gap 7 5 - 15   Imaging Review Ct Abdomen Pelvis W Contrast  09/23/2015  CLINICAL DATA:  Worsening nausea and vomiting. EXAM: CT ABDOMEN AND PELVIS WITH CONTRAST TECHNIQUE: Multidetector CT imaging of the abdomen and pelvis was performed using the standard protocol following bolus administration of intravenous contrast. CONTRAST:  160mL ISOVUE-300 IOPAMIDOL (ISOVUE-300) INJECTION 61% COMPARISON:  04/26/2015 FINDINGS: Lower chest: Mild stable linear scarring or atelectasis in the bases, particularly adjacent to the large hiatal hernia. Hepatobiliary: Cholecystectomy.  Unremarkable liver and bile ducts. Pancreas: Normal Spleen: Normal Adrenals/Urinary Tract: The adrenals and kidneys are normal in appearance. There is no urinary calculus evident. There is no hydronephrosis or ureteral dilatation. Collecting systems and ureters appear unremarkable. Urinary bladder is remarkable only for a small right-sided bladder diverticulum, and for slight deformity of the anterior surface of the bladder as it protrudes into a wide-mouthed low midline ventral hernia. Stomach/Bowel: Very large hiatal hernia, unchanged. Most of the stomach is above the diaphragm. The hernia continues beyond the uppermost image of this  examination. There is a left peristomal hernia containing unobstructed small and large bowel. No bowel obstruction. No extraluminal air. No focal inflammatory changes of the bowel. Vascular/Lymphatic: The abdominal aorta is normal in caliber. There is mild atherosclerotic calcification. There is no adenopathy in the abdomen or pelvis. Reproductive: Hysterectomy.  No adnexal abnormalities. Other: The large peristomal hernia contains small bowel, large bowel, mesentery and probably omentum. No evidence of incarceration, inflammation or abnormal fluid collection associated with the hernia. Musculoskeletal: No significant skeletal lesion. Unchanged mild spondylolisthesis at L4-5 with diffuse bulging of the uncovered disc. IMPRESSION: 1. Very large hiatal hernia, unchanged. 2. Unchanged large left peristomal hernia containing unobstructed small and large bowel as well as mesentery and probably omentum. 3. No acute findings. Electronically Signed   By: Andreas Newport M.D.   On: 09/23/2015 04:58   I have personally reviewed and evaluated these images and lab results as part of my medical decision-making.    MDM   Final diagnoses:  Non-intractable vomiting with nausea, unspecified vomiting type  Parastomal hernia without obstruction or gangrene    Abdominal pain and nausea which occurred during the course of the bowel prep for take down of colostomy. Old records are reviewed confirming that she is scheduled for reversal of colostomy today. He is given IV hydration, IV ondansetron and sent for CT which show showed no evidence of obstruction. She is discharged with instructions to go for surgery today as scheduled.  I personally performed the services described in this documentation, which was scribed in my presence. The recorded information has been reviewed and is accurate.       [name], MD [id number]

## 2015-09-23 NOTE — H&P (Signed)
Pamela Mejia  Location: [facility] Patient #: [id number] DOB: March 19, 1927 Widowed / Language: Cleophus Molt / Race: White Female   History of Present Illness The patient is a 82 year old female who presents for a laparotomy post-op. Pamela Mejia is an 82 yo female who underwent a diagnostic laparoscopy followed by sigmoid colectomy and takedown of splenic flexure with end colostomy on February 03, 2015. We are hoping to be able to place drains and have her get better without colectomy, but this was not possible because of the amount of purulent material in the belly. She had a course of Levaquin and Flagyl which was switched to vancomycin and meropenem. She was able to advance her diet. Her septic shock resolved. She grew strep bovis from her peritoneal cultures. She has not yet had a colonoscopy. She did have significant complaints about lower extremity edema. She went to [place] postop.]  Pt has continued to improve significantly. She is much stronger and more stable. Her breathing is good.    Allergies Tylenol with Codeine #3 *ANALGESICS - OPIOID* Edema, Shortness of breath. Penicillin G Benzathine & Proc *PENICILLINS*  Medication History Meclizine HCl (25MG  Tablet, Oral) Active. Levothyroxine Sodium (50MCG Tablet, Oral) Active. Naproxen Sodium (220MG  Tablet, Oral prn) Active. AmLODIPine Besylate (5MG  Tablet, Oral) Active. Losartan Potassium (100MG  Tablet, Oral) Active. Omega 3 (1000MG  Capsule, Oral) Active. Florastor (250MG  Capsule, Oral) Active. Naproxen Sodium (220MG  Capsule, Oral) Active. Advair Diskus (100-50MCG/DOSE Aero Pow Br Act, Inhalation) Active. Levothyroxine Sodium (25MCG Tablet, Oral) Active. MAGnesium-Oxide (400 (241.3 Mg)MG Tablet, Oral) Active. TraMADol HCl (50MG  Tablet, Oral prn pain) Active. HydroCHLOROthiazide (25MG  Tablet, Oral daily) Active. Medications Reconciled    Review of Systems  All other systems negative  Vitals    Weight: 131 lb Height: 59in Body Surface Area: 1.54 m Body Mass Index: 26.46 kg/m  Temp.: 97.63F  Pulse: 67 (Regular)  BP: 120/80 (Sitting, Left Arm, Standard)       Physical Exam General Mental Status-Alert. General Appearance-Consistent with stated age. Hydration-Well hydrated. Voice-Normal.  Chest and Lung Exam Chest and lung exam reveals -quiet, even and easy respiratory effort with no use of accessory muscles. Inspection Chest Wall - Normal. Back - normal.  Cardiovascular Cardiovascular examination reveals -normal pedal pulses bilaterally. Note: regular rate and rhythm  Abdomen Note: Left-sided parastomal hernia is stable. Ostomy is pink and patent. There is stool and gas in the bag. Midline incision is closed.     Assessment & Plan  DIVERTICULITIS OF COLON WITH PERFORATION (K57.20) Impression: Patient has colonoscopy scheduled for tomorrow.  She has improved her strength and has thought about present and cons of surgery. I think given the fact that she has a reasonable sized parastomal hernia, but it would be better to have an elective colostomy takedown than to have an emergent issue with her parastomal hernia.  I reviewed the risks of surgery including bleeding, infection, damage to adjacent structures, possible leak with repeat ostomy, possible heart and lung complications, possible deconditioning, definite need to go to a nursing home for a brief stay. Possible death.  She understands risks and agrees to proceed. Current Plans Schedule for Surgery Pt Education - [facility] Colon Bowel Prep 2015 Miralax/Antibiotics Started Neomycin Sulfate 500MG , 2 (two) Tablet SEE NOTE, #6, 08/12/2015, No Refill. Local Order: TAKE TWO TABLETS AT 2 PM, 3 PM, AND 10 PM THE DAY PRIOR TO SURGERY Started Flagyl 500MG , 2 (two) Tablet SEE NOTE, #6, 08/12/2015, No Refill. Local Order: Take at 2pm, 3pm, and 10pm the day  prior to your colon  operation   Signed by Stark Klein, MD

## 2015-09-23 NOTE — Op Note (Signed)
PRE-OPERATIVE DIAGNOSIS: history of sigmoid diverticulitis with perforation and ostomy  POST-OPERATIVE DIAGNOSIS:  Same  PROCEDURE:  Procedure(s): Colostomy takedown with resection of colon, repair of parastomal hernia  SURGEON:  Surgeon(s): Stark Klein, MD  ASSISTANT:   Neysa Bonito, MD  ANESTHESIA:   general  DRAINS: none   LOCAL MEDICATIONS USED:  NONE  SPECIMEN:  Source of Specimen:  colostomy and anastamotic rings  DISPOSITION OF SPECIMEN:  PATHOLOGY  COUNTS:  YES  DICTATION: .Dragon Dictation  PLAN OF CARE: Admit to inpatient   PATIENT DISPOSITION:  PACU - hemodynamically stable.   EBL: 50 mL  FINDINGS: no obstruction, small to moderate parastomal hernia.     PROCEDURE:  Patient was identified in the holding area and taken to the operating room where she was placed supine on the operating room table. General anesthesia was induced. A Foley catheter was placed. The patient was then placed in the low lithotomy position. The stoma was closed with a 2-0 Prolene.  Patient's abdomen was prepped and draped in sterile fashion. Timeout was performed according to the surgical safety checklist. When all was correct, we continued. A midline incision was made with a #10 blade taking care to excise the prior scar. The subcutaneous tissues were divided with the cautery. The peritoneum was entered sharply. Fascia was elevated with Kocher clamps and care was taken to take down the intra-abdominal adhesions sharply and then the fascia was opened with the cautery. The omentum was elevated superiorly as possible. There was a portion of omentum stuck in the pelvis which was dissected free with Metzenbaum scissors. A Bookwalter retractor was then used for visualization. The small bowel was freed up from the pelvis and tucked up into the right upper quadrant. The omentum was tucked up into the left upper quadrant.   The sutures from the sigmoid resection were identified and elevated.  At this  point, the ostomy was then addressed.  A circumferential incision was made around the ostomy with a #15 blade. The cautery was used to dissect the ostomy from the surrounding tissues. The Allis clamps were used to elevate the ostomy. A tonsil was then used to separate the subcutaneous fat from the ostomy wall. Once this had been done adequately, the ostomy site was pulled into the abdomen. The remainder of the reducible peristomal hernia was taken down to the abdomen.    The ostomy site was then resected. The sizers were used to decide that the 33 mm EEA was the appropriate stapler this patient. The anvil was then placed into the distal descending colon and a 0 Prolene pursestring suture was placed around this. This was tied down taking care to make sure that the colon wall was tied around the anvil. The assistant then went below and advanced the EEA stapler into the rectum. The vagina was checked to make sure stapler was in the rectum. Spike was then deployed through the rectal wall. This was coupled with the handle. Care was taken to make sure that the colon was not twisted. This tightened down. The vagina was then rechecked to ensure that the stapler was not present in the vagina and and that the stapler was not incorporating the vaginal wall into the anterior portion of the staple line. The stapler was then fired. Pressure was held for 30 seconds. The stapler was then opened and very gently removed. The bowel clamp was placed on the descending colon and irrigation was placed in the pelvis. The assistant insufflated the rectum with  the proctoscope.  There was no sign of leakage. The assistant reviewed the anastomotic location there is no sign of bleeding. This appear intact. The anastomotic rings were then examined and both were seen to be intact.    The abdomen was reirrigated and the air was allowed to evacuate from the rectum. The Bookwalter was removed as well as all the laparotomy sponges. The drapes,  suction, and Bovie were changed and covered according to the colon protocol.    The parastomal hernia was then addressed. The fascial defect was closed primarily in 2 layers of #1 Novafil pops. There is no residual fascial defect. The On-Q catheters were placed on either side of the incision in the pre-peritoneal space.  The fascia was then closed using #1 loop PDS suture x2. The wounds were irrigated and inspected for hemostasis. The skin was then closed using a skin stapler.  The wounds were then dressed using the honeycomb dressings. The catheters were advanced to the On-Q tunnelers and secured in place as well. The patient was allowed to hemorrhage from anesthesia. The  was taken to the PACU in stable condition. Needle, sponge, and instrument counts were correct x2.

## 2015-09-23 NOTE — Anesthesia Procedure Notes (Signed)
Procedure Name: Intubation Date/Time: 09/23/2015 9:32 AM Performed by: Freddie Breech Pre-anesthesia Checklist: Patient identified, Emergency Drugs available, Suction available, Patient being monitored and Timeout performed Patient Re-evaluated:Patient Re-evaluated prior to inductionOxygen Delivery Method: Circle system utilized Preoxygenation: Pre-oxygenation with 100% oxygen Intubation Type: IV induction and Rapid sequence Laryngoscope Size: Mac and 3 Grade View: Grade I Tube size: 7.0 mm Number of attempts: 1 Airway Equipment and Method: Patient positioned with wedge pillow and Stylet Placement Confirmation: ETT inserted through vocal cords under direct vision,  positive ETCO2,  CO2 detector and breath sounds checked- equal and bilateral Secured at: 19 cm Tube secured with: Tape Dental Injury: Teeth and Oropharynx as per pre-operative assessment

## 2015-09-24 ENCOUNTER — Ambulatory Visit: Payer: Medicare Other | Admitting: Pulmonary Disease

## 2015-09-24 ENCOUNTER — Encounter (HOSPITAL_COMMUNITY): Payer: Self-pay | Admitting: General Surgery

## 2015-09-24 LAB — BASIC METABOLIC PANEL
Anion gap: 9 (ref 5–15)
BUN: 11 mg/dL (ref 6–20)
CALCIUM: 9 mg/dL (ref 8.9–10.3)
CHLORIDE: 105 mmol/L (ref 101–111)
CO2: 21 mmol/L — AB (ref 22–32)
Creatinine, Ser: 1 mg/dL (ref 0.44–1.00)
GFR calc Af Amer: 57 mL/min — ABNORMAL LOW (ref >60)
GFR calc non Af Amer: 50 mL/min — ABNORMAL LOW (ref >60)
Glucose, Bld: 136 mg/dL — ABNORMAL HIGH (ref 65–99)
Potassium: 3.6 mmol/L (ref 3.5–5.1)
Sodium: 135 mmol/L (ref 135–145)

## 2015-09-24 LAB — CBC
HEMATOCRIT: 31.8 % — AB (ref 36.0–46.0)
HEMOGLOBIN: 10.5 g/dL — AB (ref 12.0–15.0)
MCH: 30 pg (ref 26.0–34.0)
MCHC: 33 g/dL (ref 30.0–36.0)
MCV: 90.9 fL (ref 78.0–100.0)
Platelets: 188 10*3/uL (ref 150–400)
RBC: 3.5 MIL/uL — ABNORMAL LOW (ref 3.87–5.11)
RDW: 13.4 % (ref 11.5–15.5)
WBC: 16.6 10*3/uL — ABNORMAL HIGH (ref 4.0–10.5)

## 2015-09-24 NOTE — Evaluation (Signed)
Physical Therapy Evaluation Patient Details Name: Pamela Mejia MRN: [id number] DOB: 1927-02-03 Today's Date: 09/24/2015   History of Present Illness  pt presents with Colostomy Take down and Parastomal Hernia Repair.  pt with hx of HTN, Ocular Melanoma with R eye blind, L Cataract, and Colostomy.    Clinical Impression  Pt very painful with minimal mobility.  While attempting to sit at EOB pt was noting increasing pain and O2 sats decreasing while on RA.  Pt placed back on 2L O2 and returned to supine.  Feel pt is going to need increased A at D/C and would benefit from SNF level of care and therapies.      Follow Up Recommendations SNF    Equipment Recommendations  None recommended by PT    Recommendations for Other Services       Precautions / Restrictions Precautions Precautions: Fall Precaution Comments: Abdominal Incision Restrictions Weight Bearing Restrictions: No      Mobility  Bed Mobility Overal bed mobility: Needs Assistance;+2 for physical assistance Bed Mobility: Rolling;Sidelying to Sit;Sit to Supine Rolling: Min assist;+2 for physical assistance Sidelying to sit: Mod assist;+2 for physical assistance   Sit to supine: Mod assist;+2 for physical assistance   General bed mobility comments: pt very painful with mobility.  pt does give good effort with use of UEs and following cues for bed mobility.    Transfers                    Ambulation/Gait                Stairs            Wheelchair Mobility    Modified Rankin (Stroke Patients Only)       Balance Overall balance assessment: Needs assistance Sitting-balance support: Bilateral upper extremity supported;Feet supported Sitting balance-Leahy Scale: Poor Sitting balance - Comments: pt only able to tolerate sitting EOB ~26mins due to increasing pain and decreasing O2 sats.                                       Pertinent Vitals/Pain Pain Assessment: 0-10 Pain  Score: 8  Pain Location: Abdomen Pain Descriptors / Indicators: Sharp;Grimacing;Guarding Pain Intervention(s): Monitored during session;Premedicated before session;Repositioned    Home Living Family/patient expects to be discharged to:: Skilled nursing facility                      Prior Function Level of Independence: Independent         Comments: pt indicates she was living at a Senior Apartment     Hand Dominance   Dominant Hand: Right    Extremity/Trunk Assessment   Upper Extremity Assessment: Defer to OT evaluation           Lower Extremity Assessment: Generalized weakness      Cervical / Trunk Assessment: Kyphotic  Communication   Communication: No difficulties  Cognition Arousal/Alertness: Awake/alert Behavior During Therapy: WFL for tasks assessed/performed Overall Cognitive Status: Within Functional Limits for tasks assessed                      General Comments      Exercises        Assessment/Plan    PT Assessment Patient needs continued PT services  PT Diagnosis Difficulty walking;Generalized weakness;Acute pain   PT Problem List Decreased strength;Decreased activity tolerance;Decreased balance;Decreased  mobility;Decreased knowledge of use of DME;Pain  PT Treatment Interventions DME instruction;Gait training;Functional mobility training;Therapeutic activities;Therapeutic exercise;Balance training;Patient/family education   PT Goals (Current goals can be found in the Care Plan section) Acute Rehab PT Goals Patient Stated Goal: Feel better PT Goal Formulation: With patient Time For Goal Achievement: 10/08/15 Potential to Achieve Goals: Good    Frequency Min 3X/week   Barriers to discharge Decreased caregiver support      Co-evaluation               End of Session   Activity Tolerance: Patient limited by pain Patient left: in bed;with call bell/phone within reach Nurse Communication: Mobility status          Time: [id number] PT Time Calculation (min) (ACUTE ONLY): 25 min   Charges:   PT Evaluation $PT Eval Moderate Complexity: 1 Procedure PT Treatments $Therapeutic Activity: 8-22 mins   PT G CodesCatarina Hartshorn, [place] [phone] 09/24/2015, 11:58 AM

## 2015-09-24 NOTE — Progress Notes (Signed)
1 Day Post-Op  Subjective: Sore, but using PCA.    Objective: Vital signs in last 24 hours: Temp:  [97.4 F (36.3 C)-99.1 F (37.3 C)] 98.1 F (36.7 C) (06/30 1305) Pulse Rate:  [59-84] 62 (06/30 1305) Resp:  [16-33] 16 (06/30 1305) BP: (117-137)/(45-72) 136/52 mmHg (06/30 1305) SpO2:  [97 %-99 %] 98 % (06/30 1305) Weight:  [68.947 kg (152 lb)] 68.947 kg (152 lb) (06/29 1343) Last BM Date: 09/23/15  Intake/Output from previous day: 06/29 0701 - 06/30 0700 In: 3566.7 [P.O.:160; I.V.:3406.7] Out: 1975 [Urine:1825; Blood:150] Intake/Output this shift: Total I/O In: 120 [P.O.:120] Out: -   General appearance: alert, cooperative and no distress Resp: breathing comfortably GI: soft, non distended, approp tender.  dressing c/d/i.  Lab Results:   Recent Labs  09/23/15 1411 09/24/15 0548  WBC 14.0* 16.6*  HGB 11.9* 10.5*  HCT 36.1 31.8*  PLT 195 188   BMET  Recent Labs  09/23/15 0140 09/23/15 1411 09/24/15 0548  NA 133*  --  135  K 3.4*  --  3.6  CL 103  --  105  CO2 23  --  21*  GLUCOSE 111*  --  136*  BUN 17  --  11  CREATININE 1.06* 1.04* 1.00  CALCIUM 9.7  --  9.0   PT/INR  Recent Labs  09/23/15 0642  LABPROT 14.8  INR 1.14   ABG No results for input(s): PHART, HCO3 in the last 72 hours.  Invalid input(s): PCO2, PO2  Studies/Results: Ct Abdomen Pelvis W Contrast  09/23/2015  CLINICAL DATA:  Worsening nausea and vomiting. EXAM: CT ABDOMEN AND PELVIS WITH CONTRAST TECHNIQUE: Multidetector CT imaging of the abdomen and pelvis was performed using the standard protocol following bolus administration of intravenous contrast. CONTRAST:  170mL ISOVUE-300 IOPAMIDOL (ISOVUE-300) INJECTION 61% COMPARISON:  04/26/2015 FINDINGS: Lower chest: Mild stable linear scarring or atelectasis in the bases, particularly adjacent to the large hiatal hernia. Hepatobiliary: Cholecystectomy.  Unremarkable liver and bile ducts. Pancreas: Normal Spleen: Normal Adrenals/Urinary  Tract: The adrenals and kidneys are normal in appearance. There is no urinary calculus evident. There is no hydronephrosis or ureteral dilatation. Collecting systems and ureters appear unremarkable. Urinary bladder is remarkable only for a small right-sided bladder diverticulum, and for slight deformity of the anterior surface of the bladder as it protrudes into a wide-mouthed low midline ventral hernia. Stomach/Bowel: Very large hiatal hernia, unchanged. Most of the stomach is above the diaphragm. The hernia continues beyond the uppermost image of this examination. There is a left peristomal hernia containing unobstructed small and large bowel. No bowel obstruction. No extraluminal air. No focal inflammatory changes of the bowel. Vascular/Lymphatic: The abdominal aorta is normal in caliber. There is mild atherosclerotic calcification. There is no adenopathy in the abdomen or pelvis. Reproductive: Hysterectomy.  No adnexal abnormalities. Other: The large peristomal hernia contains small bowel, large bowel, mesentery and probably omentum. No evidence of incarceration, inflammation or abnormal fluid collection associated with the hernia. Musculoskeletal: No significant skeletal lesion. Unchanged mild spondylolisthesis at L4-5 with diffuse bulging of the uncovered disc. IMPRESSION: 1. Very large hiatal hernia, unchanged. 2. Unchanged large left peristomal hernia containing unobstructed small and large bowel as well as mesentery and probably omentum. 3. No acute findings. Electronically Signed   By: Andreas Newport M.D.   On: 09/23/2015 04:58    Anti-infectives: Anti-infectives    Start     Dose/Rate Route Frequency Ordered Stop   09/23/15 0745  gentamicin (GARAMYCIN) 300 mg in dextrose 5 %  50 mL IVPB  Status:  Discontinued     300 mg 115 mL/hr over 30 Minutes Intravenous To Surgery 09/23/15 0734 09/23/15 1331   09/23/15 0722  clindamycin (CLEOCIN) 900 MG/50ML IVPB    Comments:  Pamela Mejia   : cabinet  override      09/23/15 0722 09/23/15 0842   09/23/15 0722  gentamicin (GARAMYCIN) 1.6-0.9 MG/ML-% IVPB  Status:  Discontinued    Comments:  Pamela Mejia   : cabinet override      09/23/15 0722 09/23/15 0736   09/23/15 0605  neomycin (MYCIFRADIN) tablet 1,000 mg  Status:  Discontinued     1,000 mg Oral 3 times per day 09/23/15 0605 09/23/15 1331   09/23/15 0605  metroNIDAZOLE (FLAGYL) tablet 1,000 mg  Status:  Discontinued     1,000 mg Oral 3 times per day 09/23/15 0605 09/23/15 1331      Assessment/Plan: s/p Procedure(s): COLOSTOMY TAKEDOWN (N/A) HERNIA REPAIR PARASTOMAL (N/A) foley to come out 6 am tomorrow  Pain control Clears. Await return of bowel function Entereg.    LOS: 1 day    [facility] 09/24/2015

## 2015-09-25 LAB — CBC
HCT: 32 % — ABNORMAL LOW (ref 36.0–46.0)
HEMOGLOBIN: 10.3 g/dL — AB (ref 12.0–15.0)
MCH: 30.7 pg (ref 26.0–34.0)
MCHC: 32.2 g/dL (ref 30.0–36.0)
MCV: 95.5 fL (ref 78.0–100.0)
Platelets: 175 10*3/uL (ref 150–400)
RBC: 3.35 MIL/uL — AB (ref 3.87–5.11)
RDW: 13.9 % (ref 11.5–15.5)
WBC: 11.8 10*3/uL — AB (ref 4.0–10.5)

## 2015-09-25 LAB — BASIC METABOLIC PANEL
ANION GAP: 7 (ref 5–15)
BUN: 8 mg/dL (ref 6–20)
CHLORIDE: 111 mmol/L (ref 101–111)
CO2: 23 mmol/L (ref 22–32)
Calcium: 9.3 mg/dL (ref 8.9–10.3)
Creatinine, Ser: 0.97 mg/dL (ref 0.44–1.00)
GFR calc Af Amer: 60 mL/min — ABNORMAL LOW (ref >60)
GFR calc non Af Amer: 51 mL/min — ABNORMAL LOW (ref >60)
Glucose, Bld: 104 mg/dL — ABNORMAL HIGH (ref 65–99)
POTASSIUM: 4 mmol/L (ref 3.5–5.1)
SODIUM: 141 mmol/L (ref 135–145)

## 2015-09-25 NOTE — Clinical Social Work Note (Signed)
Clinical Social Work Assessment  Patient Details  Name: Pamela Mejia MRN: [id number] Date of Birth: Jan 03, 1927  Date of referral:                  Reason for consult:  Discharge Planning                Permission sought to share information with:  Case Manager, Facility Sport and exercise psychologist, Family Supports Permission granted to share information::  Yes, Verbal Permission Granted  Name::        Agency::     Relationship::     Contact Information:     Housing/Transportation Living arrangements for the past 2 months:  [place] of Information:  Patient, Medical Team Patient Interpreter Needed:  None Criminal Activity/Legal Involvement Pertinent to Current Situation/Hospitalization:  No - Comment as needed Significant Relationships:  Adult Children Lives with:    Do you feel safe going back to the place where you live?  No Need for family participation in patient care:  Yes (Comment)  Care giving concerns: Pt's son expressed concern about choosing home health services versus SNF for his mother. Pt son agreeable to SNF for higher level of care.    Social Worker assessment / plan: Holiday representative met with patient to discuss CSW role in discharge planning. Pt shares that she lives was admitted from [facility], and receives ongoing home health care. CSW explained PT recommendation for SNF for short-term rehab. Pt agreeable to SNF and stated she would like to go to [facility] when medically stable for discharge. CSW also spoke with pt's son via phone and son stated he is agreeable for his mother to go to [facility] for SNF. Pt son also requested that CSW contact agency providing pt with home health services to see if pt's nurse feels that pt could receive PT at home.   Employment status:  Retired Forensic scientist:  Medicare PT Recommendations:  [place] / Referral to community resources:  [place]  Patient/Family's Response to care:  Pt pleasant and lying in bed. Pt understands SNF recommendation and Pt appears happy with care she is receiving at [facility].   Patient/Family's Understanding of and Emotional Response to Diagnosis, Current Treatment, and Prognosis: Pt and son appear to have a good understanding for pt admission into hospital and with pt care plan.   Emotional Assessment Appearance:  Appears older than stated age, Well-Groomed Attitude/Demeanor/Rapport:   (Pleasant) Affect (typically observed):  Accepting, Pleasant, Hopeful Orientation:  Oriented to Self, Oriented to Place, Oriented to  Time, Oriented to Situation Alcohol / Substance use:  Not Applicable Psych involvement (Current and /or in the community):  No (Comment)  Discharge Needs  Concerns to be addressed:  Discharge Planning Concerns Readmission within the last 30 days:  No Current discharge risk:  None Barriers to Discharge:  Continued Medical Work up   [employer], LCSW 09/25/2015, 1:00 PM

## 2015-09-25 NOTE — NC FL2 (Signed)
[place] LEVEL OF CARE SCREENING TOOL     IDENTIFICATION  Patient Name: Pamela Mejia Birthdate: December 07, 1926 Sex: female Admission Date (Current Location): 09/23/2015  [facility] and [place] Number:  Herbalist and Address:  [facility], [place] [address] [place], [place] [address]      Provider Number: [id number]  Attending Physician Name and Address:  Stark Klein, MD  Relative Name and Phone Number:       Current Level of Care: SNF Recommended Level of Care: [place] Prior Approval Number:    Date Approved/Denied:   PASRR Number:  ([id number])  Discharge Plan: SNF    Current Diagnoses: Patient Active Problem List   Diagnosis Date Noted  . History of diverticulitis 09/23/2015  . Asthma, moderate persistent 02/03/2015  . Hypothyroidism 02/03/2015  . Essential hypertension 02/03/2015  . Peritonitis ([place]) 02/03/2015    Orientation RESPIRATION BLADDER Height & Weight     Self, Time, Situation, Place  O2 (1l/min) Continent Weight: 152 lb (68.947 kg) Height:  5' (152.4 cm)  BEHAVIORAL SYMPTOMS/MOOD NEUROLOGICAL BOWEL NUTRITION STATUS   (none)  (none) Continent Diet (full liquid)  AMBULATORY STATUS COMMUNICATION OF NEEDS Skin   Extensive Assist Verbally Surgical wounds                       Personal Care Assistance Level of Assistance  Bathing, Feeding, Dressing Bathing Assistance: Limited assistance Feeding assistance: Independent Dressing Assistance: Limited assistance     Functional Limitations Info  Sight, Hearing, Speech Sight Info: Adequate Hearing Info: Adequate Speech Info: Adequate    SPECIAL CARE FACTORS FREQUENCY  PT (By licensed PT)     PT Frequency:  (5x/week)              Contractures Contractures Info: Not present    Additional Factors Info  Code Status, Allergies Code Status Info:  (full) Allergies Info:  (Eggs Or Egg-derived Products, Penicillins, Soy Allergy,  Tylenol With Codeine #3)           Current Medications (09/25/2015):  This is the current hospital active medication list Current Facility-Administered Medications  Medication Dose Route Frequency Provider Last Rate Last Dose  . alvimopan (ENTEREG) capsule 12 mg  12 mg Oral BID Stark Klein, MD   12 mg at 09/24/15 2125  . amLODipine (NORVASC) tablet 5 mg  5 mg Oral Daily Stark Klein, MD   5 mg at 09/25/15 0828  . bupivacaine ON-Q pain pump   Other Continuous Stark Klein, MD      . dextrose 5 % and 0.45 % NaCl with KCl 20 mEq/L infusion   Intravenous Continuous Armandina Gemma, MD 50 mL/hr at 09/25/15 0843    . diphenhydrAMINE (BENADRYL) 12.5 MG/5ML elixir 12.5 mg  12.5 mg Oral Q6H PRN Stark Klein, MD       Or  . diphenhydrAMINE (BENADRYL) injection 12.5 mg  12.5 mg Intravenous Q6H PRN Stark Klein, MD      . diphenhydrAMINE (BENADRYL) injection 12.5 mg  12.5 mg Intravenous Q6H PRN Stark Klein, MD       Or  . diphenhydrAMINE (BENADRYL) 12.5 MG/5ML elixir 12.5 mg  12.5 mg Oral Q6H PRN Stark Klein, MD      . fentaNYL (SUBLIMAZE) injection 12.5-25 mcg  12.5-25 mcg Intravenous Q2H PRN Stark Klein, MD      . fentaNYL 40 mcg/mL PCA injection   Intravenous Q4H Stark Klein, MD      .  heparin injection 5,000 Units  5,000 Units Subcutaneous Q8H Stark Klein, MD   5,000 Units at 09/25/15 0603  . hydrALAZINE (APRESOLINE) injection 10 mg  10 mg Intravenous Q2H PRN Stark Klein, MD      . hydrochlorothiazide (MICROZIDE) capsule 12.5 mg  12.5 mg Oral Daily Stark Klein, MD   12.5 mg at 09/25/15 0828  . levothyroxine (SYNTHROID, LEVOTHROID) tablet 25 mcg  25 mcg Oral QAC breakfast Stark Klein, MD   25 mcg at 09/25/15 [id number]  . losartan (COZAAR) tablet 100 mg  100 mg Oral Daily Stark Klein, MD   100 mg at 09/25/15 0828  . methocarbamol (ROBAXIN) tablet 500 mg  500 mg Oral Q6H PRN Stark Klein, MD      . mometasone-formoterol (DULERA) 100-5 MCG/ACT inhaler 2 puff  2 puff Inhalation BID Stark Klein, MD   2  puff at 09/25/15 0836  . naloxone [facility]) injection 0.4 mg  0.4 mg Intravenous PRN Stark Klein, MD       And  . sodium chloride flush (NS) 0.9 % injection 9 mL  9 mL Intravenous PRN Stark Klein, MD      . ondansetron (ZOFRAN-ODT) disintegrating tablet 4 mg  4 mg Oral Q6H PRN Stark Klein, MD       Or  . ondansetron (ZOFRAN) injection 4 mg  4 mg Intravenous Q6H PRN Stark Klein, MD      . ondansetron (ZOFRAN) injection 4 mg  4 mg Intravenous Q6H PRN Stark Klein, MD      . simethicone (MYLICON) chewable tablet 40 mg  40 mg Oral Q6H PRN Stark Klein, MD       Facility-Administered Medications Ordered in Other Encounters  Medication Dose Route Frequency Provider Last Rate Last Dose  . clindamycin (CLEOCIN) 900 mg in dextrose 5 % 50 mL IVPB  900 mg Intravenous 60 min Pre-Op Stark Klein, MD         Discharge Medications: Please see discharge summary for a list of discharge medications.  Relevant Imaging Results:  Relevant Lab Results:   Additional Information SS#: [id number]  Junie Spencer, LCSW

## 2015-09-25 NOTE — Clinical Social Work Placement (Signed)
   CLINICAL SOCIAL WORK PLACEMENT  NOTE  Date:  09/25/2015  Patient Details  Name: [name] MRN: [id number] Date of Birth: 1926/05/24  Clinical Social Work is seeking post-discharge placement for this patient at the [place] level of care (*CSW will initial, date and re-position this form in  chart as items are completed):  Yes   Patient/family provided with [place] Work Department's list of facilities offering this level of care within the geographic area requested by the patient (or if unable, by the patient's family).  Yes   Patient/family informed of their freedom to choose among providers that offer the needed level of care, that participate in Medicare, Medicaid or managed care program needed by the patient, have an available bed and are willing to accept the patient.  Yes   Patient/family informed of [place]'s ownership interest in [facility] and [facility], as well as of the fact that they are under no obligation to receive care at these facilities.  PASRR submitted to EDS on       PASRR number received on       Existing PASRR number confirmed on 09/25/15     FL2 transmitted to all facilities in geographic area requested by pt/family on 09/25/15     FL2 transmitted to all facilities within larger geographic area on       Patient informed that his/her managed care company has contracts with or will negotiate with certain facilities, including the following:            Patient/family informed of bed offers received.  Patient chooses bed at       Physician recommends and patient chooses bed at      Patient to be transferred to   on  .  Patient to be transferred to facility by       Patient family notified on   of transfer.  Name of family member notified:        PHYSICIAN Please prepare priority discharge summary, including medications, Please prepare prescriptions, Please sign FL2     Additional Comment:     _______________________________________________ Junie Spencer, LCSW 09/25/2015, 1:27 PM

## 2015-09-25 NOTE — Progress Notes (Signed)
Patient ID: Pamela Mejia, female   DOB: 10-May-1926, 82 y.o.   MRN: [id number]  [facility]  Subjective: POD#2 - patient in bed.  Foley out this AM.  Complains of "soreness". No nausea or emesis.  Passing flatus yesterday.  Tolerating clear liqud diet - wants some soup.  Objective: Vital signs in last 24 hours: Temp:  [97.6 F (36.4 C)-99.7 F (37.6 C)] 99.7 F (37.6 C) (07/01 [id number]) Pulse Rate:  [62-67] 67 (07/01 0613) Resp:  [16-30] 16 (07/01 0829) BP: (128-140)/(45-55) 140/53 mmHg (07/01 0613) SpO2:  [97 %-100 %] 97 % (07/01 0836) Last BM Date: 09/24/15  Intake/Output from previous day: 06/30 0701 - 07/01 0700 In: 2909 [P.O.:580; I.V.:2329] Out: 2600 [Urine:2600] Intake/Output this shift:    Physical Exam: HEENT - sclerae clear, mucous membranes moist Neck - soft Chest - clear bilaterally Cor - RRR Abdomen - soft without distension; dressings intact; active BS present Ext - no edema, non-tender Neuro - alert & oriented, no focal deficits  Lab Results:   Recent Labs  09/24/15 0548 09/25/15 0500  WBC 16.6* 11.8*  HGB 10.5* 10.3*  HCT 31.8* 32.0*  PLT 188 175   BMET  Recent Labs  09/24/15 0548 09/25/15 0500  NA 135 141  K 3.6 4.0  CL 105 111  CO2 21* 23  GLUCOSE 136* 104*  BUN 11 8  CREATININE 1.00 0.97  CALCIUM 9.0 9.3   PT/INR  Recent Labs  09/23/15 0642  LABPROT 14.8  INR 1.14   Comprehensive Metabolic Panel:    Component Value Date/Time   NA 141 09/25/2015 0500   NA 135 09/24/2015 0548   K 4.0 09/25/2015 0500   K 3.6 09/24/2015 0548   CL 111 09/25/2015 0500   CL 105 09/24/2015 0548   CO2 23 09/25/2015 0500   CO2 21* 09/24/2015 0548   BUN 8 09/25/2015 0500   BUN 11 09/24/2015 0548   CREATININE 0.97 09/25/2015 0500   CREATININE 1.00 09/24/2015 0548   GLUCOSE 104* 09/25/2015 0500   GLUCOSE 136* 09/24/2015 0548   CALCIUM 9.3 09/25/2015 0500   CALCIUM 9.0 09/24/2015 0548   AST 24 09/23/2015 0140   AST 25 09/17/2015 1430   ALT 22 09/23/2015 0140   ALT 24 09/17/2015 1430   ALKPHOS 90 09/23/2015 0140   ALKPHOS 112 09/17/2015 1430   BILITOT 1.2 09/23/2015 0140   BILITOT 0.6 09/17/2015 1430   PROT 6.1* 09/23/2015 0140   PROT 7.2 09/17/2015 1430   ALBUMIN 3.5 09/23/2015 0140   ALBUMIN 4.1 09/17/2015 1430    Studies/Results: No results found.  Assessment & Plans: Status post colostomy closure and repair parastomal hernia  Advance to full liquid diet  OOB, ambulate  PCA for pain control  Earnstine Regal, MD, [facility] Office: [place] 09/25/2015

## 2015-09-26 LAB — BASIC METABOLIC PANEL
Anion gap: 5 (ref 5–15)
BUN: 9 mg/dL (ref 6–20)
CHLORIDE: 108 mmol/L (ref 101–111)
CO2: 27 mmol/L (ref 22–32)
Calcium: 9.4 mg/dL (ref 8.9–10.3)
Creatinine, Ser: 0.96 mg/dL (ref 0.44–1.00)
GFR calc Af Amer: >60 mL/min (ref >60)
GFR calc non Af Amer: 52 mL/min — ABNORMAL LOW (ref >60)
GLUCOSE: 96 mg/dL (ref 65–99)
POTASSIUM: 4.4 mmol/L (ref 3.5–5.1)
SODIUM: 140 mmol/L (ref 135–145)

## 2015-09-26 LAB — CBC
HCT: 31.1 % — ABNORMAL LOW (ref 36.0–46.0)
Hemoglobin: 9.9 g/dL — ABNORMAL LOW (ref 12.0–15.0)
MCH: 30.3 pg (ref 26.0–34.0)
MCHC: 31.8 g/dL (ref 30.0–36.0)
MCV: 95.1 fL (ref 78.0–100.0)
Platelets: 184 10*3/uL (ref 150–400)
RBC: 3.27 MIL/uL — ABNORMAL LOW (ref 3.87–5.11)
RDW: 14 % (ref 11.5–15.5)
WBC: 9.1 10*3/uL (ref 4.0–10.5)

## 2015-09-26 MED ORDER — HYDROCODONE-ACETAMINOPHEN 5-325 MG PO TABS
1.0000 | ORAL_TABLET | ORAL | Status: DC | PRN
  Administered 2015-09-26: 1 via ORAL
  Administered 2015-09-26 – 2015-09-28 (×4): 2 via ORAL
  Filled 2015-09-26 (×4): qty 2
  Filled 2015-09-26: qty 1

## 2015-09-26 NOTE — Progress Notes (Signed)
Occupational Therapy Evaluation Patient Details Name: Pamela Mejia MRN: [id number] DOB: August 28, 1926 Today's Date: 09/26/2015    History of Present Illness pt presents with Colostomy Take down and Parastomal Hernia Repair.  pt with hx of HTN, Ocular Melanoma with R eye blind, L Cataract, and Colostomy.     Clinical Impression   PTA, pt lived at the [facility] in [place] and had assistance for bathing. Pt very motivated and interactive today. Ambulated @ 25 ft in room with Min A @ RW level. Mod A for LB ADL. Pt will benefit from rehab at SNF. Will follow acutely to maximize functional level of independence and facilitate safe D/C to next venue of care.     Follow Up Recommendations  SNF;Supervision/Assistance - 24 hour    Equipment Recommendations  None recommended by OT    Recommendations for Other Services       Precautions / Restrictions Precautions Precautions: Fall Precaution Comments: Abdominal Incision (pain meds through bulbs) Restrictions Weight Bearing Restrictions: No      Mobility Bed Mobility Overal bed mobility: Needs Assistance; Bed Mobility: Rolling;;Sit to Supine Rolling:   Sit to supine: Mod assist   General bed mobility comments: Educated pt on need to use sidelying to sit to reduce pain during bed mobility  Transfers Overall transfer level: Needs assistance Equipment used: Rolling walker (2 wheeled) Transfers: Sit to/from Stand Sit to Stand: Min assist         General transfer comment: aqmbulated 25 ft    Balance Overall balance assessment: Needs assistance   Sitting balance-Leahy Scale: Good       Standing balance-Leahy Scale: Fair                              ADL Overall ADL's : Needs assistance/impaired     Grooming: Set up;Sitting   Upper Body Bathing: Set up;Supervision/ safety;Sitting   Lower Body Bathing: Moderate assistance;Sit to/from stand   Upper Body Dressing : Minimal assistance;Sitting   Lower  Body Dressing: Moderate assistance;Sit to/from stand   Toilet Transfer: Minimal assistance;RW           Functional mobility during ADLs: Minimal assistance;Rolling walker General ADL Comments: Ambulated @ 25 ft in room     Vision     Perception     Praxis      Pertinent Vitals/Pain Pain Assessment: Faces Faces Pain Scale: Hurts little more Pain Location: abdomen Pain Descriptors / Indicators: Grimacing;Discomfort Pain Intervention(s): Limited activity within patient's tolerance;Monitored during session     Hand Dominance Right   Extremity/Trunk Assessment Upper Extremity Assessment Upper Extremity Assessment: Generalized weakness   Lower Extremity Assessment Lower Extremity Assessment: Defer to PT evaluation   Cervical / Trunk Assessment Cervical / Trunk Assessment: Kyphotic   Communication Communication Communication: No difficulties   Cognition Arousal/Alertness: Awake/alert Behavior During Therapy: WFL for tasks assessed/performed Overall Cognitive Status: Within Functional Limits for tasks assessed                     General Comments       Exercises       Shoulder Instructions      Home Living Family/patient expects to be discharged to:: Skilled nursing facility                                        Prior  Functioning/Environment Level of Independence: Independent        Comments: Lived at [facility] and had assitance with bathing. ambulated without AD    OT Diagnosis: Generalized weakness;Acute pain   OT Problem List: Decreased strength;Decreased range of motion;Decreased activity tolerance;Impaired balance (sitting and/or standing);Decreased safety awareness;Decreased knowledge of use of DME or AE;Pain   OT Treatment/Interventions: Self-care/ADL training;Therapeutic exercise;Energy conservation;DME and/or AE instruction;Therapeutic activities;Patient/family education;Balance training    OT Goals(Current goals can  be found in the care plan section) Acute Rehab OT Goals Patient Stated Goal: go back to [facility] OT Goal Formulation: With patient Time For Goal Achievement: 10/10/15 Potential to Achieve Goals: Good ADL Goals Pt Will Perform Lower Body Bathing: with min assist;with adaptive equipment;sit to/from stand Pt Will Transfer to Toilet: with supervision;ambulating;bedside commode Pt Will Perform Toileting - Clothing Manipulation and hygiene: with supervision;sit to/from stand;sitting/lateral leans Pt/caregiver will Perform Home Exercise Program: Increased strength;Both right and left upper extremity;With theraband  OT Frequency: Min 2X/week   Barriers to D/C:            Co-evaluation              End of Session Equipment Utilized During Treatment: Gait belt;Rolling walker Nurse Communication: Mobility status  Activity Tolerance: Patient tolerated treatment well Patient left: in bed;with call bell/phone within reach;with bed alarm set;with SCD's reapplied   Time: [id number] OT Time Calculation (min): 23 min Charges:  OT General Charges $OT Visit: 1 Procedure OT Evaluation $OT Eval Moderate Complexity: 1 Procedure OT Treatments $Self Care/Home Management : 8-22 mins G-Codes:    Ruthy Forry,HILLARY 26-Oct-2015, 4:30 PM   [facility], OTR/L  [phone] 2015/10/26

## 2015-09-26 NOTE — Progress Notes (Signed)
Patient ID: Pamela Mejia, female   DOB: Jul 05, 1926, 82 y.o.   MRN: [id number]  [facility]  Subjective: POD#3 - patient without complaint.  Has not been OOB to chair yet.  Tolerating full liquid diet.  Passing flatus.  Objective: Vital signs in last 24 hours: Temp:  [97.6 F (36.4 C)-98.7 F (37.1 C)] 98.2 F (36.8 C) (07/02 0536) Pulse Rate:  [58-71] 60 (07/02 0536) Resp:  [16-25] 25 (07/02 0536) BP: (122-147)/(49-60) 147/53 mmHg (07/02 0536) SpO2:  [91 %-98 %] 92 % (07/02 0536) Last BM Date: 09/24/15  Intake/Output from previous day: 07/01 0701 - 07/02 0700 In: 200 [P.O.:200] Out: 1000 [Urine:1000] Intake/Output this shift:    Physical Exam: HEENT - sclerae clear, mucous membranes moist Neck - soft Chest - clear bilaterally Cor - RRR Abdomen - soft without distension; active BS present; dressing and On-Q intact Ext - no edema, non-tender Neuro - alert & oriented, no focal deficits  Lab Results:   Recent Labs  09/25/15 0500 09/26/15 0617  WBC 11.8* 9.1  HGB 10.3* 9.9*  HCT 32.0* 31.1*  PLT 175 184   BMET  Recent Labs  09/25/15 0500 09/26/15 0617  NA 141 140  K 4.0 4.4  CL 111 108  CO2 23 27  GLUCOSE 104* 96  BUN 8 9  CREATININE 0.97 0.96  CALCIUM 9.3 9.4   PT/INR No results for input(s): LABPROT, INR in the last 72 hours. Comprehensive Metabolic Panel:    Component Value Date/Time   NA 140 09/26/2015 0617   NA 141 09/25/2015 0500   K 4.4 09/26/2015 0617   K 4.0 09/25/2015 0500   CL 108 09/26/2015 0617   CL 111 09/25/2015 0500   CO2 27 09/26/2015 0617   CO2 23 09/25/2015 0500   BUN 9 09/26/2015 0617   BUN 8 09/25/2015 0500   CREATININE 0.96 09/26/2015 0617   CREATININE 0.97 09/25/2015 0500   GLUCOSE 96 09/26/2015 0617   GLUCOSE 104* 09/25/2015 0500   CALCIUM 9.4 09/26/2015 0617   CALCIUM 9.3 09/25/2015 0500   AST 24 09/23/2015 0140   AST 25 09/17/2015 1430   ALT 22 09/23/2015 0140   ALT 24  09/17/2015 1430   ALKPHOS 90 09/23/2015 0140   ALKPHOS 112 09/17/2015 1430   BILITOT 1.2 09/23/2015 0140   BILITOT 0.6 09/17/2015 1430   PROT 6.1* 09/23/2015 0140   PROT 7.2 09/17/2015 1430   ALBUMIN 3.5 09/23/2015 0140   ALBUMIN 4.1 09/17/2015 1430    Studies/Results: No results found.  Assessment & Plans: Status post colostomy closure and repair parastomal hernia  Advance to regular diet  Discontinue PCA, start oral Norco  OOB to chair today - discussed with staff  Earnstine Regal, MD, [facility] Office: [place] 09/26/2015

## 2015-09-26 NOTE — Clinical Social Work Note (Signed)
CSW contacted pt son via phone and provided bed offers. Son informed by CSW that [facility] provided bed offer and son indicated he still wants his mother to discharge to [facility] once medically stable. CSW will continue to follow pt and assist with discharge.

## 2015-09-27 MED ORDER — METHOCARBAMOL 500 MG PO TABS: 500.0000 mg | ORAL_TABLET | Freq: Four times a day (QID) | ORAL | Status: DC | PRN

## 2015-09-27 MED ORDER — HYDROCODONE-ACETAMINOPHEN 5-325 MG PO TABS: 1.0000 | ORAL_TABLET | ORAL | Status: AC | PRN

## 2015-09-27 NOTE — Progress Notes (Signed)
Patient ID: Pamela Mejia, female   DOB: [birth year], 82 y.o.   MRN: [id number]  [facility]  Subjective: POD#4 - + flatus and BM.  Objective: Vital signs in last 24 hours: Temp:  [97.7 F (36.5 C)-99 F (37.2 C)] 97.7 F (36.5 C) (07/03 0612) Pulse Rate:  [55-71] 55 (07/03 0612) Resp:  [15-17] 16 (07/02 2100) BP: (118-131)/(48-53) 131/52 mmHg (07/03 0612) SpO2:  [94 %-100 %] 97 % (07/03 0612) Last BM Date: 09/25/15  Intake/Output from previous day: 07/02 0701 - 07/03 0700 In: 2435.8 [I.V.:2435.8] Out: 1250 [Urine:1250] Intake/Output this shift:    Physical Exam: HEENT - sclerae clear, mucous membranes moist Neck - soft Chest - breathing comfortably Abdomen - soft without distension Ext - no edema, non-tender Neuro - alert & oriented, no focal deficits  Lab Results:   Recent Labs  09/25/15 0500 09/26/15 0617  WBC 11.8* 9.1  HGB 10.3* 9.9*  HCT 32.0* 31.1*  PLT 175 184   BMET  Recent Labs  09/25/15 0500 09/26/15 0617  NA 141 140  K 4.0 4.4  CL 111 108  CO2 23 27  GLUCOSE 104* 96  BUN 8 9  CREATININE 0.97 0.96  CALCIUM 9.3 9.4   PT/INR No results for input(s): LABPROT, INR in the last 72 hours. Comprehensive Metabolic Panel:    Component Value Date/Time   NA 140 09/26/2015 0617   NA 141 09/25/2015 0500   K 4.4 09/26/2015 0617   K 4.0 09/25/2015 0500   CL 108 09/26/2015 0617   CL 111 09/25/2015 0500   CO2 27 09/26/2015 0617   CO2 23 09/25/2015 0500   BUN 9 09/26/2015 0617   BUN 8 09/25/2015 0500   CREATININE 0.96 09/26/2015 0617   CREATININE 0.97 09/25/2015 0500   GLUCOSE 96 09/26/2015 0617   GLUCOSE 104* 09/25/2015 0500   CALCIUM 9.4 09/26/2015 0617   CALCIUM 9.3 09/25/2015 0500   AST 24 09/23/2015 0140   AST 25 09/17/2015 1430   ALT 22 09/23/2015 0140   ALT 24 09/17/2015 1430   ALKPHOS 90 09/23/2015 0140   ALKPHOS 112 09/17/2015 1430   BILITOT 1.2 09/23/2015 0140   BILITOT 0.6 09/17/2015 1430   PROT 6.1* 09/23/2015 0140   PROT 7.2 09/17/2015 1430   ALBUMIN 3.5 09/23/2015 0140   ALBUMIN 4.1 09/17/2015 1430    Studies/Results: No results found.  Assessment & Plans: Status post colostomy closure and repair parastomal hernia   regular diet  Discontinue PCA, start oral Norco  OOB to chair today - discussed with staff  Pt would benefit from short term snf.     [facility] Office: [place] 09/27/2015

## 2015-09-27 NOTE — Discharge Summary (Signed)
Physician Discharge Summary  Patient ID: Pamela Mejia MRN: [id number] DOB/AGE: 10/27/1926 82 y.o.  Admit date: 09/23/2015 Discharge date: 09/28/2015  Admission Diagnoses: History of diverticulitis Hypothyroidism HTN Asthma  Discharge Diagnoses:  Active Problems:   History of diverticulitis Same as above  Discharged Condition: stable  Hospital Course:  Pt is an 82 yo F who was admitted to the floor following a colostomy takedown and parastomal hernia repair. She did well other than deconditioning.  She had some pain that was controlled with PCA and OnQ pain pump.  Her bowel function slowly recovered.  She was able to ambulate some with PT, but needed help. Based on her frailty, she is discharged to skilled nursing facility temporarily.  She was able to transition to oral narcotics and void without foley.     Consults: PT/OT  Significant Diagnostic Studies: labs: WBCs 9.1, HCT 31.1, Cr 0.96  Treatments: surgery: see above  Discharge Exam: Blood pressure 118/56, pulse 68, temperature 98.3 F (36.8 C), temperature source Oral, resp. rate 17, height 5' (1.524 m), weight 68.947 kg (152 lb), SpO2 98 %. General appearance: alert, cooperative and mild distress Resp: breathing comfortably Cardio: regular rate and rhythm GI: soft, approp tender.  Some staining on honeycomb dressing.  Disposition: 03-Skilled Nursing Facility     Medication List    STOP taking these medications        metroNIDAZOLE 500 MG tablet  Commonly known as:  FLAGYL     neomycin 500 MG tablet  Commonly known as:  MYCIFRADIN      TAKE these medications        amLODipine 5 MG tablet  Commonly known as:  NORVASC  Take 5 mg by mouth daily.     docusate sodium 100 MG capsule  Commonly known as:  COLACE  Take 100 mg by mouth daily as needed for mild constipation.     Fluticasone-Salmeterol 100-50 MCG/DOSE Aepb  Commonly known as:  ADVAIR  Inhale 1 puff into the lungs 2 (two) times daily.     hydrochlorothiazide 12.5 MG capsule  Commonly known as:  MICROZIDE  Take 12.5 mg by mouth daily.     HYDROcodone-acetaminophen 5-325 MG tablet  Commonly known as:  NORCO/VICODIN  Take 1-2 tablets by mouth every 4 (four) hours as needed for moderate pain.     Levothyroxine Sodium 25 MCG Caps  Take 25 mcg by mouth daily before breakfast.     losartan 100 MG tablet  Commonly known as:  COZAAR  Take 100 mg by mouth daily.     methocarbamol 500 MG tablet  Commonly known as:  ROBAXIN  Take 1 tablet (500 mg total) by mouth every 6 (six) hours as needed for muscle spasms.       Follow-up Information    Follow up with [facility], MD. Schedule an appointment as soon as possible for a visit in 2 weeks.   Specialty:  General Surgery   Why:  For post-operative follow up, For staple removal. please arrive 30 minutes early to get checked in and fill out any necessary paperwork.    Contact information:   [place] [place] [address] [phone]       Signed: Obie Dredge, [facility] Pager: [phone] Consults: [phone] Mon-Fri 7:00 am-4:30 pm Sat-Sun 7:00 am-11:30 am

## 2015-09-27 NOTE — Progress Notes (Signed)
Physical Therapy Treatment Patient Details Name: Pamela Mejia MRN: [id number] DOB: 20-Jan-1927 Today's Date: 09/27/2015    History of Present Illness pt presents with Colostomy Take down and Parastomal Hernia Repair.  pt with hx of HTN, Ocular Melanoma with R eye blind, L Cataract, and Colostomy.      PT Comments    Patient received in bed, awake and ready for therapy. Patient demonstrated good participation in bed mobility and transfers, and required decreased assistance compared to previous session. Patient continues to make good progress towards achieving her goals. She would benefit from discharge to SNF with continued therapy services.  Follow Up Recommendations  SNF     Equipment Recommendations  None recommended by PT    Recommendations for Other Services       Precautions / Restrictions Precautions Precautions: Fall Precaution Comments: Abdominal Incision Restrictions Weight Bearing Restrictions: No    Mobility  Bed Mobility Overal bed mobility: Needs Assistance Bed Mobility: Rolling;Supine to Sit Rolling: Modified independent (Device/Increase time);Supervision   Supine to sit: Min guard     General bed mobility comments: pt needs increased time and use of bedrails; however, no physical assistance is needed.  Transfers Overall transfer level: Needs assistance Equipment used: Rolling walker (2 wheeled) Transfers: Sit to/from Stand Sit to Stand: Min guard         General transfer comment: pt demonstrated good use of bilateral upper extremities on bed and transitioned to RW handles well without cueing.   Ambulation/Gait Ambulation/Gait assistance: Min guard Ambulation Distance (Feet): 40 Feet Assistive device: Rolling walker (2 wheeled) Gait Pattern/deviations: Decreased step length - right;Decreased step length - left;Decreased stride length     General Gait Details: PT attempted to ambulate further distance with pt; however, patient requested to return  to sitting in chair after using the restroom.    Stairs            Wheelchair Mobility    Modified Rankin (Stroke Patients Only)       Balance Overall balance assessment: Needs assistance Sitting-balance support: Feet supported Sitting balance-Leahy Scale: Good     Standing balance support: Bilateral upper extremity supported Standing balance-Leahy Scale: Poor                      Cognition Arousal/Alertness: Awake/alert Behavior During Therapy: WFL for tasks assessed/performed Overall Cognitive Status: Within Functional Limits for tasks assessed                      Exercises      General Comments        Pertinent Vitals/Pain Pain Assessment: Faces Faces Pain Scale: Hurts little more Pain Location: abdomen, right side Pain Descriptors / Indicators: Discomfort;Grimacing Pain Intervention(s): Limited activity within patient's tolerance;Monitored during session;Patient requesting pain meds-RN notified    Home Living                      Prior Function            PT Goals (current goals can now be found in the care plan section) Acute Rehab PT Goals Patient Stated Goal: go back to [facility] PT Goal Formulation: With patient Time For Goal Achievement: 10/08/15 Potential to Achieve Goals: Good Progress towards PT goals: Progressing toward goals    Frequency  Min 3X/week    PT Plan Current plan remains appropriate    Co-evaluation             End  of Session   Activity Tolerance: Patient limited by pain Patient left: in chair;with call bell/phone within reach     Time: 0905-0922 PT Time Calculation (min) (ACUTE ONLY): 17 min  Charges:  $Gait Training: 8-22 mins                    [facility] [place], [place]  [phone] 09/27/2015, 12:14 PM

## 2015-09-27 NOTE — Care Management Important Message (Signed)
Important Message  Patient Details  Name: Pamela Mejia MRN: [id number] Date of Birth: Nov 25, 1926   Medicare Important Message Given:  Yes    Nathen May 09/27/2015, 12:03 PM

## 2015-09-27 NOTE — Discharge Instructions (Signed)
[facility]      [facility] °[phone] ° °ABDOMINAL SURGERY: POST OP INSTRUCTIONS ° °Always review your discharge instruction sheet given to you by the facility where your surgery was performed. ° °IF YOU HAVE DISABILITY OR FAMILY LEAVE FORMS, YOU MUST BRING THEM TO THE OFFICE FOR PROCESSING.  PLEASE DO NOT GIVE THEM TO YOUR DOCTOR. ° °1. A prescription for pain medication may be given to you upon discharge.  Take your pain medication as prescribed, if needed.  If narcotic pain medicine is not needed, then you may take acetaminophen (Tylenol) or ibuprofen (Advil) as needed. °2. Take your usually prescribed medications unless otherwise directed. °3. If you need a refill on your pain medication, please contact your pharmacy. They will contact our office to request authorization.  Prescriptions will not be filled after 5pm or on week-ends. °4. You should follow a light diet the first few days after arrival home, such as soup and crackers, pudding, etc.unless your doctor has advised otherwise. A high-fiber, low fat diet can be resumed as tolerated.   Be sure to include lots of fluids daily. Most patients will experience some swelling and bruising on the chest and neck area.  Ice packs will help.  Swelling and bruising can take several days to resolve °5. Most patients will experience some swelling and bruising in the area of the incision. Ice pack will help. Swelling and bruising can take several days to resolve..  °6. It is common to experience some constipation if taking pain medication after surgery.  Increasing fluid intake and taking a stool softener will usually help or prevent this problem from occurring.  A mild laxative (Milk of Magnesia or Miralax) should be taken according to package directions if there are no bowel movements after 48 hours. °7.  You may have steri-strips (small skin tapes) in place directly over the incision.  These strips should be left on the skin for 10-14 days.  If your  surgeon used skin glue on the incision, you may shower in 48 hours.  The glue will flake off over the next 2-3 weeks.  Any sutures or staples will be removed at the office during your follow-up visit. You may find that a light gauze bandage over your incision may keep your staples from being rubbed or pulled. You may shower and replace the bandage daily. °8. ACTIVITIES:  You may resume regular (light) daily activities beginning the next day--such as daily self-care, walking, climbing stairs--gradually increasing activities as tolerated.  You may have sexual intercourse when it is comfortable.  Refrain from any heavy lifting or straining until approved by your doctor. °a. You may drive when you no longer are taking prescription pain medication, you can comfortably wear a seatbelt, and you can safely maneuver your car and apply brakes °b. Return to Work: __________8 weeks if applicable_________________________ °9. You should see your doctor in the office for a follow-up appointment approximately two weeks after your surgery.  Make sure that you call for this appointment within a day or two after you arrive home to insure a convenient appointment time. °OTHER INSTRUCTIONS:  °_____________________________________________________________ °_____________________________________________________________ ° °WHEN TO CALL YOUR DOCTOR: °1. Fever over 101.0 °2. Inability to urinate °3. Nausea and/or vomiting °4. Extreme swelling or bruising °5. Continued bleeding from incision. °6. Increased pain, redness, or drainage from the incision. °7. Difficulty swallowing or breathing °8. Muscle cramping or spasms. °9. Numbness or tingling in hands or feet or around lips. ° °The clinic staff is   available to answer your questions during regular business hours.  Please don’t hesitate to call and ask to speak to one of the nurses if you have concerns. ° °For further questions, please visit [email or web address] ° ° ° °

## 2015-09-28 MED ORDER — METHOCARBAMOL 500 MG PO TABS: 500.0000 mg | ORAL_TABLET | Freq: Four times a day (QID) | ORAL | Status: DC | PRN

## 2015-09-28 NOTE — Progress Notes (Signed)
Discharge to [facility], transported by [facility], Report called to [place], Therapist, sports.

## 2015-09-28 NOTE — Progress Notes (Signed)
Occupational Therapy Treatment Patient Details Name: Pamela Mejia MRN: [id number] DOB: 1926-07-06 Today's Date: 09/28/2015    History of present illness pt presents with Colostomy Take down and Parastomal Hernia Repair.  pt with hx of HTN, Ocular Melanoma with R eye blind, L Cataract, and Colostomy.     OT comments  Focus of session on toileting. Required min guard assist to pivot to 3 in 1 and min assist for pericare. Educated in log roll technique for bed mobility to minimize abdominal pain. Pt eager to d/c to [facility] rehab today.  Follow Up Recommendations  SNF;Supervision/Assistance - 24 hour    Equipment Recommendations  None recommended by OT    Recommendations for Other Services      Precautions / Restrictions Precautions Precautions: Fall Precaution Comments: Abdominal Incision with drains x 2       Mobility Bed Mobility Overal bed mobility: Needs Assistance Bed Mobility: Rolling;Sidelying to Sit;Sit to Sidelying Rolling: Modified independent (Device/Increase time);Supervision Sidelying to sit: Supervision     Sit to sidelying: Min assist General bed mobility comments: increased time, assist for LEs back into bed, encouraged log roll technique to minimize pain  Transfers Overall transfer level: Needs assistance   Transfers: Sit to/from Stand;Stand Pivot Transfers Sit to Stand: Min guard Stand pivot transfers: Min guard       General transfer comment: reliant on at least one UE for balance in standing    Balance     Sitting balance-Leahy Scale: Good       Standing balance-Leahy Scale: Poor                     ADL Overall ADL's : Needs assistance/impaired     Grooming: Wash/dry hands;Wash/dry face;Sitting;Set up                   Toilet Transfer: Min guard;Stand-pivot;BSC   Toileting- Clothing Manipulation and Hygiene: Minimal assistance;Sit to/from stand Toileting - Clothing Manipulation Details (indicate cue type and reason): assist  for thoroughness after BM       General ADL Comments: Pt discharging to SNF later today, declined bathing and dressing in anticipation of having abdominal dressing changed and drains removed.      Vision                     Perception     Praxis      Cognition   Behavior During Therapy: WFL for tasks assessed/performed Overall Cognitive Status: Within Functional Limits for tasks assessed                       Extremity/Trunk Assessment               Exercises     Shoulder Instructions       General Comments      Pertinent Vitals/ Pain       Pain Assessment: Faces Faces Pain Scale: Hurts little more Pain Location: abdomen Pain Descriptors / Indicators: Guarding;Grimacing Pain Intervention(s): Monitored during session;Repositioned  Home Living                                          Prior Functioning/Environment              Frequency Min 2X/week     Progress Toward Goals  OT Goals(current goals can now be found in  the care plan section)  Progress towards OT goals: Progressing toward goals  Acute Rehab OT Goals Patient Stated Goal: to [facility] for rehab  Plan Discharge plan remains appropriate    Co-evaluation                 End of Session     Activity Tolerance Patient tolerated treatment well   Patient Left in bed;with call bell/phone within reach   Nurse Communication          Time: 0832-0859 OT Time Calculation (min): 27 min  Charges: OT General Charges $OT Visit: 1 Procedure OT Treatments $Self Care/Home Management : 23-37 mins  Malka So 09/28/2015, 9:05 AM  [phone]

## 2015-09-28 NOTE — Clinical Social Work Placement (Signed)
   CLINICAL SOCIAL WORK PLACEMENT  NOTE  Date:  09/28/2015  Patient Details  Name: Pamela Mejia MRN: [id number] Date of Birth: [birth year]  Clinical Social Work is seeking post-discharge placement for this patient at the [place] level of care (*CSW will initial, date and re-position this form in  chart as items are completed):  Yes   Patient/family provided with [facility] Work Department's list of facilities offering this level of care within the geographic area requested by the patient (or if unable, by the patient's family).  Yes   Patient/family informed of their freedom to choose among providers that offer the needed level of care, that participate in Medicare, Medicaid or managed care program needed by the patient, have an available bed and are willing to accept the patient.  Yes   Patient/family informed of [facility] ownership interest in [facility] and [facility], as well as of the fact that they are under no obligation to receive care at these facilities.  PASRR submitted to EDS on       PASRR number received on       Existing PASRR number confirmed on 09/25/15     FL2 transmitted to all facilities in geographic area requested by pt/family on 09/25/15     FL2 transmitted to all facilities within larger geographic area on       Patient informed that his/her managed care company has contracts with or will negotiate with certain facilities, including the following:        Yes   Patient/family informed of bed offers received.  Patient chooses bed at [facility]     Physician recommends and patient chooses bed at      Patient to be transferred to [facility] on  .  Patient to be transferred to facility by ambulance     Patient family notified on 09/28/15 of transfer.  Name of family member notified:  Schild,Paul     PHYSICIAN Please prepare priority discharge summary, including medications,  Please prepare prescriptions, Please sign FL2     Additional Comment:  Per MD patient is ready to discharge [facility]. RN, patient, patient's family, and facility notified of discharge. RN given phone number for report and transport packet is on patient's chart. Ambulance transport requested. CSW signing off.   _______________________________________________ Samule Dry, LCSW 09/28/2015, 8:58 AM

## 2016-10-23 ENCOUNTER — Ambulatory Visit
Admission: RE | Admit: 2016-10-23 | Discharge: 2016-10-23 | Disposition: A | Discharge status: Home / Self Care | Payer: Medicare Other | Source: Ambulatory Visit | Attending: Internal Medicine | Admitting: Internal Medicine

## 2016-10-23 ENCOUNTER — Other Ambulatory Visit: Payer: Self-pay | Admitting: Internal Medicine

## 2016-10-23 DIAGNOSIS — R059 Cough, unspecified: Secondary | ICD-10-CM

## 2016-10-23 DIAGNOSIS — R05 Cough: Secondary | ICD-10-CM

## 2017-01-19 IMAGING — CR DG ABDOMEN ACUTE W/ 1V CHEST
5 series · 5 of 5 positions shown · non-contrast
Comparison: None.

CLINICAL DATA: Abdominal pain

EXAM:
DG ABDOMEN ACUTE W/ 1V CHEST

[w abdomen decub (1 of 2)]
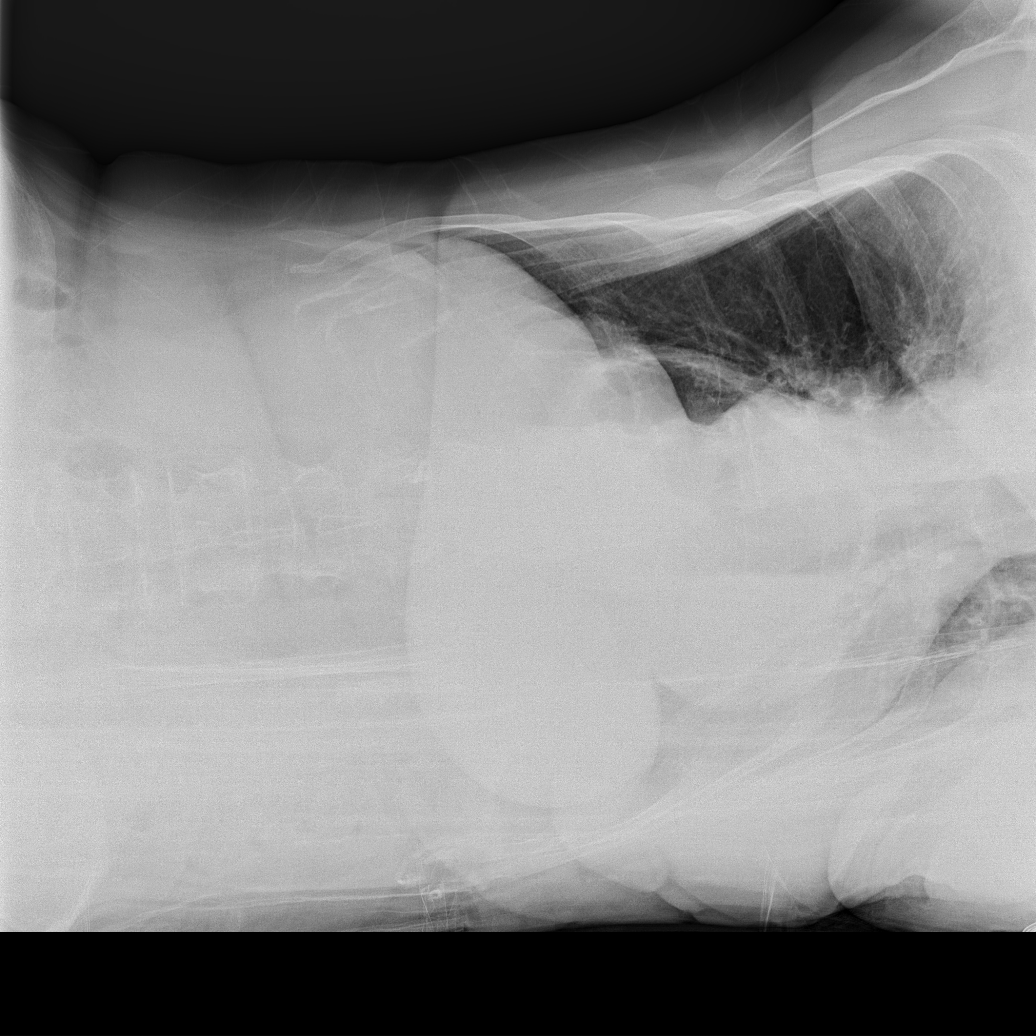

[w abdomen decub (2 of 2)]
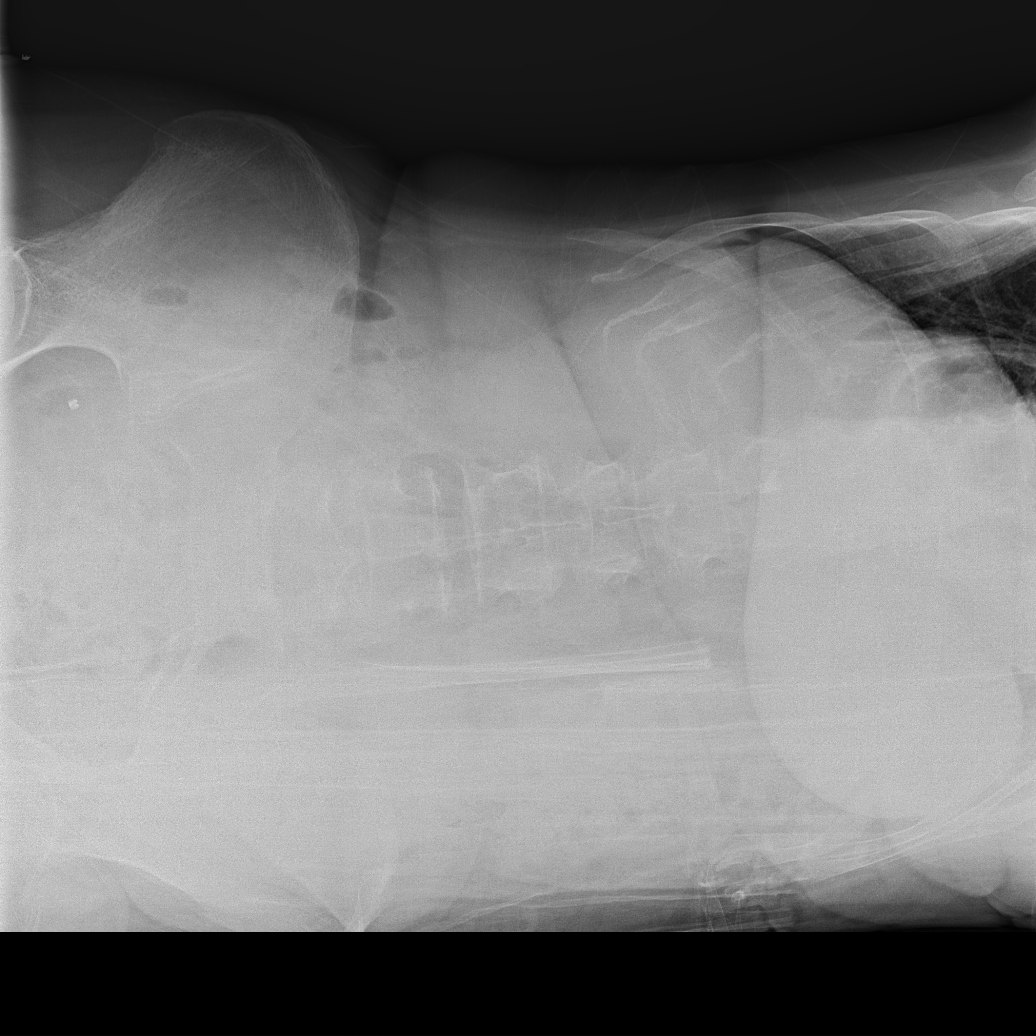

[x abdomen supine (1 of 2)]
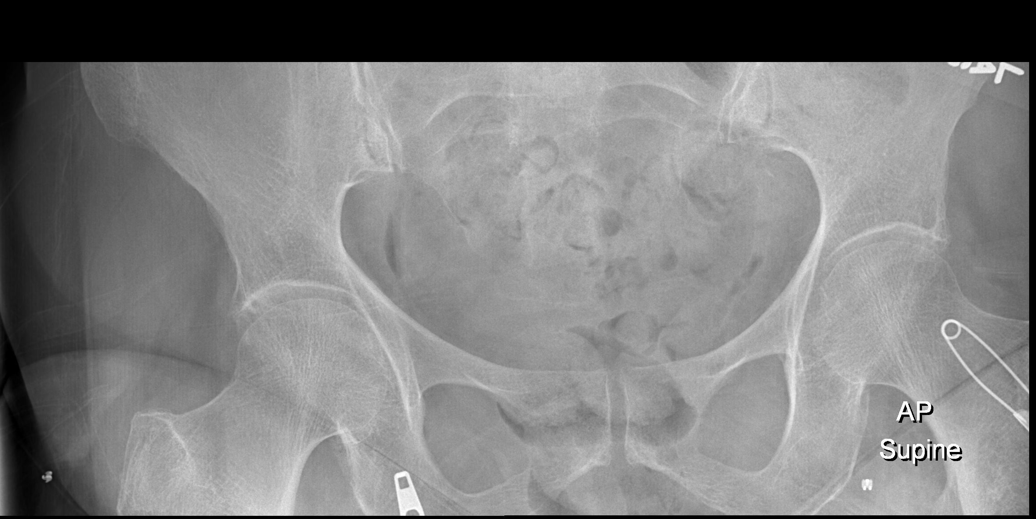

[x abdomen supine (2 of 2)]
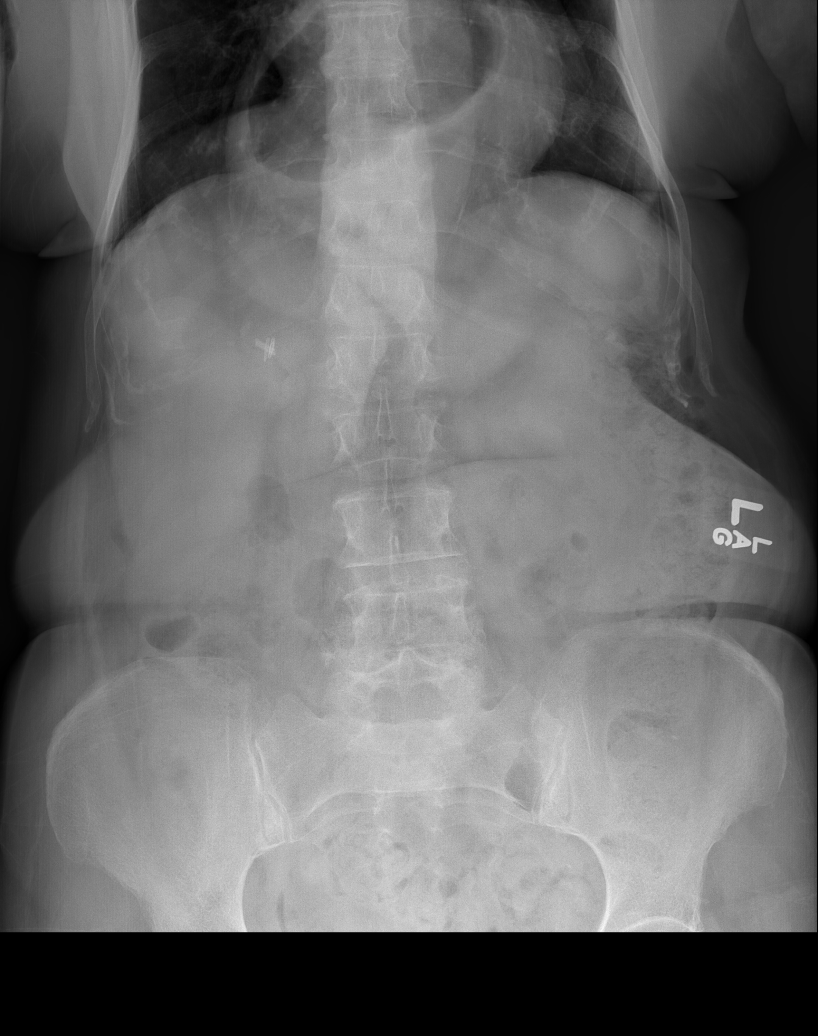

[x chest ap]
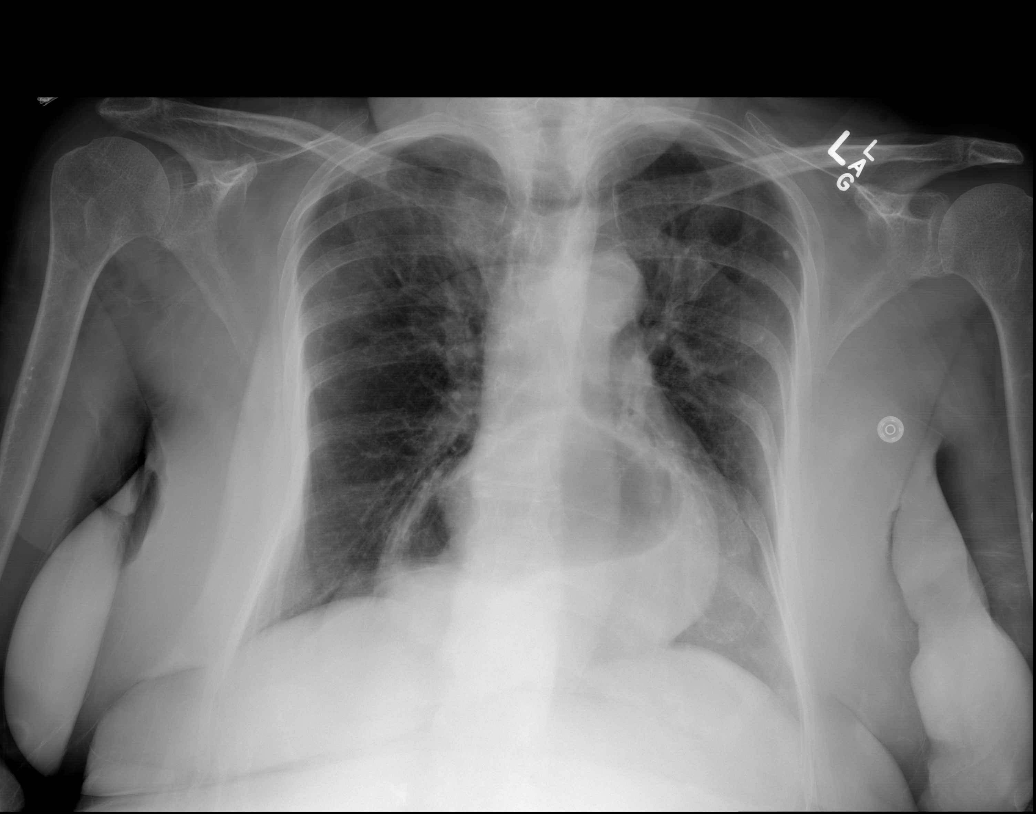

[5 of 5 positions shown; findings below may reference images not displayed]

FINDINGS: Borderline cardiomegaly. There is a large hiatal hernia with gas
fluid level. Negative aortic and hilar contours. There is no edema,
consolidation, effusion, or pneumothorax. Nonobstructive small bowel
and colonic gas pattern. Moderate stool burden with formed stool
from the splenic flexure to the rectum. No concerning
intra-abdominal mass effect or calcification. Cholecystectomy
changes. No pneumoperitoneum.
IMPRESSION: 1. Large hiatal hernia with fluid level.
2. No evidence of acute cardiopulmonary disease.
3. Moderate stool volume.

## 2018-05-14 ENCOUNTER — Other Ambulatory Visit: Payer: Self-pay | Admitting: Internal Medicine

## 2018-05-14 ENCOUNTER — Ambulatory Visit
Admission: RE | Admit: 2018-05-14 | Discharge: 2018-05-14 | Disposition: A | Discharge status: Home / Self Care | Payer: Medicare Other | Source: Ambulatory Visit | Attending: Internal Medicine | Admitting: Internal Medicine

## 2018-05-14 DIAGNOSIS — R059 Cough, unspecified: Secondary | ICD-10-CM

## 2018-05-14 DIAGNOSIS — R05 Cough: Secondary | ICD-10-CM

## 2018-09-09 ENCOUNTER — Other Ambulatory Visit: Payer: Self-pay | Admitting: Ophthalmology

## 2018-09-09 DIAGNOSIS — H5340 Unspecified visual field defects: Secondary | ICD-10-CM

## 2020-05-04 ENCOUNTER — Ambulatory Visit
Admission: RE | Admit: 2020-05-04 | Discharge: 2020-05-04 | Disposition: A | Discharge status: Home / Self Care | Payer: Medicare Other | Source: Ambulatory Visit | Attending: Internal Medicine | Admitting: Internal Medicine

## 2020-05-04 ENCOUNTER — Other Ambulatory Visit: Payer: Self-pay | Admitting: Internal Medicine

## 2020-05-04 DIAGNOSIS — R059 Cough, unspecified: Secondary | ICD-10-CM

## 2020-05-04 DIAGNOSIS — R0602 Shortness of breath: Secondary | ICD-10-CM

## 2020-06-09 ENCOUNTER — Other Ambulatory Visit: Payer: Self-pay

## 2020-06-09 ENCOUNTER — Ambulatory Visit
Admission: RE | Admit: 2020-06-09 | Discharge: 2020-06-09 | Disposition: A | Discharge status: Home / Self Care | Payer: Medicare Other | Source: Ambulatory Visit | Attending: Internal Medicine | Admitting: Internal Medicine

## 2020-06-09 ENCOUNTER — Other Ambulatory Visit: Payer: Self-pay | Admitting: Internal Medicine

## 2020-06-09 DIAGNOSIS — R0602 Shortness of breath: Secondary | ICD-10-CM

## 2020-06-09 DIAGNOSIS — R059 Cough, unspecified: Secondary | ICD-10-CM

## 2020-07-28 ENCOUNTER — Inpatient Hospital Stay (HOSPITAL_COMMUNITY)
Admission: EM | Admit: 2020-07-28 | Discharge: 2020-07-30 | DRG: 194 | Disposition: A | Discharge status: Home Health | Payer: Medicare Other | Attending: Internal Medicine | Admitting: Internal Medicine

## 2020-07-28 ENCOUNTER — Emergency Department (HOSPITAL_COMMUNITY): Payer: Medicare Other

## 2020-07-28 ENCOUNTER — Encounter (HOSPITAL_COMMUNITY): Payer: Self-pay

## 2020-07-28 ENCOUNTER — Other Ambulatory Visit: Payer: Self-pay

## 2020-07-28 DIAGNOSIS — N179 Acute kidney failure, unspecified: Secondary | ICD-10-CM

## 2020-07-28 DIAGNOSIS — Z20822 Contact with and (suspected) exposure to covid-19: Secondary | ICD-10-CM | POA: Diagnosis present

## 2020-07-28 DIAGNOSIS — J40 Bronchitis, not specified as acute or chronic: Secondary | ICD-10-CM | POA: Diagnosis not present

## 2020-07-28 DIAGNOSIS — Z85840 Personal history of malignant neoplasm of eye: Secondary | ICD-10-CM

## 2020-07-28 DIAGNOSIS — Z820 Family history of epilepsy and other diseases of the nervous system: Secondary | ICD-10-CM

## 2020-07-28 DIAGNOSIS — J189 Pneumonia, unspecified organism: Principal | ICD-10-CM | POA: Diagnosis present

## 2020-07-28 DIAGNOSIS — Z833 Family history of diabetes mellitus: Secondary | ICD-10-CM

## 2020-07-28 DIAGNOSIS — Z91012 Allergy to eggs: Secondary | ICD-10-CM

## 2020-07-28 DIAGNOSIS — K219 Gastro-esophageal reflux disease without esophagitis: Secondary | ICD-10-CM | POA: Diagnosis present

## 2020-07-28 DIAGNOSIS — Z8249 Family history of ischemic heart disease and other diseases of the circulatory system: Secondary | ICD-10-CM

## 2020-07-28 DIAGNOSIS — J45901 Unspecified asthma with (acute) exacerbation: Secondary | ICD-10-CM | POA: Diagnosis present

## 2020-07-28 DIAGNOSIS — Z880 Allergy status to penicillin: Secondary | ICD-10-CM

## 2020-07-28 DIAGNOSIS — Z79890 Hormone replacement therapy (postmenopausal): Secondary | ICD-10-CM

## 2020-07-28 DIAGNOSIS — E039 Hypothyroidism, unspecified: Secondary | ICD-10-CM | POA: Diagnosis present

## 2020-07-28 DIAGNOSIS — Z888 Allergy status to other drugs, medicaments and biological substances status: Secondary | ICD-10-CM

## 2020-07-28 DIAGNOSIS — I10 Essential (primary) hypertension: Secondary | ICD-10-CM | POA: Diagnosis present

## 2020-07-28 DIAGNOSIS — Z823 Family history of stroke: Secondary | ICD-10-CM

## 2020-07-28 DIAGNOSIS — Z79899 Other long term (current) drug therapy: Secondary | ICD-10-CM

## 2020-07-28 DIAGNOSIS — J4540 Moderate persistent asthma, uncomplicated: Secondary | ICD-10-CM | POA: Diagnosis present

## 2020-07-28 LAB — BASIC METABOLIC PANEL
Anion gap: 10 (ref 5–15)
BUN: 22 mg/dL (ref 8–23)
CO2: 22 mmol/L (ref 22–32)
Calcium: 10.3 mg/dL (ref 8.9–10.3)
Chloride: 110 mmol/L (ref 98–111)
Creatinine, Ser: 1.44 mg/dL — ABNORMAL HIGH (ref 0.44–1.00)
GFR, Estimated: 34 mL/min — ABNORMAL LOW (ref >60)
Glucose, Bld: 91 mg/dL (ref 70–99)
Potassium: 4.6 mmol/L (ref 3.5–5.1)
Sodium: 142 mmol/L (ref 135–145)

## 2020-07-28 LAB — CBC WITH DIFFERENTIAL/PLATELET
Abs Immature Granulocytes: 0.17 10*3/uL — ABNORMAL HIGH (ref 0.00–0.07)
Basophils Absolute: 0.1 10*3/uL (ref 0.0–0.1)
Basophils Relative: 1 %
Eosinophils Absolute: 0.1 10*3/uL (ref 0.0–0.5)
Eosinophils Relative: 1 %
HCT: 41.5 % (ref 36.0–46.0)
Hemoglobin: 13.2 g/dL (ref 12.0–15.0)
Immature Granulocytes: 2 %
Lymphocytes Relative: 15 %
Lymphs Abs: 1.2 10*3/uL (ref 0.7–4.0)
MCH: 29.8 pg (ref 26.0–34.0)
MCHC: 31.8 g/dL (ref 30.0–36.0)
MCV: 93.7 fL (ref 80.0–100.0)
Monocytes Absolute: 0.9 10*3/uL (ref 0.1–1.0)
Monocytes Relative: 11 %
Neutro Abs: 5.6 10*3/uL (ref 1.7–7.7)
Neutrophils Relative %: 70 %
Platelets: 250 10*3/uL (ref 150–400)
RBC: 4.43 MIL/uL (ref 3.87–5.11)
RDW: 12.2 % (ref 11.5–15.5)
WBC: 7.9 10*3/uL (ref 4.0–10.5)
nRBC: 0 % (ref 0.0–0.2)

## 2020-07-28 LAB — TROPONIN I (HIGH SENSITIVITY): Troponin I (High Sensitivity): 6 ng/L (ref <18)

## 2020-07-28 LAB — RESP PANEL BY RT-PCR (FLU A&B, COVID) ARPGX2
Influenza A by PCR: NEGATIVE
Influenza B by PCR: NEGATIVE
SARS Coronavirus 2 by RT PCR: NEGATIVE

## 2020-07-28 LAB — BRAIN NATRIURETIC PEPTIDE: B Natriuretic Peptide: 39.1 pg/mL (ref 0.0–100.0)

## 2020-07-28 MED ORDER — LEVOFLOXACIN IN D5W 750 MG/150ML IV SOLN
750.0000 mg | Freq: Once | INTRAVENOUS | Status: DC
  Administered 2020-07-28: 750 mg via INTRAVENOUS
  Filled 2020-07-28: qty 150

## 2020-07-28 MED ORDER — SODIUM CHLORIDE 0.9 % IV SOLN
INTRAVENOUS | Status: DC
Start: 2020-07-28 — End: 2020-07-29

## 2020-07-28 MED ORDER — GUAIFENESIN ER 600 MG PO TB12
600.0000 mg | ORAL_TABLET | Freq: Two times a day (BID) | ORAL | Status: DC
  Administered 2020-07-28 – 2020-07-29 (×2): 600 mg via ORAL
  Filled 2020-07-28 (×2): qty 1

## 2020-07-28 NOTE — ED Triage Notes (Signed)
Pt presents to the ED via EMS for SOB. Dx'd with bronchitis last one week ago and has been taking an abx for this x1 week without improvement.

## 2020-07-28 NOTE — ED Provider Notes (Signed)
[facility] DEPT Provider Note   CSN: [id number] Arrival date & time: 07/28/20  1813     History Chief Complaint  Patient presents with  . Shortness of Breath  - Cough  Pamela Mejia is a 87 y.o. female here with cough with history of bronchitis present emergency department chronic cough for 3 months.  She reports a stop and able to shake this cough.  She has been seen by her PCP and gone through 2 entire courses of antibiotics, and is currently being treated with a third course of antibiotics.  She is here with her son at the bedside.  She denies fevers or chills.  She reports she intermittently has productive cough.  Her cough is not better or worse at night or lying down or with exertion.  It is persistent all the time.  She is not taking any over-the-counter medications for her cough.  She has significant side effects and allergies to many medications, and prefers not to take any new pills.  She lives at a nursing home.  She denies any present or past history of smoking.  She does report a history of asthma and states that she uses an inhaler daily.  She has not had steroids recently per her recollection.  HPI     Past Medical History:  Diagnosis Date  . Anemia   . Arthritis   . Asthma   . Blind right eye   . Blood clotting disorder ([place])   . Cancer ([place])    Ocular melanoma  . Cataract    left  . Complication of anesthesia    has a soy allergy  . Diverticulitis   . Diverticulosis   . Gallstones   . GERD (gastroesophageal reflux disease)   . Hypertension   . Hypothyroidism   . Inguinal hernia    left  . Kidney stones   . Status post dilation of esophageal narrowing     Patient Active Problem List   Diagnosis Date Noted  . CAP (community acquired pneumonia) 07/29/2020  . AKI (acute kidney injury) ([place]) 07/29/2020  . History of diverticulitis 09/23/2015  . Asthma, moderate persistent 02/03/2015  . Hypothyroidism 02/03/2015  .  Essential hypertension 02/03/2015  . Peritonitis ([place]) 02/03/2015    Past Surgical History:  Procedure Laterality Date  . ABDOMINAL HYSTERECTOMY  1966  . BLADDER SURGERY  2008  . BREAST BIOPSY Bilateral   . BUNIONECTOMY Right   . CHOLECYSTECTOMY  1996      . COLONOSCOPY    . COLOSTOMY  01/2015  . COLOSTOMY TAKEDOWN  09/23/2015  . COLOSTOMY TAKEDOWN N/A 09/23/2015   Procedure: COLOSTOMY TAKEDOWN;  Surgeon: Stark Klein, MD;  Location: [facility];  Service: General;  Laterality: N/A;  . DIAGNOSTIC LAPAROSCOPY  01/2015   sigmoid colectomy with takedown of splenic flexure and end colostomy   . EYE SURGERY  1994   radioactive plaque  . HERNIA REPAIR  09/23/2015   PARASTOMAL   . LAPAROSCOPY N/A 02/03/2015   Procedure: DIAGNOSTIC LAPAROSCOPY/OPEN SIGMOID COLECTOMY WITH TAKE DOWN OF SPLENIC FLEXURE AND END COLOSTOMY;  Surgeon: Stark Klein, MD;  Location: [facility];  Service: General;  Laterality: N/A;  . PARASTOMAL HERNIA REPAIR N/A 09/23/2015   Procedure: HERNIA REPAIR PARASTOMAL;  Surgeon: Stark Klein, MD;  Location: [facility];  Service: General;  Laterality: N/A;  . throat biopsy     "had stones in my throat; had them in alot of places"  . TRIGGER FINGER RELEASE  OB History   No obstetric history on file.     Family History  Problem Relation Age of Onset  . Hypertension Mother   . Alzheimer's disease Mother   . Stroke Father   . Stroke Daughter   . Diabetes Son   . Colon cancer Neg Hx     Social History   Tobacco Use  . Smoking status: Never Smoker  . Smokeless tobacco: Never Used  Substance Use Topics  . Alcohol use: No    Alcohol/week: 0.0 standard drinks  . Drug use: No    Home Medications Prior to Admission medications   Medication Sig Start Date End Date Taking? Authorizing Provider  amLODipine (NORVASC) 5 MG tablet Take 5 mg by mouth daily.   Yes [provider]  Fluticasone-Salmeterol (ADVAIR) 100-50 MCG/DOSE AEPB Inhale 1 puff into the lungs 2 (two)  times daily.   Yes [provider]  HYDROcodone-acetaminophen (NORCO/VICODIN) 5-325 MG tablet Take 1-2 tablets by mouth every 4 (four) hours as needed for moderate pain. 09/27/15  Yes Stark Klein, MD  levofloxacin (LEVAQUIN) 750 MG tablet Take 750 mg by mouth daily. Start date : 07/26/20   Yes [provider]  levothyroxine (SYNTHROID) 50 MCG tablet Take 50 mcg by mouth daily before breakfast.   Yes [provider]  losartan (COZAAR) 100 MG tablet Take 100 mg by mouth daily.   Yes [provider]  Multiple Vitamins-Minerals (PRESERVISION AREDS PO) Take 1 tablet by mouth daily.   Yes [provider]  omeprazole (PRILOSEC) 40 MG capsule Take 40 mg by mouth daily.   Yes [provider]  simvastatin (ZOCOR) 20 MG tablet Take 20 mg by mouth daily.   Yes [provider]  methocarbamol (ROBAXIN) 500 MG tablet Take 1 tablet (500 mg total) by mouth every 6 (six) hours as needed for muscle spasms. Patient not taking: No sig reported 09/28/15   Jill Alexanders, PA-C    Allergies    Eggs or egg-derived products, Penicillins, Soy allergy, and Tylenol with codeine #3 [acetaminophen-codeine]  Review of Systems   Review of Systems  Constitutional: Negative for chills and fever.  HENT: Negative for ear pain and sore throat.   Eyes: Negative for pain and visual disturbance.  Respiratory: Positive for cough and shortness of breath.   Cardiovascular: Negative for chest pain and palpitations.  Gastrointestinal: Negative for abdominal pain, nausea and vomiting.  Musculoskeletal: Negative for arthralgias and myalgias.  Skin: Negative for color change and rash.  Neurological: Negative for syncope and light-headedness.  All other systems reviewed and are negative.   Physical Exam Updated Vital Signs BP (!) 159/80 (BP Location: Right Arm)   Pulse (!) 59   Temp 98.5 F (36.9 C) (Oral)   Resp 16   Ht 5' (1.524 m)   Wt 53.8 kg   SpO2 94%    BMI 23.16 kg/m   Physical Exam Constitutional:      General: She is not in acute distress.    Comments: Thin, frail, coughing  HENT:     Head: Normocephalic and atraumatic.  Eyes:     Conjunctiva/sclera: Conjunctivae normal.     Pupils: Pupils are equal, round, and reactive to light.  Cardiovascular:     Rate and Rhythm: Normal rate and regular rhythm.  Pulmonary:     Effort: Pulmonary effort is normal. No respiratory distress.     Breath sounds: Rhonchi present.  Abdominal:     General: There is no distension.  Tenderness: There is no abdominal tenderness.  Skin:    General: Skin is warm and dry.  Neurological:     General: No focal deficit present.     Mental Status: She is alert and oriented to person, place, and time. Mental status is at baseline.  Psychiatric:        Mood and Affect: Mood normal.        Behavior: Behavior normal.     ED Results / Procedures / Treatments   Labs (all labs ordered are listed, but only abnormal results are displayed) Labs Reviewed  CBC WITH DIFFERENTIAL/PLATELET - Abnormal; Notable for the following components:      Result Value   Abs Immature Granulocytes 0.17 (*)    All other components within normal limits  BASIC METABOLIC PANEL - Abnormal; Notable for the following components:   Creatinine, Ser 1.44 (*)    GFR, Estimated 34 (*)    All other components within normal limits  BASIC METABOLIC PANEL - Abnormal; Notable for the following components:   CO2 18 (*)    Creatinine, Ser 1.26 (*)    GFR, Estimated 40 (*)    All other components within normal limits  CBC - Abnormal; Notable for the following components:   RBC 3.83 (*)    Hemoglobin 11.6 (*)    HCT 35.3 (*)    All other components within normal limits  RESP PANEL BY RT-PCR (FLU A&B, COVID) ARPGX2  BRAIN NATRIURETIC PEPTIDE  TROPONIN I (HIGH SENSITIVITY)    EKG None  Radiology CT Chest Wo Contrast  Result Date: 07/28/2020 CLINICAL DATA:  Persistent cough. EXAM:  CT CHEST WITHOUT CONTRAST TECHNIQUE: Multidetector CT imaging of the chest was performed following the standard protocol without IV contrast. COMPARISON:  None. FINDINGS: Cardiovascular: There is moderate severity calcification of the aortic arch without evidence of aortic aneurysm. Normal heart size with moderate severity coronary artery calcification. A small pericardial effusion is seen. Mediastinum/Nodes: A 2.0 cm x 1.1 cm subcarinal lymph node is seen. Thyroid gland, trachea, and esophagus demonstrate no significant findings. Lungs/Pleura: The lungs are hyperinflated. Mild patchy infiltrate is seen within the posterior aspects of the mid right lung and right lung base. Very mild linear atelectasis is seen within the anterior aspect of the bilateral lower lobes. There is no evidence of a pleural effusion or pneumothorax. Upper Abdomen: There is a large gastric hernia with associated herniated mesenteric fat. Surgical clips are seen within the gallbladder fossa Musculoskeletal: Degenerative changes are seen throughout the thoracic spine. IMPRESSION: 1. Mild posterior right lower lobe infiltrate with very mild bilateral lower lobe linear atelectasis. 2. Large gastric hernia. 3. Evidence of prior cholecystectomy. Electronically Signed   By: Virgina Norfolk M.D.   On: 07/28/2020 22:12   DG Chest Port 1 View  Result Date: 07/28/2020 CLINICAL DATA:  Shortness of breath. EXAM: PORTABLE CHEST 1 VIEW COMPARISON:  PA and lateral chest 06/09/2020. FINDINGS: Lungs clear. Heart size normal. Large hiatal hernia. No pneumothorax or pleural fluid. No acute bony abnormality. IMPRESSION: No acute disease. Large hiatal hernia. Electronically Signed   By: Inge Rise M.D.   On: 07/28/2020 19:57    Procedures Procedures   Medications Ordered in ED Medications  guaiFENesin (MUCINEX) 12 hr tablet 600 mg (600 mg Oral Given 07/29/20 0920)  HYDROcodone-acetaminophen (NORCO/VICODIN) 5-325 MG per tablet 1-2 tablet (has no  administration in time range)  amLODipine (NORVASC) tablet 5 mg (5 mg Oral Given 07/29/20 0920)  simvastatin (ZOCOR) tablet 20 mg (  20 mg Oral Given 07/29/20 0920)  levothyroxine (SYNTHROID) tablet 50 mcg (50 mcg Oral Given 07/29/20 0529)  mometasone-formoterol (DULERA) 100-5 MCG/ACT inhaler 2 puff (2 puffs Inhalation Given 07/29/20 0854)  pantoprazole (PROTONIX) EC tablet 40 mg (40 mg Oral Given 07/29/20 0920)  enoxaparin (LOVENOX) injection 30 mg (30 mg Subcutaneous Given 07/29/20 0920)  lactated ringers infusion ( Intravenous New Bag/Given 07/29/20 0302)  albuterol (PROVENTIL) (2.5 MG/3ML) 0.083% nebulizer solution 2.5 mg (has no administration in time range)  doxycycline (VIBRA-TABS) tablet 100 mg (100 mg Oral Given 07/29/20 0921)  hydrALAZINE (APRESOLINE) tablet 50 mg (50 mg Oral Given 07/29/20 7408)    ED Course  I have reviewed the triage vital signs and the nursing notes.  Pertinent labs & imaging results that were available during my care of the patient were reviewed by me and considered in my medical decision making (see chart for details).  This patient complains of persistent new cough x  3 months .  This involves an extensive number of treatment options, and is a complaint that carries with it a high risk of complications and morbidity.  The differential diagnosis includes PNA vs CHF/pulm edema vs pleural effusion vs bronchitis vs other  I ordered, reviewed, and interpreted labs.  No life-threatening abnormalities were noted on these tests.  BNP 39, trop 6 - less likely an acute cardiac issue. I ordered medication mucinex for cough.  Initially ordered levaquin for PNA, but discontinued prior to infusion after discussion with hospitalist attending, who felt the patient was quite high risk for adverse effects from this antibiotic, and would prefer to evaluate pt prior to more antibiotics.  I agreed with this plan. I ordered imaging studies which included dg chest, CT chest I independently visualized  and interpreted imaging which showed multiple opacities in the lower lobes, nonspecific, and the monitor tracing which showed NSR.  Telemetry noting "V Tach" but per my clinical exam these are likely motion artifact from coughing and movement. Additional history was obtained from patient's son at bedside   Clinical Course as of 07/29/20 1108  Wed Jul 28, 2020  2225 1. Mild posterior right lower lobe infiltrate with very mild bilateral lower lobe linear atelectasis. 2. Large gastric hernia. 3. Evidence of prior cholecystectomy. [MT]  2247 CT findings with possible multifocal infiltrates.  The patient denies a productive cough.  I discussed with the hospitalist, who advised holding off on IV antibiotics always come to evaluate the patient. [MT]  2248 Doubt sepsis at this time. [MT]    Clinical Course User Index [MT] Michole Lecuyer, Carola Rhine, MD    Final Clinical Impression(s) / ED Diagnoses Final diagnoses:  Bronchitis    Rx / DC Orders ED Discharge Orders         Ordered    Ambulatory referral to Pulmonology       Comments: Cough x 3 months, questionable infiltrate on CT chest, unclear if pneumonia or chronic condition   07/28/20 2241           Wyvonnia Dusky, MD 07/29/20 1108

## 2020-07-29 ENCOUNTER — Encounter (HOSPITAL_COMMUNITY): Payer: Self-pay | Admitting: Family Medicine

## 2020-07-29 DIAGNOSIS — Z91012 Allergy to eggs: Secondary | ICD-10-CM | POA: Diagnosis not present

## 2020-07-29 DIAGNOSIS — K219 Gastro-esophageal reflux disease without esophagitis: Secondary | ICD-10-CM | POA: Diagnosis present

## 2020-07-29 DIAGNOSIS — Z820 Family history of epilepsy and other diseases of the nervous system: Secondary | ICD-10-CM | POA: Diagnosis not present

## 2020-07-29 DIAGNOSIS — Z79899 Other long term (current) drug therapy: Secondary | ICD-10-CM | POA: Diagnosis not present

## 2020-07-29 DIAGNOSIS — Z888 Allergy status to other drugs, medicaments and biological substances status: Secondary | ICD-10-CM | POA: Diagnosis not present

## 2020-07-29 DIAGNOSIS — J40 Bronchitis, not specified as acute or chronic: Secondary | ICD-10-CM | POA: Diagnosis present

## 2020-07-29 DIAGNOSIS — Z79890 Hormone replacement therapy (postmenopausal): Secondary | ICD-10-CM | POA: Diagnosis not present

## 2020-07-29 DIAGNOSIS — I10 Essential (primary) hypertension: Secondary | ICD-10-CM | POA: Diagnosis present

## 2020-07-29 DIAGNOSIS — N179 Acute kidney failure, unspecified: Secondary | ICD-10-CM

## 2020-07-29 DIAGNOSIS — J4540 Moderate persistent asthma, uncomplicated: Secondary | ICD-10-CM | POA: Diagnosis present

## 2020-07-29 DIAGNOSIS — J45901 Unspecified asthma with (acute) exacerbation: Secondary | ICD-10-CM | POA: Diagnosis present

## 2020-07-29 DIAGNOSIS — J189 Pneumonia, unspecified organism: Secondary | ICD-10-CM | POA: Diagnosis not present

## 2020-07-29 DIAGNOSIS — Z20822 Contact with and (suspected) exposure to covid-19: Secondary | ICD-10-CM | POA: Diagnosis present

## 2020-07-29 DIAGNOSIS — Z823 Family history of stroke: Secondary | ICD-10-CM | POA: Diagnosis not present

## 2020-07-29 DIAGNOSIS — Z833 Family history of diabetes mellitus: Secondary | ICD-10-CM | POA: Diagnosis not present

## 2020-07-29 DIAGNOSIS — Z85840 Personal history of malignant neoplasm of eye: Secondary | ICD-10-CM | POA: Diagnosis not present

## 2020-07-29 DIAGNOSIS — Z8249 Family history of ischemic heart disease and other diseases of the circulatory system: Secondary | ICD-10-CM | POA: Diagnosis not present

## 2020-07-29 DIAGNOSIS — E039 Hypothyroidism, unspecified: Secondary | ICD-10-CM | POA: Diagnosis present

## 2020-07-29 DIAGNOSIS — Z880 Allergy status to penicillin: Secondary | ICD-10-CM | POA: Diagnosis not present

## 2020-07-29 LAB — BASIC METABOLIC PANEL
Anion gap: 11 (ref 5–15)
BUN: 21 mg/dL (ref 8–23)
CO2: 18 mmol/L — ABNORMAL LOW (ref 22–32)
Calcium: 9.1 mg/dL (ref 8.9–10.3)
Chloride: 111 mmol/L (ref 98–111)
Creatinine, Ser: 1.26 mg/dL — ABNORMAL HIGH (ref 0.44–1.00)
GFR, Estimated: 40 mL/min — ABNORMAL LOW (ref >60)
Glucose, Bld: 84 mg/dL (ref 70–99)
Potassium: 4.4 mmol/L (ref 3.5–5.1)
Sodium: 140 mmol/L (ref 135–145)

## 2020-07-29 LAB — CBC
HCT: 35.3 % — ABNORMAL LOW (ref 36.0–46.0)
Hemoglobin: 11.6 g/dL — ABNORMAL LOW (ref 12.0–15.0)
MCH: 30.3 pg (ref 26.0–34.0)
MCHC: 32.9 g/dL (ref 30.0–36.0)
MCV: 92.2 fL (ref 80.0–100.0)
Platelets: 187 10*3/uL (ref 150–400)
RBC: 3.83 MIL/uL — ABNORMAL LOW (ref 3.87–5.11)
RDW: 12.2 % (ref 11.5–15.5)
WBC: 8.2 10*3/uL (ref 4.0–10.5)
nRBC: 0 % (ref 0.0–0.2)

## 2020-07-29 MED ORDER — PANTOPRAZOLE SODIUM 40 MG PO TBEC
40.0000 mg | DELAYED_RELEASE_TABLET | Freq: Every day | ORAL | Status: DC
  Administered 2020-07-29 – 2020-07-30 (×2): 40 mg via ORAL
  Filled 2020-07-29 (×2): qty 1

## 2020-07-29 MED ORDER — LACTATED RINGERS IV SOLN: INTRAVENOUS | Status: DC

## 2020-07-29 MED ORDER — HYDROCODONE-ACETAMINOPHEN 5-325 MG PO TABS: 1.0000 | ORAL_TABLET | ORAL | Status: DC | PRN

## 2020-07-29 MED ORDER — ALBUTEROL SULFATE (2.5 MG/3ML) 0.083% IN NEBU: 2.5000 mg | INHALATION_SOLUTION | Freq: Four times a day (QID) | RESPIRATORY_TRACT | Status: DC | PRN

## 2020-07-29 MED ORDER — HYDRALAZINE HCL 50 MG PO TABS
50.0000 mg | ORAL_TABLET | Freq: Three times a day (TID) | ORAL | Status: DC
  Administered 2020-07-29 – 2020-07-30 (×4): 50 mg via ORAL
  Filled 2020-07-29 (×4): qty 1

## 2020-07-29 MED ORDER — SIMVASTATIN 20 MG PO TABS
20.0000 mg | ORAL_TABLET | Freq: Every day | ORAL | Status: DC
  Administered 2020-07-29 – 2020-07-30 (×2): 20 mg via ORAL
  Filled 2020-07-29 (×2): qty 1

## 2020-07-29 MED ORDER — LIP MEDEX EX OINT
TOPICAL_OINTMENT | CUTANEOUS | Status: AC
  Filled 2020-07-29: qty 7

## 2020-07-29 MED ORDER — LOSARTAN POTASSIUM 50 MG PO TABS: 100.0000 mg | ORAL_TABLET | Freq: Every day | ORAL | Status: DC

## 2020-07-29 MED ORDER — DOXYCYCLINE HYCLATE 100 MG PO TABS
100.0000 mg | ORAL_TABLET | Freq: Two times a day (BID) | ORAL | Status: DC
  Administered 2020-07-29 – 2020-07-30 (×3): 100 mg via ORAL
  Filled 2020-07-29 (×3): qty 1

## 2020-07-29 MED ORDER — GUAIFENESIN 100 MG/5ML PO SOLN
10.0000 mL | Freq: Four times a day (QID) | ORAL | Status: DC
  Administered 2020-07-29: 200 mg via ORAL
  Filled 2020-07-29: qty 20
  Filled 2020-07-29 (×2): qty 10
  Filled 2020-07-29: qty 20

## 2020-07-29 MED ORDER — BENZONATATE 100 MG PO CAPS
100.0000 mg | ORAL_CAPSULE | Freq: Three times a day (TID) | ORAL | Status: DC
  Administered 2020-07-29 – 2020-07-30 (×2): 100 mg via ORAL
  Filled 2020-07-29 (×2): qty 1

## 2020-07-29 MED ORDER — AMLODIPINE BESYLATE 5 MG PO TABS
5.0000 mg | ORAL_TABLET | Freq: Every day | ORAL | Status: DC
  Administered 2020-07-29 – 2020-07-30 (×2): 5 mg via ORAL
  Filled 2020-07-29 (×2): qty 1

## 2020-07-29 MED ORDER — LEVOTHYROXINE SODIUM 50 MCG PO TABS
50.0000 ug | ORAL_TABLET | Freq: Every day | ORAL | Status: DC
  Administered 2020-07-29 – 2020-07-30 (×2): 50 ug via ORAL
  Filled 2020-07-29 (×2): qty 1

## 2020-07-29 MED ORDER — ONDANSETRON HCL 4 MG/2ML IJ SOLN
4.0000 mg | Freq: Four times a day (QID) | INTRAMUSCULAR | Status: DC | PRN
  Administered 2020-07-30: 4 mg via INTRAVENOUS
  Filled 2020-07-29: qty 2

## 2020-07-29 MED ORDER — SODIUM CHLORIDE 0.9 % IV SOLN
100.0000 mg | Freq: Two times a day (BID) | INTRAVENOUS | Status: DC
  Administered 2020-07-29: 100 mg via INTRAVENOUS
  Filled 2020-07-29: qty 100

## 2020-07-29 MED ORDER — MOMETASONE FURO-FORMOTEROL FUM 100-5 MCG/ACT IN AERO
2.0000 | INHALATION_SPRAY | Freq: Two times a day (BID) | RESPIRATORY_TRACT | Status: DC
  Administered 2020-07-29 – 2020-07-30 (×3): 2 via RESPIRATORY_TRACT
  Filled 2020-07-29: qty 8.8

## 2020-07-29 MED ORDER — LIP MEDEX EX OINT: TOPICAL_OINTMENT | CUTANEOUS | Status: DC | PRN

## 2020-07-29 MED ORDER — ENOXAPARIN SODIUM 30 MG/0.3ML IJ SOSY
30.0000 mg | PREFILLED_SYRINGE | INTRAMUSCULAR | Status: DC
  Administered 2020-07-29 – 2020-07-30 (×2): 30 mg via SUBCUTANEOUS
  Filled 2020-07-29 (×2): qty 0.3

## 2020-07-29 MED ORDER — METHYLPREDNISOLONE SODIUM SUCC 40 MG IJ SOLR
40.0000 mg | Freq: Two times a day (BID) | INTRAMUSCULAR | Status: DC
  Administered 2020-07-29 – 2020-07-30 (×3): 40 mg via INTRAVENOUS
  Filled 2020-07-29 (×3): qty 1

## 2020-07-29 NOTE — Progress Notes (Signed)
[facility] OF CARE NOTE Patient: Pamela Mejia [id number]   PCP: Lorene Dy, MD DOB: 01/20/1927   DOA: 07/28/2020   DOS: 07/29/2020    Patient was admitted by my colleague earlier on 07/29/2020. I have reviewed the H&P as well as assessment and plan and agree with the same. Important changes in the plan are listed below.  Plan of care: Principal Problem:   CAP (community acquired pneumonia) Active Problems:   Asthma, moderate persistent   Hypothyroidism   Essential hypertension   AKI (acute kidney injury) ([place] [facility]) Appears to have bilateral expiratory wheezing.  Concerning for respiratory bronchiolitis. Without steroids.  Continue antibiotics with p.o.  Monitor.  Author: Berle Mull, MD [facility] 07/29/2020 7:10 PM   If 7PM-7AM, please contact night-coverage at [email or web address]

## 2020-07-29 NOTE — Progress Notes (Signed)
Pt knows and understands how to use flutter valve.

## 2020-07-29 NOTE — H&P (Signed)
History and Physical    Kaelea Gathright ZCH:[id number] DOB: April 19, 1926 DOA: 07/28/2020  PCP: Lorene Dy, MD   Patient coming from: Home  Chief Complaint: SOB, cough  HPI: Pamela Mejia is a 87 y.o. female with medical history significant for asthma, HTN, hx of blood clots, GERD, hypothyroidism who presents for valuation of shortness of breath and cough.  She reports she has had a chronic cough for the last 3 months.  She states she has been on multiple antibiotics as an outpatient but continues to get worse.  She states she was seen by her PCP 2 days ago and was placed on Levaquin and she is taken 2 doses of this.  She states she has not had any fever but has had intermittent chills.  She states the cough is nonproductive.  She has not taken any over-the-counter cough medications.  She does have a history of asthma has been using her Advair as prescribed.  She has not noted any relieving or aggravating factors for her cough.  She denies any recent travel or prolonged immobilization.  She said she has not had any change in her diet and has not had any nausea or vomiting, abdominal pain or urinary symptoms.  ED Course: In the emergency room patient is remained hemodynamically stable.  CT of her chest was obtained which showed some bilateral diffuse infiltrates.  She has been afebrile and has a normal WBC. Hospitalist service been asked to admit for further management  Review of Systems:  General: Denies fever, chills, weight loss, night sweats.  Denies dizziness.  Denies change in appetite HENT: Denies head trauma, headache, denies change in hearing, tinnitus.  Denies nasal congestion or bleeding.  Denies sore throat, sores in mouth.  Denies difficulty swallowing Eyes: Denies blurry vision, pain in eye, drainage.  Denies discoloration of eyes. Neck: Denies pain.  Denies swelling.  Denies pain with movement. Cardiovascular: Denies chest pain, palpitations.  Denies edema.  Denies  orthopnea Respiratory: Reports shortness of breath and dry cough. Reports intermittent wheezing.  Denies sputum production Gastrointestinal: Denies abdominal pain, swelling.  Denies nausea, vomiting, diarrhea.  Denies melena.  Denies hematemesis. Musculoskeletal: Denies limitation of movement.  Denies deformity or swelling.  Denies pain.  Denies arthralgias or myalgias. Genitourinary: Denies pelvic pain.  Denies urinary frequency or hesitancy.  Denies dysuria.  Skin: Denies rash.  Denies petechiae, purpura, ecchymosis. Neurological: Denies headache.  Denies syncope.  Denies seizure activity.  Denies paresthesia.  Denies slurred speech, drooping face.  Denies visual change. Psychiatric: Denies depression, anxiety. Denies hallucinations.  Past Medical History:  Diagnosis Date  . Anemia   . Arthritis   . Asthma   . Blind right eye   . Blood clotting disorder ([place])   . Cancer ([place])    Ocular melanoma  . Cataract    left  . Complication of anesthesia    has a soy allergy  . Diverticulitis   . Diverticulosis   . Gallstones   . GERD (gastroesophageal reflux disease)   . Hypertension   . Hypothyroidism   . Inguinal hernia    left  . Kidney stones   . Status post dilation of esophageal narrowing     Past Surgical History:  Procedure Laterality Date  . ABDOMINAL HYSTERECTOMY  1966  . BLADDER SURGERY  2008  . BREAST BIOPSY Bilateral   . BUNIONECTOMY Right   . CHOLECYSTECTOMY  1996      . COLONOSCOPY    . COLOSTOMY  01/2015  .  COLOSTOMY TAKEDOWN  09/23/2015  . COLOSTOMY TAKEDOWN N/A 09/23/2015   Procedure: COLOSTOMY TAKEDOWN;  Surgeon: Stark Klein, MD;  Location: [facility];  Service: General;  Laterality: N/A;  . DIAGNOSTIC LAPAROSCOPY  01/2015   sigmoid colectomy with takedown of splenic flexure and end colostomy   . EYE SURGERY  1994   radioactive plaque  . HERNIA REPAIR  09/23/2015   PARASTOMAL   . LAPAROSCOPY N/A 02/03/2015   Procedure: DIAGNOSTIC LAPAROSCOPY/OPEN SIGMOID  COLECTOMY WITH TAKE DOWN OF SPLENIC FLEXURE AND END COLOSTOMY;  Surgeon: Stark Klein, MD;  Location: [facility];  Service: General;  Laterality: N/A;  . PARASTOMAL HERNIA REPAIR N/A 09/23/2015   Procedure: HERNIA REPAIR PARASTOMAL;  Surgeon: Stark Klein, MD;  Location: [facility];  Service: General;  Laterality: N/A;  . throat biopsy     "had stones in my throat; had them in alot of places"  . TRIGGER FINGER RELEASE      Social History  reports that she has never smoked. She has never used smokeless tobacco. She reports that she does not drink alcohol and does not use drugs.  Allergies  Allergen Reactions  . Eggs Or Egg-Derived Products Shortness Of Breath  . Penicillins Shortness Of Breath and Rash    Patient is unable to answer PCN questions at this time. Has patient had a PCN reaction causing immediate rash, facial/tongue/throat swelling, SOB or lightheadedness with hypotension: Yes Has patient had a PCN reaction causing severe rash involving mucus membranes or skin necrosis: No Has patient had a PCN reaction that required hospitalization Yes Has patient had a PCN reaction occurring within the last 10 years: No If all of the above answers are "NO", then may proceed with Cephalosporin use.    . Soy Allergy Hives  . Tylenol With Codeine #3 [Acetaminophen-Codeine]     hyper    Family History  Problem Relation Age of Onset  . Hypertension Mother   . Alzheimer's disease Mother   . Stroke Father   . Stroke Daughter   . Diabetes Son   . Colon cancer Neg Hx      Prior to Admission medications   Medication Sig Start Date End Date Taking? Authorizing Provider  amLODipine (NORVASC) 5 MG tablet Take 5 mg by mouth daily.   Yes [provider]  Fluticasone-Salmeterol (ADVAIR) 100-50 MCG/DOSE AEPB Inhale 1 puff into the lungs 2 (two) times daily.   Yes [provider]  HYDROcodone-acetaminophen (NORCO/VICODIN) 5-325 MG tablet Take 1-2 tablets by mouth every 4 (four) hours as  needed for moderate pain. 09/27/15  Yes Stark Klein, MD  levofloxacin (LEVAQUIN) 750 MG tablet Take 750 mg by mouth daily. Start date : 07/26/20   Yes [provider]  levothyroxine (SYNTHROID) 50 MCG tablet Take 50 mcg by mouth daily before breakfast.   Yes [provider]  losartan (COZAAR) 100 MG tablet Take 100 mg by mouth daily.   Yes [provider]  Multiple Vitamins-Minerals (PRESERVISION AREDS PO) Take 1 tablet by mouth daily.   Yes [provider]  omeprazole (PRILOSEC) 40 MG capsule Take 40 mg by mouth daily.   Yes [provider]  simvastatin (ZOCOR) 20 MG tablet Take 20 mg by mouth daily.   Yes [provider]  methocarbamol (ROBAXIN) 500 MG tablet Take 1 tablet (500 mg total) by mouth every 6 (six) hours as needed for muscle spasms. Patient not taking: No sig reported 09/28/15   Jill Alexanders, PA-C    Physical Exam:  Vitals:   07/28/20 2015 07/28/20 2115 07/28/20 2218 07/28/20 2330  BP: (!) 148/73 138/70 (!) 144/61 (!) 141/58  Pulse: 61 (!) 58 (!) 58 (!) 57  Resp: 16 20 20  (!) 25  Temp:      TempSrc:      SpO2: 93% 94% 93% 92%  Weight:      Height:        Constitutional: NAD, calm, comfortable Vitals:   07/28/20 2015 07/28/20 2115 07/28/20 2218 07/28/20 2330  BP: (!) 148/73 138/70 (!) 144/61 (!) 141/58  Pulse: 61 (!) 58 (!) 58 (!) 57  Resp: 16 20 20  (!) 25  Temp:      TempSrc:      SpO2: 93% 94% 93% 92%  Weight:      Height:       General: WDWN, Alert and oriented x3.  Eyes: EOMI, PERRL, conjunctivae normal.  Sclera nonicteric HENT:  [place]/AT, external ears normal.  Nares patent without epistasis.  Mucous membranes are moist. Neck: Soft, normal range of motion, supple, no masses, no thyromegaly. Trachea midline Respiratory:  Equal breath sounds. Diffuse rhonchi. Has expiratory wheezing, no crackles. Normal respiratory effort. No accessory muscle use.  Cardiovascular: Regular rate and rhythm, no murmurs / rubs  / gallops. No extremity edema. 2+ pedal pulses.  Abdomen: Soft, no tenderness, nondistended, no rebound or guarding.  No masses palpated. Bowel sounds normoactive Musculoskeletal: FROM. no cyanosis. No joint deformity upper and lower extremities. Normal muscle tone.  Skin: Warm, dry, intact no rashes, lesions, ulcers. No induration Neurologic: CN 2-12 grossly intact.  Normal speech.  Sensation intact. Strength 5/5 in all extremities.   Psychiatric: Normal judgment and insight.  Normal mood.    Labs on Admission: I have personally reviewed following labs and imaging studies  CBC: Recent Labs  Lab 07/28/20 1900  WBC 7.9  NEUTROABS 5.6  HGB 13.2  HCT 41.5  MCV 93.7  PLT [id number]    Basic Metabolic Panel: Recent Labs  Lab 07/28/20 1900  NA 142  K 4.6  CL 110  CO2 22  GLUCOSE 91  BUN 22  CREATININE 1.44*  CALCIUM 10.3    GFR: Estimated Creatinine Clearance: 22.1 mL/min (A) (by C-G formula based on SCr of 1.44 mg/dL (H)).  Liver Function Tests: No results for input(s): AST, ALT, ALKPHOS, BILITOT, PROT, ALBUMIN in the last 168 hours.  Urine analysis:    Component Value Date/Time   COLORURINE YELLOW 09/17/2015 1406   APPEARANCEUR CLEAR 09/17/2015 1406   LABSPEC 1.021 09/17/2015 1406   PHURINE 5.0 09/17/2015 1406   GLUCOSEU NEGATIVE 09/17/2015 1406   [place] 09/17/2015 1406   [place] 09/17/2015 1406   [place] 09/17/2015 1406   PROTEINUR NEGATIVE 09/17/2015 1406   UROBILINOGEN 1.0 02/03/2015 1020   NITRITE NEGATIVE 09/17/2015 1406   LEUKOCYTESUR TRACE (A) 09/17/2015 1406    Radiological Exams on Admission: CT Chest Wo Contrast  Result Date: 07/28/2020 CLINICAL DATA:  Persistent cough. EXAM: CT CHEST WITHOUT CONTRAST TECHNIQUE: Multidetector CT imaging of the chest was performed following the standard protocol without IV contrast. COMPARISON:  None. FINDINGS: Cardiovascular: There is moderate severity calcification of the aortic arch without  evidence of aortic aneurysm. Normal heart size with moderate severity coronary artery calcification. A small pericardial effusion is seen. Mediastinum/Nodes: A 2.0 cm x 1.1 cm subcarinal lymph node is seen. Thyroid gland, trachea, and esophagus demonstrate no significant findings. Lungs/Pleura: The lungs are hyperinflated. Mild patchy infiltrate is seen within the posterior aspects  of the mid right lung and right lung base. Very mild linear atelectasis is seen within the anterior aspect of the bilateral lower lobes. There is no evidence of a pleural effusion or pneumothorax. Upper Abdomen: There is a large gastric hernia with associated herniated mesenteric fat. Surgical clips are seen within the gallbladder fossa Musculoskeletal: Degenerative changes are seen throughout the thoracic spine. IMPRESSION: 1. Mild posterior right lower lobe infiltrate with very mild bilateral lower lobe linear atelectasis. 2. Large gastric hernia. 3. Evidence of prior cholecystectomy. Electronically Signed   By: Virgina Norfolk M.D.   On: 07/28/2020 22:12   DG Chest Port 1 View  Result Date: 07/28/2020 CLINICAL DATA:  Shortness of breath. EXAM: PORTABLE CHEST 1 VIEW COMPARISON:  PA and lateral chest 06/09/2020. FINDINGS: Lungs clear. Heart size normal. Large hiatal hernia. No pneumothorax or pleural fluid. No acute bony abnormality. IMPRESSION: No acute disease. Large hiatal hernia. Electronically Signed   By: Inge Rise M.D.   On: 07/28/2020 19:57    Assessment/Plan Principal Problem:   CAP (community acquired pneumonia) Ms. Cushman is admitted to [place] floor.  She is started on doxycycline for antibiotic coverage.  She has had outpatient antibiotics last few weeks without improvement.  She was given Levaquin on May 2 and she took 2 doses prior to coming to the hospital tonight. Patient has no hypoxia at this time.  We will continue to monitor O2 saturation and provide supplemental oxygen if is needed to keep O2 sat  between 92 to 96% Incentive spirometer every 2 hours while awake.  Patient be encouraged to turn and cough regularly for pulmonary toilet  Active Problems:   Asthma, moderate persistent Patient uses Advair at home and will be placed on Dulera due to formulary here in the hospital    Essential hypertension Continue home medication of Cozaar, Norvasc and monitor blood pressure    AKI (acute kidney injury)  IV fluid hydration with LR at 75 ml/hr overnight.  Recheck electrolytes renal function morning    Hypothyroidism Chronic.  Continue levothyroxine    DVT prophylaxis: Lovenox for DVT prophylaxis.  Code Status:   Full Code  Family Communication:  Diagnosis and plan discussed with patient.  She provides understanding and agrees with plan.  Further recommendations to follow as clinically indicated Disposition Plan:   Patient is from:  Home, resides in assisted living  Anticipated DC to:  Home  Anticipated DC date:  Anticipate at least 2 midnight stay in the hospital  Anticipated DC barriers: No barriers to discharge identified at this time  Admission status:  Inpatient   Yevonne Aline Mattie Nordell MD [facility]  How to contact the [facility] Attending or Consulting provider [place] or covering provider during after hours [place], for this patient?   1. Check the care team in [facility] and look for a) attending/consulting TR[facility] provider listed and b) the [facility] team listed 2. Log into [email or web address] and use [place] universal password to access. If you do not have the password, please contact the hospital operator. 3. Locate the [facility] provider you are looking for under [facility] and page to a number that you can be directly reached. 4. If you still have difficulty reaching the provider, please page the [facility] (Director on Call) for the Hospitalists listed on amion for assistance.  07/29/2020, 12:35 AM

## 2020-07-29 NOTE — Progress Notes (Signed)
Complained of feeling sick on the stomach, reached out to MD Posey Pronto for PRN order for nausea. Will continue to monitor.

## 2020-07-29 NOTE — Progress Notes (Signed)
Shift Summary:    Remained alert and oriented x 4. Son visited the majority of the shift. Ate about 50% of meals during this shift. RR remained even and unlabored. Dyspnea noted on exertion. Fluids discontinued during this shift, per md/mar. Encouraged to use flutter valve, during this shift. Slept the majority of the shift. No other needs identified. Will continue to monitor.

## 2020-07-29 NOTE — Plan of Care (Signed)

## 2020-07-29 NOTE — TOC Progression Note (Signed)
Transition of Care [facility]) - Progression Note    Patient Details  Name: Pamela Mejia MRN: [id number] Date of Birth: [birth year]  [facility]) CM/SW Contact  Purcell Mouton, RN Phone Number: 07/29/2020, 10:56 AM  Clinical Narrative:    Spoke with pt's son concerning disposition and where pt was from. Pt is from [place] [facility] and plan to return. TOC will continue to follow for discharge.      Expected Discharge Plan: Home/Self Care Barriers to Discharge: No Barriers Identified  Expected Discharge Plan and Services Expected Discharge Plan: Home/Self Care       Living arrangements for the past 2 months: [place]                                       Social Determinants of Health (SDOH) Interventions    Readmission Risk Interventions No flowsheet data found.

## 2020-07-30 MED ORDER — DOXYCYCLINE HYCLATE 100 MG PO TABS: 100.0000 mg | ORAL_TABLET | Freq: Two times a day (BID) | ORAL | 0 refills | Status: AC

## 2020-07-30 MED ORDER — BENZONATATE 100 MG PO CAPS: 100.0000 mg | ORAL_CAPSULE | Freq: Three times a day (TID) | ORAL | 0 refills | Status: AC

## 2020-07-30 MED ORDER — PREDNISONE 10 MG PO TABS: ORAL_TABLET | ORAL | 0 refills | Status: AC

## 2020-07-30 MED ORDER — GUAIFENESIN 100 MG/5ML PO SOLN: 10.0000 mL | Freq: Four times a day (QID) | ORAL | 0 refills | Status: AC

## 2020-07-30 NOTE — Plan of Care (Signed)
  Problem: Coping: Goal: Level of anxiety will decrease Outcome: Progressing   Problem: Nutrition: Goal: Adequate nutrition will be maintained Outcome: Progressing   Problem: Education: Goal: Knowledge of General Education information will improve Description: Including pain rating scale, medication(s)/side effects and non-pharmacologic comfort measures Outcome: Progressing   Problem: Pain Managment: Goal: General experience of comfort will improve Outcome: Progressing   Problem: Safety: Goal: Ability to remain free from injury will improve Outcome: Progressing   Problem: Skin Integrity: Goal: Risk for impaired skin integrity will decrease Outcome: Progressing

## 2020-07-30 NOTE — Progress Notes (Signed)
Discharge instructions provided. Patient and son endorsed understanding of discharge paperwork. Pushed out via wheelchair.  No other needs identified.

## 2020-07-30 NOTE — TOC Transition Note (Addendum)
Transition of Care [facility]) - CM/SW Discharge Note   Patient Details  Name: Pamela Mejia MRN: [id number] Date of Birth: 12-Sep-1926  [facility]) CM/SW Contact:  Ross Ludwig, LCSW Phone Number: 07/30/2020, 2:49 PM   Clinical Narrative:     Patient was offered choice for [facility] and she chose [place].  CSW spoke to [place] they said they can see her as long as her PCP is willing to sign off on [place] orders.  Per patient she has an appointment with her PCP on Tuesday.  Per home health agency she should confirm with her PCP that he will approve for home health.    Patient will be going home with home health PT through [place].  CSW signing off please reconsult with any other social work needs, home health agency has been notified of planned discharge.   Final next level of care: [place] Barriers to Discharge: Barriers Resolved   Patient Goals and CMS Choice Patient states their goals for this hospitalization and ongoing recovery are:: To return back home CMS Medicare.gov Compare Post Acute Care list provided to:: Patient Choice offered to / list presented to : Patient  Discharge Placement  Patient discharging back home to [place] living.                     Discharge Plan and Services                          HH Arranged: PT [place] Agency: [place] Date [facility] Agency Contacted: 07/30/20 Time [place]: [phone] Representative spoke with at [place]: Cindie  Social Determinants of Health ([place]) Interventions     Readmission Risk Interventions No flowsheet data found.

## 2020-07-30 NOTE — Evaluation (Signed)
Physical Therapy Evaluation Patient Details Name: Pamela Mejia MRN: [id number] DOB: Mar 05, 1927 Today's Date: 07/30/2020   History of Present Illness  87 y.o. female with medical history significant for asthma, HTN, hx of blood clots, GERD, hypothyroidism, ocular melanoma with R eye blind, L cataract and admitted for CAP  Clinical Impression  Pt admitted with above diagnosis.  Pt currently with functional limitations due to the deficits listed below (see PT Problem List). Pt will benefit from skilled PT to increase their independence and safety with mobility to allow discharge to the venue listed below.  Pt assisted with ambulating in hallway.  Pt steady with walker, reports she can use rollator at home. SPO2 93-95% on room air, pt with moderate SOB and increased work of breathing end of ambulation.  Pt educated normal activities will likely take longer, and she will need to take rest breaks.  Pt feels she can return to ILF and agreeable to HHPT upon d/c.     Follow Up Recommendations Home health PT    Equipment Recommendations  None recommended by PT    Recommendations for Other Services       Precautions / Restrictions Precautions Precautions: Fall Precaution Comments: monitor sats      Mobility  Bed Mobility Overal bed mobility: Needs Assistance Bed Mobility: Supine to Sit;Sit to Supine     Supine to sit: Supervision Sit to supine: Supervision        Transfers Overall transfer level: Needs assistance Equipment used: Rolling walker (2 wheeled) Transfers: Sit to/from Stand Sit to Stand: Min guard         General transfer comment: min/guard for safety (pt reports not being up since admission)  Ambulation/Gait Ambulation/Gait assistance: Min guard Gait Distance (Feet): 240 Feet Assistive device: Rolling walker (2 wheeled) Gait Pattern/deviations: Step-through pattern;Decreased stride length     General Gait Details: pt steady with walker, reports she can use  rollator at home, SPO2 93-95% on room air, pt with moderate SOB and increased work of breathing end of ambulation  Stairs            Wheelchair Mobility    Modified Rankin (Stroke Patients Only)       Balance                                             Pertinent Vitals/Pain Pain Assessment: No/denies pain    Home Living Family/patient expects to be discharged to:: Private residence Living Arrangements: Alone   Type of Home: Independent living facility       Home Layout: One level Home Equipment: Environmental consultant - 4 wheels      Prior Function Level of Independence: Independent with assistive device(s)         Comments: uses rollator for longer distances     Hand Dominance        Extremity/Trunk Assessment        Lower Extremity Assessment Lower Extremity Assessment: Generalized weakness    Cervical / Trunk Assessment Cervical / Trunk Assessment: Kyphotic  Communication   Communication: No difficulties  Cognition Arousal/Alertness: Awake/alert Behavior During Therapy: WFL for tasks assessed/performed Overall Cognitive Status: Within Functional Limits for tasks assessed  General Comments General comments (skin integrity, edema, etc.): Pt denies any recent falls    Exercises     Assessment/Plan    PT Assessment Patient needs continued PT services  PT Problem List Decreased strength;Decreased mobility;Cardiopulmonary status limiting activity;Decreased activity tolerance;Decreased knowledge of use of DME;Decreased balance       PT Treatment Interventions Gait training;DME instruction;Therapeutic exercise;Balance training;Functional mobility training;Therapeutic activities;Patient/family education    PT Goals (Current goals can be found in the Care Plan section)  Acute Rehab PT Goals PT Goal Formulation: With patient Time For Goal Achievement: 08/13/20 Potential to Achieve  Goals: Good    Frequency Min 3X/week   Barriers to discharge        Co-evaluation               AM-PAC PT "6 Clicks" Mobility  Outcome Measure Help needed turning from your back to your side while in a flat bed without using bedrails?: A Little Help needed moving from lying on your back to sitting on the side of a flat bed without using bedrails?: A Little Help needed moving to and from a bed to a chair (including a wheelchair)?: A Little Help needed standing up from a chair using your arms (e.g., wheelchair or bedside chair)?: A Little Help needed to walk in hospital room?: A Little Help needed climbing 3-5 steps with a railing? : A Lot 6 Click Score: 17    End of Session Equipment Utilized During Treatment: Gait belt Activity Tolerance: Patient tolerated treatment well Patient left: in bed;with call bell/phone within reach;with bed alarm set;with family/visitor present Nurse Communication: Mobility status PT Visit Diagnosis: Difficulty in walking, not elsewhere classified (R26.2)    Time: 1020-1041 PT Time Calculation (min) (ACUTE ONLY): 21 min   Charges:   PT Evaluation $PT Eval Low Complexity: 1 Low         Kati PT, DPT Acute Rehabilitation Services Pager: [phone] Office: [phone] [place] E 07/30/2020, 12:23 PM

## 2020-08-02 NOTE — Discharge Summary (Signed)
[facility] Discharge Summary   Patient: Pamela Mejia OQH:[id number]  PCP: Lorene Dy, MD  Date of admission: 07/28/2020   Date of discharge: 07/30/2020      Discharge Diagnoses:  Principal Problem:   CAP (community acquired pneumonia) Active Problems:   Asthma, moderate persistent   Hypothyroidism   Essential hypertension   AKI (acute kidney injury) ([place])   Admitted From: Home Disposition:  Home with home health  Recommendations for Outpatient Follow-up:  1. PCP: Follow-up with PCP in 1 week 2. Follow up LABS/TEST: None 3. New Meds: Tessalon Perles, doxycycline, Mucinex, prednisone taper 4. Changed meds: None 5. Stopped meds: None.   Follow-up Information    Lorene Dy, MD. Schedule an appointment as soon as possible for a visit in 1 week(s).   Specialty: Internal Medicine Contact information: [address], [place] [place] [address] [phone]              Discharge Instructions    Ambulatory referral to Pulmonology   Complete by: As directed    Cough x 3 months, questionable infiltrate on CT chest, unclear if pneumonia or chronic condition   Reason for referral: Cough   Diet - low sodium heart healthy   Complete by: As directed    Increase activity slowly   Complete by: As directed       Diet recommendation: Cardiac diet  Activity: The patient is advised to gradually reintroduce usual activities, as tolerated  Discharge Condition: stable  Code Status: Full code   History of present illness: As per the H and P dictated on admission, "Pamela Mejia is a 87 y.o. female with medical history significant for asthma, HTN, hx of blood clots, GERD, hypothyroidism who presents for valuation of shortness of breath and cough.  She reports she has had a chronic cough for the last 3 months.  She states she has been on multiple antibiotics as an outpatient but continues to get worse.  She states she was seen by her PCP 2 days ago and was placed on  Levaquin and she is taken 2 doses of this.  She states she has not had any fever but has had intermittent chills.  She states the cough is nonproductive.  She has not taken any over-the-counter cough medications.  She does have a history of asthma has been using her Advair as prescribed.  She has not noted any relieving or aggravating factors for her cough.  She denies any recent travel or prolonged immobilization.  She said she has not had any change in her diet and has not had any nausea or vomiting, abdominal pain or urinary symptoms."  Hospital Course:  Summary of her active problems in the hospital is as following.   Community-acquired pneumonia. Concern for bronchitis/asthma exacerbation Treated with IV antibiotics. Cultures so far negative. Will discharge on oral antibiotics as well as prednisone taper.  Essential hypertension Continue home regimen.  Hypothyroidism Continue Synthroid.  Acute kidney injury Baseline serum creatinine 0.9.  On presentation serum creatinine 1.4. Improving.  Without any intervention. Continue home regimen.  Patient was seen by physical therapy, who recommended Home Health On the day of the discharge the patient's vitals were stable, and no other acute medical condition were reported by patient. The patient was felt safe to be discharge at Home with Home health.  Consultants: None Procedures: None  DISCHARGE MEDICATION: Allergies as of 07/30/2020      Reactions   Eggs Or Egg-derived Products Shortness Of Breath   Penicillins  Shortness Of Breath, Rash   Patient is unable to answer PCN questions at this time. Has patient had a PCN reaction causing immediate rash, facial/tongue/throat swelling, SOB or lightheadedness with hypotension: Yes Has patient had a PCN reaction causing severe rash involving mucus membranes or skin necrosis: No Has patient had a PCN reaction that required hospitalization Yes Has patient had a PCN reaction occurring within the  last 10 years: No If all of the above answers are "NO", then may proceed with Cephalosporin use.   Soy Allergy Hives   Tylenol With Codeine #3 [acetaminophen-codeine]    hyper      Medication List    STOP taking these medications   levofloxacin 750 MG tablet Commonly known as: LEVAQUIN   methocarbamol 500 MG tablet Commonly known as: ROBAXIN     TAKE these medications   amLODipine 5 MG tablet Commonly known as: NORVASC Take 5 mg by mouth daily.   benzonatate 100 MG capsule Commonly known as: TESSALON Take 1 capsule (100 mg total) by mouth 3 (three) times daily.   doxycycline 100 MG tablet Commonly known as: VIBRA-TABS Take 1 tablet (100 mg total) by mouth 2 (two) times daily for 5 days.   Fluticasone-Salmeterol 100-50 MCG/DOSE Aepb Commonly known as: ADVAIR Inhale 1 puff into the lungs 2 (two) times daily.   guaiFENesin 100 MG/5ML Soln Commonly known as: ROBITUSSIN Take 10 mLs (200 mg total) by mouth 4 (four) times daily.   HYDROcodone-acetaminophen 5-325 MG tablet Commonly known as: NORCO/VICODIN Take 1-2 tablets by mouth every 4 (four) hours as needed for moderate pain.   levothyroxine 50 MCG tablet Commonly known as: SYNTHROID Take 50 mcg by mouth daily before breakfast.   losartan 100 MG tablet Commonly known as: COZAAR Take 100 mg by mouth daily.   omeprazole 40 MG capsule Commonly known as: PRILOSEC Take 40 mg by mouth daily.   predniSONE 10 MG tablet Commonly known as: DELTASONE Take 40mg  daily for 3days,Take 30mg  daily for 3days,Take 20mg  daily for 3days,Take 10mg  daily for 3days, then stop   PRESERVISION AREDS PO Take 1 tablet by mouth daily.   simvastatin 20 MG tablet Commonly known as: ZOCOR Take 20 mg by mouth daily.       Discharge Exam: Filed Weights   07/28/20 1820 07/29/20 0300  Weight: 68.9 kg 53.8 kg   Vitals:   07/30/20 0856 07/30/20 1411  BP: (!) 151/92 (!) 154/87  Pulse:  92  Resp:  18  Temp:  98 F (36.7 C)  SpO2:   93%   General: Appear in mild distress, no Rash; Oral Mucosa Clear, moist. no Abnormal Neck Mass Or lumps, Conjunctiva normal  Cardiovascular: S1 and S2 Present, no Murmur, Respiratory: good respiratory effort, Bilateral Air entry present and CTA, no Crackles, no wheezes Abdomen: Bowel Sound present, Soft and no tenderness Extremities: no Pedal edema Neurology: alert and oriented to time, place, and person affect appropriate. no new focal deficit Gait not checked due to patient safety concerns    The results of significant diagnostics from this hospitalization (including imaging, microbiology, ancillary and laboratory) are listed below for reference.    Significant Diagnostic Studies: CT Chest Wo Contrast  Result Date: 07/28/2020 CLINICAL DATA:  Persistent cough. EXAM: CT CHEST WITHOUT CONTRAST TECHNIQUE: Multidetector CT imaging of the chest was performed following the standard protocol without IV contrast. COMPARISON:  None. FINDINGS: Cardiovascular: There is moderate severity calcification of the aortic arch without evidence of aortic aneurysm. Normal heart size with moderate  severity coronary artery calcification. A small pericardial effusion is seen. Mediastinum/Nodes: A 2.0 cm x 1.1 cm subcarinal lymph node is seen. Thyroid gland, trachea, and esophagus demonstrate no significant findings. Lungs/Pleura: The lungs are hyperinflated. Mild patchy infiltrate is seen within the posterior aspects of the mid right lung and right lung base. Very mild linear atelectasis is seen within the anterior aspect of the bilateral lower lobes. There is no evidence of a pleural effusion or pneumothorax. Upper Abdomen: There is a large gastric hernia with associated herniated mesenteric fat. Surgical clips are seen within the gallbladder fossa Musculoskeletal: Degenerative changes are seen throughout the thoracic spine. IMPRESSION: 1. Mild posterior right lower lobe infiltrate with very mild bilateral lower lobe  linear atelectasis. 2. Large gastric hernia. 3. Evidence of prior cholecystectomy. Electronically Signed   By: Virgina Norfolk M.D.   On: 07/28/2020 22:12   DG Chest Port 1 View  Result Date: 07/28/2020 CLINICAL DATA:  Shortness of breath. EXAM: PORTABLE CHEST 1 VIEW COMPARISON:  PA and lateral chest 06/09/2020. FINDINGS: Lungs clear. Heart size normal. Large hiatal hernia. No pneumothorax or pleural fluid. No acute bony abnormality. IMPRESSION: No acute disease. Large hiatal hernia. Electronically Signed   By: Inge Rise M.D.   On: 07/28/2020 19:57    Microbiology: Recent Results (from the past 240 hour(s))  Resp Panel by RT-PCR (Flu A&B, Covid) Nasopharyngeal Swab     Status: None   Collection Time: 07/28/20  6:55 PM   Specimen: Nasopharyngeal Swab; Nasopharyngeal(NP) swabs in vial transport medium  Result Value Ref Range Status   SARS Coronavirus 2 by RT PCR NEGATIVE NEGATIVE Final    Comment: (NOTE) SARS-CoV-2 target nucleic acids are NOT DETECTED.  The SARS-CoV-2 RNA is generally detectable in upper respiratory specimens during the acute phase of infection. The lowest concentration of SARS-CoV-2 viral copies this assay can detect is 138 copies/mL. A negative result does not preclude SARS-Cov-2 infection and should not be used as the sole basis for treatment or other patient management decisions. A negative result may occur with  improper specimen collection/handling, submission of specimen other than nasopharyngeal swab, presence of viral mutation(s) within the areas targeted by this assay, and inadequate number of viral copies(<138 copies/mL). A negative result must be combined with clinical observations, patient history, and epidemiological information. The expected result is Negative.  Fact Sheet for Patients:  EntrepreneurPulse.com.au  Fact Sheet for Healthcare Providers:  IncredibleEmployment.be  This test is no t yet approved or  cleared by the [place] FDA and  has been authorized for detection and/or diagnosis of SARS-CoV-2 by FDA under an Emergency Use Authorization (EUA). This EUA will remain  in effect (meaning this test can be used) for the duration of the COVID-19 declaration under Section 564(b)(1) of the Act, 21 U.S.C.section 360bbb-3(b)(1), unless the authorization is terminated  or revoked sooner.       Influenza A by PCR NEGATIVE NEGATIVE Final   Influenza B by PCR NEGATIVE NEGATIVE Final    Comment: (NOTE) The Xpert Xpress SARS-CoV-2/FLU/RSV plus assay is intended as an aid in the diagnosis of influenza from Nasopharyngeal swab specimens and should not be used as a sole basis for treatment. Nasal washings and aspirates are unacceptable for Xpert Xpress SARS-CoV-2/FLU/RSV testing.  Fact Sheet for Patients: EntrepreneurPulse.com.au  Fact Sheet for Healthcare Providers: IncredibleEmployment.be  This test is not yet approved or cleared by the [place] FDA and has been authorized for detection and/or diagnosis of SARS-CoV-2 by FDA under an Emergency  Use Authorization (EUA). This EUA will remain in effect (meaning this test can be used) for the duration of the COVID-19 declaration under Section 564(b)(1) of the Act, 21 U.S.C. section 360bbb-3(b)(1), unless the authorization is terminated or revoked.  Performed at [facility], [place] [address] [place], [place] [address]      Labs: CBC: Recent Labs  Lab 07/28/20 1900 07/29/20 0401  WBC 7.9 8.2  NEUTROABS 5.6  --   HGB 13.2 11.6*  HCT 41.5 35.3*  MCV 93.7 92.2  PLT 250 [id number]   Basic Metabolic Panel: Recent Labs  Lab 07/28/20 1900 07/29/20 0401  NA 142 140  K 4.6 4.4  CL 110 111  CO2 22 18*  GLUCOSE 91 84  BUN 22 21  CREATININE 1.44* 1.26*  CALCIUM 10.3 9.1   Liver Function Tests: No results for input(s): AST, ALT, ALKPHOS, BILITOT, PROT, ALBUMIN in the last 168  hours. CBG: No results for input(s): GLUCAP in the last 168 hours.  Time spent: 35 minutes  Signed:  Berle Mull  [facility] 07/30/2020 8:50 AM

## 2020-08-24 ENCOUNTER — Encounter (HOSPITAL_COMMUNITY): Payer: Self-pay

## 2020-08-24 ENCOUNTER — Inpatient Hospital Stay (HOSPITAL_COMMUNITY)
Admission: EM | Admit: 2020-08-24 | Discharge: 2020-09-24 | DRG: 177 | Disposition: E | Payer: Medicare Other | Source: Skilled Nursing Facility | Attending: Internal Medicine | Admitting: Internal Medicine

## 2020-08-24 ENCOUNTER — Other Ambulatory Visit: Payer: Self-pay

## 2020-08-24 ENCOUNTER — Emergency Department (HOSPITAL_COMMUNITY): Payer: Medicare Other

## 2020-08-24 DIAGNOSIS — E039 Hypothyroidism, unspecified: Secondary | ICD-10-CM | POA: Diagnosis present

## 2020-08-24 DIAGNOSIS — E785 Hyperlipidemia, unspecified: Secondary | ICD-10-CM | POA: Diagnosis present

## 2020-08-24 DIAGNOSIS — D6489 Other specified anemias: Secondary | ICD-10-CM | POA: Diagnosis present

## 2020-08-24 DIAGNOSIS — Z6823 Body mass index (BMI) 23.0-23.9, adult: Secondary | ICD-10-CM | POA: Diagnosis not present

## 2020-08-24 DIAGNOSIS — Z7189 Other specified counseling: Secondary | ICD-10-CM | POA: Diagnosis not present

## 2020-08-24 DIAGNOSIS — M549 Dorsalgia, unspecified: Secondary | ICD-10-CM | POA: Diagnosis present

## 2020-08-24 DIAGNOSIS — T380X5A Adverse effect of glucocorticoids and synthetic analogues, initial encounter: Secondary | ICD-10-CM | POA: Diagnosis present

## 2020-08-24 DIAGNOSIS — R627 Adult failure to thrive: Secondary | ICD-10-CM | POA: Diagnosis not present

## 2020-08-24 DIAGNOSIS — Z881 Allergy status to other antibiotic agents status: Secondary | ICD-10-CM

## 2020-08-24 DIAGNOSIS — J690 Pneumonitis due to inhalation of food and vomit: Secondary | ICD-10-CM | POA: Diagnosis present

## 2020-08-24 DIAGNOSIS — Z72820 Sleep deprivation: Secondary | ICD-10-CM

## 2020-08-24 DIAGNOSIS — Z515 Encounter for palliative care: Secondary | ICD-10-CM | POA: Diagnosis not present

## 2020-08-24 DIAGNOSIS — E870 Hyperosmolality and hypernatremia: Secondary | ICD-10-CM | POA: Diagnosis not present

## 2020-08-24 DIAGNOSIS — R6510 Systemic inflammatory response syndrome (SIRS) of non-infectious origin without acute organ dysfunction: Secondary | ICD-10-CM

## 2020-08-24 DIAGNOSIS — J189 Pneumonia, unspecified organism: Secondary | ICD-10-CM | POA: Diagnosis present

## 2020-08-24 DIAGNOSIS — Z20822 Contact with and (suspected) exposure to covid-19: Secondary | ICD-10-CM | POA: Diagnosis present

## 2020-08-24 DIAGNOSIS — H5461 Unqualified visual loss, right eye, normal vision left eye: Secondary | ICD-10-CM | POA: Diagnosis present

## 2020-08-24 DIAGNOSIS — Z823 Family history of stroke: Secondary | ICD-10-CM

## 2020-08-24 DIAGNOSIS — I10 Essential (primary) hypertension: Secondary | ICD-10-CM | POA: Diagnosis present

## 2020-08-24 DIAGNOSIS — Z85840 Personal history of malignant neoplasm of eye: Secondary | ICD-10-CM

## 2020-08-24 DIAGNOSIS — Z79899 Other long term (current) drug therapy: Secondary | ICD-10-CM

## 2020-08-24 DIAGNOSIS — G9341 Metabolic encephalopathy: Secondary | ICD-10-CM | POA: Diagnosis not present

## 2020-08-24 DIAGNOSIS — Z91018 Allergy to other foods: Secondary | ICD-10-CM

## 2020-08-24 DIAGNOSIS — R0602 Shortness of breath: Secondary | ICD-10-CM

## 2020-08-24 DIAGNOSIS — E440 Moderate protein-calorie malnutrition: Secondary | ICD-10-CM | POA: Diagnosis present

## 2020-08-24 DIAGNOSIS — Z91012 Allergy to eggs: Secondary | ICD-10-CM

## 2020-08-24 DIAGNOSIS — N179 Acute kidney failure, unspecified: Secondary | ICD-10-CM | POA: Diagnosis present

## 2020-08-24 DIAGNOSIS — R0902 Hypoxemia: Secondary | ICD-10-CM | POA: Diagnosis present

## 2020-08-24 DIAGNOSIS — Z66 Do not resuscitate: Secondary | ICD-10-CM | POA: Diagnosis present

## 2020-08-24 DIAGNOSIS — N289 Disorder of kidney and ureter, unspecified: Secondary | ICD-10-CM

## 2020-08-24 DIAGNOSIS — Z833 Family history of diabetes mellitus: Secondary | ICD-10-CM

## 2020-08-24 DIAGNOSIS — J45901 Unspecified asthma with (acute) exacerbation: Secondary | ICD-10-CM | POA: Diagnosis present

## 2020-08-24 DIAGNOSIS — E876 Hypokalemia: Secondary | ICD-10-CM | POA: Diagnosis not present

## 2020-08-24 DIAGNOSIS — Z820 Family history of epilepsy and other diseases of the nervous system: Secondary | ICD-10-CM

## 2020-08-24 DIAGNOSIS — R0603 Acute respiratory distress: Secondary | ICD-10-CM | POA: Diagnosis present

## 2020-08-24 DIAGNOSIS — R064 Hyperventilation: Secondary | ICD-10-CM | POA: Diagnosis not present

## 2020-08-24 DIAGNOSIS — Z880 Allergy status to penicillin: Secondary | ICD-10-CM

## 2020-08-24 DIAGNOSIS — Z79890 Hormone replacement therapy (postmenopausal): Secondary | ICD-10-CM

## 2020-08-24 DIAGNOSIS — R531 Weakness: Secondary | ICD-10-CM | POA: Diagnosis not present

## 2020-08-24 DIAGNOSIS — Z8249 Family history of ischemic heart disease and other diseases of the circulatory system: Secondary | ICD-10-CM

## 2020-08-24 LAB — CBC WITH DIFFERENTIAL/PLATELET
Abs Immature Granulocytes: 0.5 10*3/uL — ABNORMAL HIGH (ref 0.00–0.07)
Band Neutrophils: 3 %
Basophils Absolute: 0 10*3/uL (ref 0.0–0.1)
Basophils Relative: 0 %
Eosinophils Absolute: 0 10*3/uL (ref 0.0–0.5)
Eosinophils Relative: 0 %
HCT: 37.6 % (ref 36.0–46.0)
Hemoglobin: 11.8 g/dL — ABNORMAL LOW (ref 12.0–15.0)
Lymphocytes Relative: 23 %
Lymphs Abs: 2.3 10*3/uL (ref 0.7–4.0)
MCH: 29.6 pg (ref 26.0–34.0)
MCHC: 31.4 g/dL (ref 30.0–36.0)
MCV: 94.5 fL (ref 80.0–100.0)
Metamyelocytes Relative: 1 %
Monocytes Absolute: 0.4 10*3/uL (ref 0.1–1.0)
Monocytes Relative: 4 %
Myelocytes: 4 %
Neutro Abs: 6.7 10*3/uL (ref 1.7–7.7)
Neutrophils Relative %: 65 %
Platelets: 274 10*3/uL (ref 150–400)
RBC: 3.98 MIL/uL (ref 3.87–5.11)
RDW: 13.3 % (ref 11.5–15.5)
WBC: 9.8 10*3/uL (ref 4.0–10.5)
nRBC: 0 % (ref 0.0–0.2)

## 2020-08-24 LAB — BRAIN NATRIURETIC PEPTIDE: B Natriuretic Peptide: 87.2 pg/mL (ref 0.0–100.0)

## 2020-08-24 LAB — POC SARS CORONAVIRUS 2 AG -  ED: SARSCOV2ONAVIRUS 2 AG: NEGATIVE

## 2020-08-24 LAB — COMPREHENSIVE METABOLIC PANEL
ALT: 27 U/L (ref 0–44)
AST: 61 U/L — ABNORMAL HIGH (ref 15–41)
Albumin: 2.9 g/dL — ABNORMAL LOW (ref 3.5–5.0)
Alkaline Phosphatase: 141 U/L — ABNORMAL HIGH (ref 38–126)
Anion gap: 13 (ref 5–15)
BUN: 36 mg/dL — ABNORMAL HIGH (ref 8–23)
CO2: 22 mmol/L (ref 22–32)
Calcium: 9.7 mg/dL (ref 8.9–10.3)
Chloride: 108 mmol/L (ref 98–111)
Creatinine, Ser: 1.72 mg/dL — ABNORMAL HIGH (ref 0.44–1.00)
GFR, Estimated: 28 mL/min — ABNORMAL LOW (ref >60)
Glucose, Bld: 99 mg/dL (ref 70–99)
Potassium: 4.2 mmol/L (ref 3.5–5.1)
Sodium: 143 mmol/L (ref 135–145)
Total Bilirubin: 0.9 mg/dL (ref 0.3–1.2)
Total Protein: 6.9 g/dL (ref 6.5–8.1)

## 2020-08-24 LAB — URINALYSIS, ROUTINE W REFLEX MICROSCOPIC
Bilirubin Urine: NEGATIVE
Glucose, UA: NEGATIVE mg/dL
Hgb urine dipstick: NEGATIVE
Ketones, ur: 5 mg/dL — AB
Nitrite: NEGATIVE
Protein, ur: NEGATIVE mg/dL
Specific Gravity, Urine: 1.013 (ref 1.005–1.030)
pH: 5 (ref 5.0–8.0)

## 2020-08-24 LAB — MRSA PCR SCREENING: MRSA by PCR: NEGATIVE

## 2020-08-24 MED ORDER — SODIUM CHLORIDE 0.9 % IV BOLUS
1000.0000 mL | Freq: Once | INTRAVENOUS | Status: AC
  Administered 2020-08-24: 1000 mL via INTRAVENOUS

## 2020-08-24 MED ORDER — ONDANSETRON HCL 4 MG/2ML IJ SOLN: 4.0000 mg | Freq: Four times a day (QID) | INTRAMUSCULAR | Status: DC | PRN

## 2020-08-24 MED ORDER — ALBUTEROL SULFATE (2.5 MG/3ML) 0.083% IN NEBU
2.5000 mg | INHALATION_SOLUTION | RESPIRATORY_TRACT | Status: DC | PRN
  Administered 2020-08-24 – 2020-08-27 (×4): 2.5 mg via RESPIRATORY_TRACT
  Filled 2020-08-24 (×4): qty 3

## 2020-08-24 MED ORDER — SIMVASTATIN 20 MG PO TABS
20.0000 mg | ORAL_TABLET | Freq: Every day | ORAL | Status: DC
  Administered 2020-08-25 – 2020-08-26 (×2): 20 mg via ORAL
  Filled 2020-08-24 (×3): qty 1

## 2020-08-24 MED ORDER — VANCOMYCIN HCL 500 MG/100ML IV SOLN: 500.0000 mg | INTRAVENOUS | Status: DC

## 2020-08-24 MED ORDER — MOMETASONE FURO-FORMOTEROL FUM 200-5 MCG/ACT IN AERO
2.0000 | INHALATION_SPRAY | Freq: Two times a day (BID) | RESPIRATORY_TRACT | Status: DC
  Administered 2020-08-25 – 2020-09-03 (×19): 2 via RESPIRATORY_TRACT
  Filled 2020-08-24: qty 8.8

## 2020-08-24 MED ORDER — ALBUTEROL SULFATE (2.5 MG/3ML) 0.083% IN NEBU
5.0000 mg | INHALATION_SOLUTION | Freq: Once | RESPIRATORY_TRACT | Status: AC
  Administered 2020-08-24: 5 mg via RESPIRATORY_TRACT
  Filled 2020-08-24: qty 6

## 2020-08-24 MED ORDER — SODIUM CHLORIDE 0.9 % IV SOLN
1.0000 g | Freq: Three times a day (TID) | INTRAVENOUS | Status: DC
  Administered 2020-08-25 (×2): 1 g via INTRAVENOUS
  Filled 2020-08-24 (×3): qty 1

## 2020-08-24 MED ORDER — GUAIFENESIN ER 600 MG PO TB12
600.0000 mg | ORAL_TABLET | Freq: Two times a day (BID) | ORAL | Status: DC
  Administered 2020-08-24 – 2020-09-02 (×16): 600 mg via ORAL
  Filled 2020-08-24 (×18): qty 1

## 2020-08-24 MED ORDER — SODIUM CHLORIDE 0.9 % IV SOLN
2.0000 g | Freq: Once | INTRAVENOUS | Status: AC
  Administered 2020-08-24: 2 g via INTRAVENOUS
  Filled 2020-08-24 (×2): qty 2

## 2020-08-24 MED ORDER — ONDANSETRON HCL 4 MG PO TABS: 4.0000 mg | ORAL_TABLET | Freq: Four times a day (QID) | ORAL | Status: DC | PRN

## 2020-08-24 MED ORDER — AMLODIPINE BESYLATE 5 MG PO TABS
5.0000 mg | ORAL_TABLET | Freq: Every day | ORAL | Status: DC
  Administered 2020-08-25 – 2020-09-02 (×8): 5 mg via ORAL
  Filled 2020-08-24 (×9): qty 1

## 2020-08-24 MED ORDER — LEVOTHYROXINE SODIUM 50 MCG PO TABS
50.0000 ug | ORAL_TABLET | Freq: Every day | ORAL | Status: DC
  Administered 2020-08-25 – 2020-09-03 (×10): 50 ug via ORAL
  Filled 2020-08-24 (×10): qty 1

## 2020-08-24 MED ORDER — VANCOMYCIN HCL IN DEXTROSE 1-5 GM/200ML-% IV SOLN
1000.0000 mg | Freq: Once | INTRAVENOUS | Status: AC
  Administered 2020-08-24: 1000 mg via INTRAVENOUS
  Filled 2020-08-24: qty 200

## 2020-08-24 MED ORDER — LOSARTAN POTASSIUM 50 MG PO TABS: 100.0000 mg | ORAL_TABLET | Freq: Every day | ORAL | Status: DC

## 2020-08-24 NOTE — Progress Notes (Signed)
Pharmacy Antibiotic Note  Pamela Mejia is a 87 y.o. female admitted on 08/07/2020 with pneumonia.  Pharmacy has been consulted for aztreonam and vancomycin dosing.  IV antibiotics administered in ED: Vancomycin 1 g Aztreonam 2 g  Plan: Aztreonam 1 g IV every 8 hours Vancomycin 500 mg IV every 36 hours (Goal AUC 400-550, Expected AUC: 503.2, SCr used: 1.72)  Monitor clinical picture, renal function, vancomycin levels if indicated F/U C&S, abx deescalation / LOT   Height: 5' (152.4 cm) Weight: 53.8 kg (118 lb 9.7 oz) IBW/kg (Calculated) : 45.5  Temp (24hrs), Avg:97.7 F (36.5 C), Min:97.6 F (36.4 C), Max:97.8 F (36.6 C)  Recent Labs  Lab 08/18/2020 1233  WBC 9.8  CREATININE 1.72*    Estimated Creatinine Clearance: 15.3 mL/min (A) (by C-G formula based on SCr of 1.72 mg/dL (H)).    Allergies  Allergen Reactions  . Eggs Or Egg-Derived Products Shortness Of Breath  . Penicillins Shortness Of Breath and Rash    Patient is unable to answer PCN questions at this time. Has patient had a PCN reaction causing immediate rash, facial/tongue/throat swelling, SOB or lightheadedness with hypotension: Yes Has patient had a PCN reaction causing severe rash involving mucus membranes or skin necrosis: No Has patient had a PCN reaction that required hospitalization Yes Has patient had a PCN reaction occurring within the last 10 years: No If all of the above answers are "NO", then may proceed with Cephalosporin use.    . Soy Allergy Hives  . Tylenol With Codeine #3 [Acetaminophen-Codeine]     hyper    Antimicrobials this admission: 5/31 aztreonam >>  5/31 vancomycin >>   Dose adjustments this admission:   Microbiology results: 5/31 MRSA PCR: neg  Thank you for allowing pharmacy to be a part of this patient's care.  Efraim Kaufmann, PharmD, BCPS 08/15/2020 9:26 PM

## 2020-08-24 NOTE — ED Notes (Signed)
ED provider at the bedside to evaluate.  

## 2020-08-24 NOTE — ED Provider Notes (Signed)
[name] Provider Note   CSN: [id number] Arrival date & time: 07/28/2020  1127     History Chief Complaint  Patient presents with  . Shortness of Breath    Dx'd with PNA    Pamela Mejia is a 87 y.o. female.  HPI Patient presents with weakness, cough, dyspnea. She notes that she has had pneumonia, but until last 2 or 3 days was doing better..  Now, over that time patient has had worsening dyspnea, fatigue, and spite of taking her medication as directed. Her history includes asthma, but she is not a smoker. She does not wear home oxygen. No fever, vomiting.  Patient recently had a course of Levaquin, was admitted for bronchitis.  No relief at home with Advair.    Past Medical History:  Diagnosis Date  . Anemia   . Arthritis   . Asthma   . Blind right eye   . Blood clotting disorder ([place])   . Cancer ([place])    Ocular melanoma  . Cataract    left  . Complication of anesthesia    has a soy allergy  . Diverticulitis   . Diverticulosis   . Gallstones   . GERD (gastroesophageal reflux disease)   . Hypertension   . Hypothyroidism   . Inguinal hernia    left  . Kidney stones   . Status post dilation of esophageal narrowing     Patient Active Problem List   Diagnosis Date Noted  . Pneumonia 07/29/2020  . CAP (community acquired pneumonia) 07/29/2020  . AKI (acute kidney injury) ([place]) 07/29/2020  . History of diverticulitis 09/23/2015  . Asthma, moderate persistent 02/03/2015  . Hypothyroidism 02/03/2015  . Essential hypertension 02/03/2015  . Peritonitis ([place]) 02/03/2015    Past Surgical History:  Procedure Laterality Date  . ABDOMINAL HYSTERECTOMY  1966  . BLADDER SURGERY  2008  . BREAST BIOPSY Bilateral   . BUNIONECTOMY Right   . CHOLECYSTECTOMY  1996      . COLONOSCOPY    . COLOSTOMY  01/2015  . COLOSTOMY TAKEDOWN  09/23/2015  . COLOSTOMY TAKEDOWN N/A 09/23/2015   Procedure: COLOSTOMY TAKEDOWN;  Surgeon: Stark Klein,  MD;  Location: [facility];  Service: General;  Laterality: N/A;  . DIAGNOSTIC LAPAROSCOPY  01/2015   sigmoid colectomy with takedown of splenic flexure and end colostomy   . EYE SURGERY  1994   radioactive plaque  . HERNIA REPAIR  09/23/2015   PARASTOMAL   . LAPAROSCOPY N/A 02/03/2015   Procedure: DIAGNOSTIC LAPAROSCOPY/OPEN SIGMOID COLECTOMY WITH TAKE DOWN OF SPLENIC FLEXURE AND END COLOSTOMY;  Surgeon: Stark Klein, MD;  Location: [facility];  Service: General;  Laterality: N/A;  . PARASTOMAL HERNIA REPAIR N/A 09/23/2015   Procedure: HERNIA REPAIR PARASTOMAL;  Surgeon: Stark Klein, MD;  Location: [facility];  Service: General;  Laterality: N/A;  . throat biopsy     "had stones in my throat; had them in alot of places"  . TRIGGER FINGER RELEASE       OB History   No obstetric history on file.     Family History  Problem Relation Age of Onset  . Hypertension Mother   . Alzheimer's disease Mother   . Stroke Father   . Stroke Daughter   . Diabetes Son   . Colon cancer Neg Hx     Social History   Tobacco Use  . Smoking status: Never Smoker  . Smokeless tobacco: Never Used  Substance Use Topics  . Alcohol  use: No    Alcohol/week: 0.0 standard drinks  . Drug use: No    Home Medications Prior to Admission medications   Medication Sig Start Date End Date Taking? Authorizing Provider  ADVAIR DISKUS 250-50 MCG/ACT AEPB Inhale 1 puff into the lungs 2 (two) times daily. 08/16/20  Yes [provider]  amLODipine (NORVASC) 5 MG tablet Take 5 mg by mouth daily.   Yes [provider]  levothyroxine (SYNTHROID) 50 MCG tablet Take 50 mcg by mouth daily before breakfast.   Yes [provider]  losartan (COZAAR) 100 MG tablet Take 100 mg by mouth daily.   Yes [provider]  Multiple Vitamins-Minerals (PRESERVISION AREDS PO) Take 1 tablet by mouth daily.   Yes [provider]  simvastatin (ZOCOR) 20 MG tablet Take 20 mg by mouth daily.   Yes [provider]  benzonatate (TESSALON) 100 MG capsule Take 1 capsule (100 mg total) by mouth 3 (three) times daily. Patient not taking: Reported on 08/14/2020 07/30/20   Pamela Hamman, MD  guaiFENesin (ROBITUSSIN) 100 MG/5ML SOLN Take 10 mLs (200 mg total) by mouth 4 (four) times daily. Patient not taking: Reported on 07/29/2020 07/30/20   Pamela Hamman, MD  HYDROcodone-acetaminophen (NORCO/VICODIN) 5-325 MG tablet Take 1-2 tablets by mouth every 4 (four) hours as needed for moderate pain. Patient not taking: Reported on 07/27/2020 09/27/15   Stark Klein, MD  predniSONE (DELTASONE) 10 MG tablet Take 40mg  daily for 3days,Take 30mg  daily for 3days,Take 20mg  daily for 3days,Take 10mg  daily for 3days, then stop Patient not taking: Reported on 08/18/2020 07/30/20   Pamela Hamman, MD    Allergies    Eggs or egg-derived products, Penicillins, Soy allergy, and Tylenol with codeine #3 [acetaminophen-codeine]  Review of Systems   Review of Systems  Constitutional:       Per HPI, otherwise negative  HENT:       Per HPI, otherwise negative  Respiratory:       Per HPI, otherwise negative  Cardiovascular:       Per HPI, otherwise negative  Gastrointestinal: Negative for vomiting.  Endocrine:       Negative aside from HPI  Genitourinary:       Neg aside from HPI   Musculoskeletal:       Per HPI, otherwise negative  Skin: Negative.   Neurological: Positive for weakness. Negative for syncope.    Physical Exam Updated Vital Signs BP 140/75   Pulse 84   Temp 97.8 F (36.6 C) (Oral)   Resp (!) 35   Ht 5' (1.524 m)   Wt 53.8 kg   SpO2 99%   BMI 23.16 kg/m   Physical Exam Vitals and nursing note reviewed.  Constitutional:      Appearance: She is ill-appearing.  HENT:     Head: Normocephalic and atraumatic.  Eyes:     Conjunctiva/sclera: Conjunctivae normal.  Cardiovascular:     Rate and Rhythm: Regular rhythm. Tachycardia present.  Pulmonary:     Effort: Pulmonary effort is normal.  Tachypnea present. No respiratory distress.     Breath sounds: No stridor. Decreased breath sounds and wheezing present.  Abdominal:     General: There is no distension.  Skin:    General: Skin is warm and dry.  Neurological:     Mental Status: She is alert and oriented to person, place, and time.     Cranial Nerves: No cranial nerve deficit.      ED Results /  Procedures / Treatments   Labs (all labs ordered are listed, but only abnormal results are displayed) Labs Reviewed  COMPREHENSIVE METABOLIC PANEL - Abnormal; Notable for the following components:      Result Value   BUN 36 (*)    Creatinine, Ser 1.72 (*)    Albumin 2.9 (*)    AST 61 (*)    Alkaline Phosphatase 141 (*)    GFR, Estimated 28 (*)    All other components within normal limits  CBC WITH DIFFERENTIAL/PLATELET - Abnormal; Notable for the following components:   Hemoglobin 11.8 (*)    Abs Immature Granulocytes 0.50 (*)    All other components within normal limits  BRAIN NATRIURETIC PEPTIDE  URINALYSIS, ROUTINE W REFLEX MICROSCOPIC  POC SARS CORONAVIRUS 2 AG -  ED    Radiology DG Chest Port 1 View  Result Date: 08/06/2020 CLINICAL DATA:  Shortness of breath EXAM: PORTABLE CHEST 1 VIEW COMPARISON:  Chest radiograph and chest CT Jul 28, 2020 FINDINGS: Airspace opacity is noted in the right base region. Lungs elsewhere are clear. Heart is mildly enlarged with pulmonary vascularity normal. There is a sizable hiatal type hernia. No adenopathy. There is aortic atherosclerosis. No bone lesions. IMPRESSION: Airspace opacity concerning for pneumonia right base. No similar changes elsewhere. Mild cardiac enlargement is stable. Sizable hiatal hernia noted. Aortic Atherosclerosis (ICD10-I70.0). Electronically Signed   By: Lowella Grip III M.D.   On: 08/22/2020 12:45    Procedures Procedures   Medications Ordered in ED Medications  aztreonam (AZACTAM) 2 g in sodium chloride 0.9 % 100 mL IVPB (has no administration in  time range)  albuterol (PROVENTIL) (2.5 MG/3ML) 0.083% nebulizer solution 5 mg (5 mg Nebulization Given 08/09/2020 1227)  sodium chloride 0.9 % bolus 1,000 mL (1,000 mLs Intravenous New Bag/Given 08/02/2020 1416)  vancomycin (VANCOCIN) IVPB 1000 mg/200 mL premix (1,000 mg Intravenous New Bag/Given 08/23/2020 1419)    ED Course  I have reviewed the triage vital signs and the nursing notes.  Pertinent labs & imaging results that were available during my care of the patient were reviewed by me and considered in my medical decision making (see chart for details).  Chart review notable for discharge from this facility 2 weeks ago following episode for bronchitis.  Patient did receive antibiotics, steroid taper on discharge. On initial evaluation the patient is tachypneic, with increased work of breathing, has tachycardia, rate 110, sinus, abnormal on monitor, pulse oximetry is 95%, which is borderline, but given her increased work of breathing, tachypnea, patient will start supplemental oxygen, 2 L, 98%.  3:26 PM Patient aware of need for admission.  X-ray reviewed, discussed, concerning for pneumonia given recent admission for bronchitis, she meets criteria for healthcare acquired pneumonia. Patient has some evidence for renal dysfunction concurrently, likely contributing to her tachycardia, tachypnea and increased work of breathing. Patient meets SIRS criteria, has had initiation of antibiotics without complication, and I discussed her case with our internal for admission. MDM Rules/Calculators/A&P MDM Number of Diagnoses or Management Options HCAP (healthcare-associated pneumonia): new, needed workup Renal dysfunction: new, needed workup SIRS (systemic inflammatory response syndrome) ([place]): new, needed workup   Amount and/or Complexity of Data Reviewed Clinical lab tests: reviewed and ordered Tests in the radiology section of CPT: ordered and reviewed Tests in the medicine section of CPT: ordered  and reviewed Decide to obtain previous medical records or to obtain history from someone other than the patient: yes Review and summarize past medical records: yes Discuss the patient with  other providers: yes Independent visualization of images, tracings, or specimens: yes  Risk of Complications, Morbidity, and/or Mortality Presenting problems: high Diagnostic procedures: high Management options: high  Critical Care Total time providing critical care: 30-74 minutes (35)  Patient Progress Patient progress: stable  Final Clinical Impression(s) / ED Diagnoses Final diagnoses:  HCAP (healthcare-associated pneumonia)  SIRS (systemic inflammatory response syndrome) ([place])  Renal dysfunction      Carmin Muskrat, MD 08/20/2020 1527

## 2020-08-24 NOTE — Progress Notes (Signed)
A consult was received from an ED physician for vancomycin per pharmacy dosing.  The patient's profile has been reviewed for ht/wt/allergies/indication/available labs.    A one time order has been placed for vancomycin 1000 mg IV x1.  Further antibiotics/pharmacy consults should be ordered by admitting physician if indicated.                       Thank you, Lynelle Doctor 08/18/2020  1:42 PM

## 2020-08-24 NOTE — ED Notes (Signed)
Pt placed on 2L [place] for comfort.

## 2020-08-24 NOTE — ED Notes (Signed)
Report sent to floor nurse.  

## 2020-08-24 NOTE — ED Triage Notes (Signed)
Pt presents from [facility] assisted [facility] for PNA and SOB. Per EMS pt was diagnosed with PNA on 08/03/20. Pt was has completed all tx for this and was seen in the ED one week ago and prescribed a Z-pak which she has also finished with continued SOB.

## 2020-08-24 NOTE — Progress Notes (Signed)
A consult was received from an ED physician for vancomycin per pharmacy dosing.  The patient's profile has been reviewed for ht/wt/allergies/indication/available labs.   A one time order has been placed for vancomycin 1000 mg.  Further antibiotics/pharmacy consults should be ordered by admitting physician if indicated.                       Thank you, Efraim Kaufmann, PharmD, BCPS 08/08/2020  1:44 PM

## 2020-08-24 NOTE — ED Notes (Signed)
Provider notified of pt VS and pt is a difficult stick. IV team consult placed.

## 2020-08-24 NOTE — H&P (Signed)
History and Physical    Pamela Mejia TWS:[id number] DOB: [birth year] DOA: 08/23/2020  PCP: Pamela Dy, MD  Patient coming from: ALF  Chief Complaint: cough  HPI: Pamela Mejia is a 87 y.o. female with medical history significant of HTN, hypothyroidism, HLD. Presenting with cough and shortness of breath. She has had a cough now for several weeks. It's productive. She is not able to effectively clear it, however. She came to the hospital about this concern earlier this month. She was treated for bronchitis. She was treated with prednisone and doxy. She seemed to had some slight improvement. However, her symptoms never truly went away. She has slowly declined to the point where she is finding it difficult to catch her breath. She decided to come back to the hospital for assistance. She denies any other aggravating or alleviating factors.    ED Course: She was found to be hypoxic on RA. Started on 2L [place]. CXR was concerning for PNA. She was started on HCAP coverage. TRH was called for admission.   Review of Systems:  Review of systems is otherwise negative for all not mentioned in HPI.   PMHx Past Medical History:  Diagnosis Date  . Anemia   . Arthritis   . Asthma   . Blind right eye   . Blood clotting disorder ([place])   . Cancer ([place])    Ocular melanoma  . Cataract    left  . Complication of anesthesia    has a soy allergy  . Diverticulitis   . Diverticulosis   . Gallstones   . GERD (gastroesophageal reflux disease)   . Hypertension   . Hypothyroidism   . Inguinal hernia    left  . Kidney stones   . Status post dilation of esophageal narrowing     PSHx Past Surgical History:  Procedure Laterality Date  . ABDOMINAL HYSTERECTOMY  1966  . BLADDER SURGERY  2008  . BREAST BIOPSY Bilateral   . BUNIONECTOMY Right   . CHOLECYSTECTOMY  1996      . COLONOSCOPY    . COLOSTOMY  01/2015  . COLOSTOMY TAKEDOWN  09/23/2015  . COLOSTOMY TAKEDOWN N/A 09/23/2015   Procedure: COLOSTOMY  TAKEDOWN;  Surgeon: Stark Klein, MD;  Location: [place] [facility];  Service: General;  Laterality: N/A;  . DIAGNOSTIC LAPAROSCOPY  01/2015   sigmoid colectomy with takedown of splenic flexure and end colostomy   . EYE SURGERY  1994   radioactive plaque  . HERNIA REPAIR  09/23/2015   PARASTOMAL   . LAPAROSCOPY N/A 02/03/2015   Procedure: DIAGNOSTIC LAPAROSCOPY/OPEN SIGMOID COLECTOMY WITH TAKE DOWN OF SPLENIC FLEXURE AND END COLOSTOMY;  Surgeon: Stark Klein, MD;  Location: [facility];  Service: General;  Laterality: N/A;  . PARASTOMAL HERNIA REPAIR N/A 09/23/2015   Procedure: HERNIA REPAIR PARASTOMAL;  Surgeon: Stark Klein, MD;  Location: [place];  Service: General;  Laterality: N/A;  . throat biopsy     "had stones in my throat; had them in alot of places"  . TRIGGER FINGER RELEASE      SocHx  reports that she has never smoked. She has never used smokeless tobacco. She reports that she does not drink alcohol and does not use drugs.  Allergies  Allergen Reactions  . Eggs Or Egg-Derived Products Shortness Of Breath  . Penicillins Shortness Of Breath and Rash    Patient is unable to answer PCN questions at this time. Has patient had a PCN reaction causing immediate rash, facial/tongue/throat swelling, SOB or lightheadedness with  hypotension: Yes Has patient had a PCN reaction causing severe rash involving mucus membranes or skin necrosis: No Has patient had a PCN reaction that required hospitalization Yes Has patient had a PCN reaction occurring within the last 10 years: No If all of the above answers are "NO", then may proceed with Cephalosporin use.    . Soy Allergy Hives  . Tylenol With Codeine #3 [Acetaminophen-Codeine]     hyper    FamHx Family History  Problem Relation Age of Onset  . Hypertension Mother   . Alzheimer's disease Mother   . Stroke Father   . Stroke Daughter   . Diabetes Son   . Colon cancer Neg Hx     Prior to Admission medications   Medication Sig Start Date End Date  Taking? Authorizing Provider  amLODipine (NORVASC) 5 MG tablet Take 5 mg by mouth daily.   Yes [provider]  levothyroxine (SYNTHROID) 50 MCG tablet Take 50 mcg by mouth daily before breakfast.   Yes [provider]  losartan (COZAAR) 100 MG tablet Take 100 mg by mouth daily.   Yes [provider]  simvastatin (ZOCOR) 20 MG tablet Take 20 mg by mouth daily.   Yes [provider]  ADVAIR DISKUS 250-50 MCG/ACT AEPB Inhale 1 puff into the lungs 2 (two) times daily. 08/16/20   [provider]  benzonatate (TESSALON) 100 MG capsule Take 1 capsule (100 mg total) by mouth 3 (three) times daily. 07/30/20   Pamela Hamman, MD  guaiFENesin (ROBITUSSIN) 100 MG/5ML SOLN Take 10 mLs (200 mg total) by mouth 4 (four) times daily. 07/30/20   Pamela Hamman, MD  HYDROcodone-acetaminophen (NORCO/VICODIN) 5-325 MG tablet Take 1-2 tablets by mouth every 4 (four) hours as needed for moderate pain. 09/27/15   Stark Klein, MD  Multiple Vitamins-Minerals (PRESERVISION AREDS PO) Take 1 tablet by mouth daily.    [provider]  predniSONE (DELTASONE) 10 MG tablet Take 40mg  daily for 3days,Take 30mg  daily for 3days,Take 20mg  daily for 3days,Take 10mg  daily for 3days, then stop 07/30/20   Pamela Hamman, MD    Physical Exam: Vitals:   08/23/2020 1315 08/11/2020 1330 08/01/2020 1332 08/05/2020 1333  BP: 132/73 123/71    Pulse: (!) 104 (!) 106 (!) 105 (!) 105  Resp: (!) 23 (!) 33 (!) 30 (!) 29  Temp:      TempSrc:      SpO2: 100% 98% 98% 98%  Weight:      Height:        General: 87 y.o. female resting in bed in NAD Eyes: PERRL, normal sclera ENMT: Nares patent w/o discharge, orophaynx clear, dentition normal, ears w/o discharge/lesions/ulcers Neck: Supple, trachea midline Cardiovascular: RRR, +S1, S2, no m/g/r, equal pulses throughout Respiratory: UAT transmission, w/ some expiratory wheeze and rhonchi on right GI: BS+, NDNT, no masses noted, no organomegaly  noted MSK: No e/c/c Skin: No rashes, bruises, ulcerations noted Neuro: A&O x 3, no focal deficits Psyc: Appropriate interaction and affect, calm/cooperative  Labs on Admission: I have personally reviewed following labs and imaging studies  CBC: Recent Labs  Lab 07/30/2020 1233  WBC 9.8  NEUTROABS 6.7  HGB 11.8*  HCT 37.6  MCV 94.5  PLT 191   Basic Metabolic Panel: Recent Labs  Lab 08/23/2020 1233  NA 143  K 4.2  CL 108  CO2 22  GLUCOSE 99  BUN 36*  CREATININE 1.72*  CALCIUM 9.7   GFR: Estimated Creatinine Clearance: 15.3 mL/min (A) (by  C-G formula based on SCr of 1.72 mg/dL (H)). Liver Function Tests: Recent Labs  Lab 08/23/2020 1233  AST 61*  ALT 27  ALKPHOS 141*  BILITOT 0.9  PROT 6.9  ALBUMIN 2.9*   No results for input(s): LIPASE, AMYLASE in the last 168 hours. No results for input(s): AMMONIA in the last 168 hours. Coagulation Profile: No results for input(s): INR, PROTIME in the last 168 hours. Cardiac Enzymes: No results for input(s): CKTOTAL, CKMB, CKMBINDEX, TROPONINI in the last 168 hours. BNP (last 3 results) No results for input(s): PROBNP in the last 8760 hours. HbA1C: No results for input(s): HGBA1C in the last 72 hours. CBG: No results for input(s): GLUCAP in the last 168 hours. Lipid Profile: No results for input(s): CHOL, HDL, LDLCALC, TRIG, CHOLHDL, LDLDIRECT in the last 72 hours. Thyroid Function Tests: No results for input(s): TSH, T4TOTAL, FREET4, T3FREE, THYROIDAB in the last 72 hours. Anemia Panel: No results for input(s): VITAMINB12, FOLATE, FERRITIN, TIBC, IRON, RETICCTPCT in the last 72 hours. Urine analysis:    Component Value Date/Time   COLORURINE YELLOW 09/17/2015 [place] 09/17/2015 1406   LABSPEC 1.021 09/17/2015 1406   PHURINE 5.0 09/17/2015 1406   GLUCOSEU NEGATIVE 09/17/2015 1406   [place] 09/17/2015 1406   [place] 09/17/2015 1406   [place] 09/17/2015 1406   PROTEINUR  NEGATIVE 09/17/2015 1406   UROBILINOGEN 1.0 02/03/2015 1020   NITRITE NEGATIVE 09/17/2015 1406   LEUKOCYTESUR TRACE (A) 09/17/2015 1406    Radiological Exams on Admission: DG Chest Port 1 View  Result Date: 08/21/2020 CLINICAL DATA:  Shortness of breath EXAM: PORTABLE CHEST 1 VIEW COMPARISON:  Chest radiograph and chest CT Jul 28, 2020 FINDINGS: Airspace opacity is noted in the right base region. Lungs elsewhere are clear. Heart is mildly enlarged with pulmonary vascularity normal. There is a sizable hiatal type hernia. No adenopathy. There is aortic atherosclerosis. No bone lesions. IMPRESSION: Airspace opacity concerning for pneumonia right base. No similar changes elsewhere. Mild cardiac enlargement is stable. Sizable hiatal hernia noted. Aortic Atherosclerosis (ICD10-I70.0). Electronically Signed   By: Lowella Grip III M.D.   On: 08/16/2020 12:45    EKG: None obtained in ED  Assessment/Plan RLL PNA Hx of COPD?     - admit to inpt, med surg     - check sputum culture, urine legionella/strep     - continue broad spec abx     - could be have some aspiration? Will ask SLP to eval     - IS, flutter, guaifenesin     - continue home inhalers  AKI     - fluids, follow  HTN     - continue norvasc, hold cozaar  Hypothyroidism     - continue synthroid  HLD     - continue statin  DVT prophylaxis: SCDs  Code Status: FULL  Family Communication: None at bedside  Consults called: None  Status is: Inpatient  Remains inpatient appropriate because:Inpatient level of care appropriate due to severity of illness   Dispo: The patient is from: SNF              Anticipated d/c is to: SNF              Patient currently is not medically stable to d/c.   Difficult to place patient No  Time spent coordinating admission: 70 minutes  [facility]  If 7PM-7AM, please contact night-coverage [email or web address]  08/15/2020, 2:50 PM

## 2020-08-24 NOTE — Progress Notes (Signed)
Spoke w/RN Susie (charge).  Will attempt PIV and will put in new consult if unable to obtain access.

## 2020-08-24 NOTE — ED Notes (Signed)
Pt placed on purewick 

## 2020-08-25 LAB — CBC
HCT: 29.6 % — ABNORMAL LOW (ref 36.0–46.0)
Hemoglobin: 9.3 g/dL — ABNORMAL LOW (ref 12.0–15.0)
MCH: 29.5 pg (ref 26.0–34.0)
MCHC: 31.4 g/dL (ref 30.0–36.0)
MCV: 94 fL (ref 80.0–100.0)
Platelets: 243 10*3/uL (ref 150–400)
RBC: 3.15 MIL/uL — ABNORMAL LOW (ref 3.87–5.11)
RDW: 13.4 % (ref 11.5–15.5)
WBC: 8.8 10*3/uL (ref 4.0–10.5)
nRBC: 0 % (ref 0.0–0.2)

## 2020-08-25 LAB — COMPREHENSIVE METABOLIC PANEL
ALT: 23 U/L (ref 0–44)
AST: 62 U/L — ABNORMAL HIGH (ref 15–41)
Albumin: 2.3 g/dL — ABNORMAL LOW (ref 3.5–5.0)
Alkaline Phosphatase: 110 U/L (ref 38–126)
Anion gap: 8 (ref 5–15)
BUN: 32 mg/dL — ABNORMAL HIGH (ref 8–23)
CO2: 21 mmol/L — ABNORMAL LOW (ref 22–32)
Calcium: 8.6 mg/dL — ABNORMAL LOW (ref 8.9–10.3)
Chloride: 113 mmol/L — ABNORMAL HIGH (ref 98–111)
Creatinine, Ser: 1.26 mg/dL — ABNORMAL HIGH (ref 0.44–1.00)
GFR, Estimated: 40 mL/min — ABNORMAL LOW (ref >60)
Glucose, Bld: 92 mg/dL (ref 70–99)
Potassium: 3.6 mmol/L (ref 3.5–5.1)
Sodium: 142 mmol/L (ref 135–145)
Total Bilirubin: 0.8 mg/dL (ref 0.3–1.2)
Total Protein: 5.4 g/dL — ABNORMAL LOW (ref 6.5–8.1)

## 2020-08-25 LAB — STREP PNEUMONIAE URINARY ANTIGEN: Strep Pneumo Urinary Antigen: NEGATIVE

## 2020-08-25 MED ORDER — PANTOPRAZOLE SODIUM 40 MG PO TBEC
40.0000 mg | DELAYED_RELEASE_TABLET | Freq: Every day | ORAL | Status: DC
  Administered 2020-08-25 – 2020-09-02 (×7): 40 mg via ORAL
  Filled 2020-08-25 (×7): qty 1

## 2020-08-25 MED ORDER — SODIUM CHLORIDE 0.9 % IV SOLN
2.0000 g | INTRAVENOUS | Status: DC
  Administered 2020-08-25 – 2020-08-28 (×4): 2 g via INTRAVENOUS
  Filled 2020-08-25 (×5): qty 2

## 2020-08-25 MED ORDER — KATE FARMS STANDARD 1.4 PO LIQD
325.0000 mL | Freq: Two times a day (BID) | ORAL | Status: DC
  Administered 2020-08-25 – 2020-08-31 (×10): 325 mL via ORAL
  Filled 2020-08-25 (×14): qty 325

## 2020-08-25 MED ORDER — ENOXAPARIN SODIUM 30 MG/0.3ML IJ SOSY
30.0000 mg | PREFILLED_SYRINGE | INTRAMUSCULAR | Status: DC
  Administered 2020-08-25 – 2020-08-28 (×4): 30 mg via SUBCUTANEOUS
  Filled 2020-08-25 (×4): qty 0.3

## 2020-08-25 NOTE — Evaluation (Signed)
Clinical/Bedside Swallow Evaluation Patient Details  Name: Pamela Mejia MRN: [id number] Date of Birth: Apr 04, 1926  Today's Date: 08/25/2020 Time: SLP Start Time (ACUTE ONLY): 36 SLP Stop Time (ACUTE ONLY): 1005 SLP Time Calculation (min) (ACUTE ONLY): 45 min  Past Medical History:  Past Medical History:  Diagnosis Date  . Anemia   . Arthritis   . Asthma   . Blind right eye   . Blood clotting disorder ([place])   . Cancer ([place])    Ocular melanoma  . Cataract    left  . Complication of anesthesia    has a soy allergy  . Diverticulitis   . Diverticulosis   . Gallstones   . GERD (gastroesophageal reflux disease)   . Hypertension   . Hypothyroidism   . Inguinal hernia    left  . Kidney stones   . Status post dilation of esophageal narrowing    Past Surgical History:  Past Surgical History:  Procedure Laterality Date  . ABDOMINAL HYSTERECTOMY  1966  . BLADDER SURGERY  2008  . BREAST BIOPSY Bilateral   . BUNIONECTOMY Right   . CHOLECYSTECTOMY  1996      . COLONOSCOPY    . COLOSTOMY  01/2015  . COLOSTOMY TAKEDOWN  09/23/2015  . COLOSTOMY TAKEDOWN N/A 09/23/2015   Procedure: COLOSTOMY TAKEDOWN;  Surgeon: Stark Klein, MD;  Location: [facility];  Service: General;  Laterality: N/A;  . DIAGNOSTIC LAPAROSCOPY  01/2015   sigmoid colectomy with takedown of splenic flexure and end colostomy   . EYE SURGERY  1994   radioactive plaque  . HERNIA REPAIR  09/23/2015   PARASTOMAL   . LAPAROSCOPY N/A 02/03/2015   Procedure: DIAGNOSTIC LAPAROSCOPY/OPEN SIGMOID COLECTOMY WITH TAKE DOWN OF SPLENIC FLEXURE AND END COLOSTOMY;  Surgeon: Stark Klein, MD;  Location: [facility];  Service: General;  Laterality: N/A;  . PARASTOMAL HERNIA REPAIR N/A 09/23/2015   Procedure: HERNIA REPAIR PARASTOMAL;  Surgeon: Stark Klein, MD;  Location: [place];  Service: General;  Laterality: N/A;  . throat biopsy     "had stones in my throat; had them in alot of places"  . TRIGGER FINGER RELEASE     HPI:  Per MD notes "87  y.o. female with medical history significant of HTN, hypothyroidism, HLD. Presenting with cough and shortness of breath. She has had a cough now for several weeks. It's productive. She is not able to effectively clear it, however. She came to the hospital about this concern earlier this month. She was treated for bronchitis, large gastric hernia esoph negative per recent CT chest."  Swallow evaluation ordered.   Assessment / Plan / Recommendation Clinical Impression  Pt with no focal CN deficits and cognition appears remarkable.  She has h/o large hiatal hernia - which she advises has been monitored in the past but she states that surgery is not an option due to her ago.   Breakfast consumption including a small bite of chicken sausage and 4 ounces of orange juice.  Pt observed consuming water, applesauce boluses x3, cheddar cheese boluses x2.  Multiple effortful swallows noted with pt wincing - reporting odynophagia.  Baseline cough present but also exacerbated with intake.  Pt reports progressive problems with "getting food/drink down", sensing lodging in throat *pointing both toward vallecular but also pyriform sinus area* and getting choked frequently.    Dysphagia symptoms are not consistent and she recently consumed tilapia and french fries without problems per son/pt- again pointing to suspected esophageal deficits  She did not pass  the 3 ounce [place] water test due to requiring rest break.   Mild dyspnea noted at rest - and exacerbated with intake.  Pt masticates food thoroughly and eats slowly to which maximizes her comfort/airway protection.    SLP educated pt that SLP can not rule out pharyngeal component of swallow - but given h/o hiatal hernia- suspect esophageal primary.  Pt also belching during intake and admits to actively refluxing but does not take her Prilosec 40 mg every day due to concern for constipation. Advised she speak to MD re: said concerns.    Offered further instrumental  testing to assess oropharyngeal phase vs continue diet with mitigation strategies reviewed.  Pt declined furhter evaluation and son was present during conversation.  SLP reviewed advise to drink water, try small frequent meals, recommendation to contact [facility] to educate them to her dysphagia in case she has aspiration episode and importance of maintaining strength of cough/expectoration for airway protection.   No SLP follow up indicated as pt has been educated to compensation strategies and does not desire further work up - pt and son agreeable.  H/O esophageal dilatation noted per chart review.    Thanks for this consult. SLP Visit Diagnosis: Dysphagia, unspecified (R13.10)    Aspiration Risk  Mild aspiration risk    Diet Recommendation Regular   Liquid Administration via: Cup;Straw Medication Administration: Other (Comment) (defer to pt) Supervision: Patient able to self feed Compensations: Slow rate;Small sips/bites Postural Changes: Seated upright at 90 degrees;Remain upright for at least 30 minutes after po intake    Other  Recommendations Oral Care Recommendations: Oral care BID   Follow up Recommendations None      Frequency and Duration            Prognosis        Swallow Study   General Date of Onset: 08/25/20 HPI: Per MD notes "87 y.o. female with medical history significant of HTN, hypothyroidism, HLD. Presenting with cough and shortness of breath. She has had a cough now for several weeks. It's productive. She is not able to effectively clear it, however. She came to the hospital about this concern earlier this month. She was treated for bronchitis, large gastric hernia esoph negative per recent CT chest."  Swallow evaluation ordered. Type of Study: Bedside Swallow Evaluation Diet Prior to this Study: Regular;Thin liquids Temperature Spikes Noted: No Respiratory Status: Nasal cannula History of Recent Intubation: No Behavior/Cognition: Alert;Cooperative;Pleasant  mood Oral Cavity Assessment: Within Functional Limits Oral Care Completed by SLP: No Oral Cavity - Dentition: Dentures, top;Dentures, bottom Vision: Functional for self-feeding Self-Feeding Abilities: Able to feed self Patient Positioning: Upright in bed Baseline Vocal Quality: Other (comment) (slightly hoarse) Volitional Cough: Weak Volitional Swallow: Able to elicit    Oral/Motor/Sensory Function Overall Oral Motor/Sensory Function: Within functional limits   Ice Chips Ice chips: Not tested   Thin Liquid Thin Liquid: Impaired Pharyngeal  Phase Impairments: Multiple swallows;Cough - Delayed Other Comments: pt did not pass the 3 ounce [place] water screen due to requiring rest break    Nectar Thick Nectar Thick Liquid: Not tested   Honey Thick Honey Thick Liquid: Not tested   Puree Puree: Impaired Presentation: Self Fed;Spoon Pharyngeal Phase Impairments: Multiple swallows   Solid     Solid: Impaired Presentation: Self Fed Pharyngeal Phase Impairments: Multiple swallows      Macario Golds 08/25/2020,10:43 AM   Kathleen Lime, MS [place] Office [phone] Pager [phone]

## 2020-08-25 NOTE — Progress Notes (Addendum)
Initial Nutrition Assessment  DOCUMENTATION CODES:   Non-severe (moderate) malnutrition in context of acute illness/injury  INTERVENTION:   -Recommend diet liberalization to regular  -[employer] 1.4 PO BID, each provides 455 kcals and 20g protein  NUTRITION DIAGNOSIS:   Moderate Malnutrition related to acute illness (CAP) as evidenced by moderate fat depletion,moderate muscle depletion.  GOAL:   Patient will meet greater than or equal to 90% of their needs  MONITOR:   PO intake,Supplement acceptance,Labs,Weight trends,I & O's  REASON FOR ASSESSMENT:   Malnutrition Screening Tool    ASSESSMENT:   87 year old female with history of hypertension, hypothyroidism, had hiatal hernia presented to the ED with cough and shortness of breath, ongoing for several weeks, productive.  -She was recently treated a month ago for bronchitis with prednisone and doxycycline.  Patient in room, reports she does not like her restricted diet. Was told she could not have orange juice and other items this morning d/t heart healthy diet. Pt reports she has had consistent cough for weeks PTA, does not report any vomiting or regurgitation.  Pt with soy and egg allergies. Will order allergy friendly [employer] vanilla supplement for additional kcals and protein.  Per SLP evaluation today, recommending regular consistency diet.  Per weight records, last recorded weight PTA was from 2017.   Medications reviewed.  Labs reviewed.  NUTRITION - FOCUSED PHYSICAL EXAM:  Flowsheet Row Most Recent Value  Orbital Region Mild depletion  Upper Arm Region Moderate depletion  Thoracic and Lumbar Region Unable to assess  Buccal Region Moderate depletion  Temple Region Moderate depletion  Clavicle Bone Region Severe depletion  Clavicle and Acromion Bone Region Moderate depletion  Scapular Bone Region Moderate depletion  Dorsal Hand Moderate depletion  Patellar Region Unable to assess  Anterior Thigh  Region Unable to assess  Posterior Calf Region Unable to assess  Edema (RD Assessment) None       Diet Order:   Diet Order            Diet Heart Room service appropriate? Yes; Fluid consistency: Thin  Diet effective now                 EDUCATION NEEDS:   No education needs have been identified at this time  Skin:  Skin Assessment: Reviewed RN Assessment  Last BM:  5/30  Height:   Ht Readings from Last 1 Encounters:  07/27/2020 5' (1.524 m)    Weight:   Wt Readings from Last 1 Encounters:  07/26/2020 53.8 kg    BMI:  Body mass index is 23.16 kg/m.  Estimated Nutritional Needs:   Kcal:  1350-1550  Protein:  65-75g  Fluid:  1.5L/day   Clayton Bibles, MS, RD, LDN Inpatient Clinical Dietitian Contact information available via Amion

## 2020-08-25 NOTE — Progress Notes (Signed)
PT refused Flutter device- states she has one at home. RT encouraged IS usage and to deep breathe and cough regularly. RN aware.

## 2020-08-25 NOTE — Progress Notes (Signed)
PROGRESS NOTE    Pamela Mejia  RKY:[id number] DOB: [birth year] DOA: 07/28/2020 PCP: Lorene Dy, MD  Brief Narrative: 87 year old female with history of hypertension, hypothyroidism, had hiatal hernia presented to the ED with cough and shortness of breath, ongoing for several weeks, productive. -She was recently treated a month ago for bronchitis with prednisone and doxycycline. -In the ER she was noted to be hypoxic on room air, placed on oxygen and, chest x-ray is concerning for right lower lobe pneumonia, she was started on antibiotics   Assessment & Plan:   Right lower lobe pneumonia -Suspect aspiration, has history of hiatal hernia -Multiple antibiotic allergies however has tolerated cephalosporins in the past, will discontinue vancomycin, and aztreonam, start IV cefepime -Follow-up cultures -Mucinex, incentive spirometry -SLP evaluation  Acute kidney injury -Improving, ARB on hold  Hypertension -Continue Norvasc, hold Cozaar  Hypothyroidism -Continue Synthroid  DVT prophylaxis: Lovenox Code Status: DNR, discussed with the patient this morning, she tells me she is a DNR Family Communication: No family at bedside, called and left message for patient's son Tomeshia Pizzi Disposition Plan:  Status is: Inpatient  Remains inpatient appropriate because:Inpatient level of care appropriate due to severity of illness   Dispo: The patient is from: Home              Anticipated d/c is to: Home              Patient currently is not medically stable to d/c.   Difficult to place patient No        Consultants:      Procedures:   Antimicrobials:    Subjective: -Still with some cough, breathing is improving  Objective: Vitals:   08/13/2020 1650 07/28/2020 2137 08/25/20 0145 08/25/20 0651  BP: 140/74 (!) 144/79 (!) 152/60 (!) 177/77  Pulse: 79 79 63 60  Resp: 16 16 16 16   Temp: 97.6 F (36.4 C) 98 F (36.7 C) 98.1 F (36.7 C) 97.7 F (36.5 C)  TempSrc: Oral Oral  Oral   SpO2: 100% 97% 95% 99%  Weight:      Height:        Intake/Output Summary (Last 24 hours) at 08/25/2020 0956 Last data filed at 08/25/2020 0600 Gross per 24 hour  Intake 1480.34 ml  Output 350 ml  Net 1130.34 ml   Filed Weights   08/23/2020 1157  Weight: 53.8 kg    Examination:  General exam: Pleasant elderly female, sitting up in bed, awake alert oriented to self and place, partly to time, mild memory deficits CVS: S1-S2, regular rate rhythm Lungs: Scattered basilar rhonchi, no wheezing Abdomen: Soft, nontender, bowel sounds present Extremities: No edema Skin: No rash on exposed skin Psychiatry:  Mood & affect appropriate.     Data Reviewed:   CBC: Recent Labs  Lab 07/25/2020 1233 08/25/20 0426  WBC 9.8 8.8  NEUTROABS 6.7  --   HGB 11.8* 9.3*  HCT 37.6 29.6*  MCV 94.5 94.0  PLT 274 315   Basic Metabolic Panel: Recent Labs  Lab 08/03/2020 1233 08/25/20 0426  NA 143 142  K 4.2 3.6  CL 108 113*  CO2 22 21*  GLUCOSE 99 92  BUN 36* 32*  CREATININE 1.72* 1.26*  CALCIUM 9.7 8.6*   GFR: Estimated Creatinine Clearance: 20.9 mL/min (A) (by C-G formula based on SCr of 1.26 mg/dL (H)). Liver Function Tests: Recent Labs  Lab 07/25/2020 1233 08/25/20 0426  AST 61* 62*  ALT 27 23  ALKPHOS 141* 110  BILITOT 0.9  0.8  PROT 6.9 5.4*  ALBUMIN 2.9* 2.3*   No results for input(s): LIPASE, AMYLASE in the last 168 hours. No results for input(s): AMMONIA in the last 168 hours. Coagulation Profile: No results for input(s): INR, PROTIME in the last 168 hours. Cardiac Enzymes: No results for input(s): CKTOTAL, CKMB, CKMBINDEX, TROPONINI in the last 168 hours. BNP (last 3 results) No results for input(s): PROBNP in the last 8760 hours. HbA1C: No results for input(s): HGBA1C in the last 72 hours. CBG: No results for input(s): GLUCAP in the last 168 hours. Lipid Profile: No results for input(s): CHOL, HDL, LDLCALC, TRIG, CHOLHDL, LDLDIRECT in the last 72  hours. Thyroid Function Tests: No results for input(s): TSH, T4TOTAL, FREET4, T3FREE, THYROIDAB in the last 72 hours. Anemia Panel: No results for input(s): VITAMINB12, FOLATE, FERRITIN, TIBC, IRON, RETICCTPCT in the last 72 hours. Urine analysis:    Component Value Date/Time   COLORURINE YELLOW 08/06/2020 1728   APPEARANCEUR CLOUDY (A) 07/30/2020 1728   LABSPEC 1.013 08/05/2020 1728   PHURINE 5.0 08/07/2020 1728   GLUCOSEU NEGATIVE 08/20/2020 1728   HGBUR NEGATIVE 08/04/2020 1728   BILIRUBINUR NEGATIVE 08/21/2020 1728   KETONESUR 5 (A) 08/11/2020 1728   PROTEINUR NEGATIVE 08/05/2020 1728   UROBILINOGEN 1.0 02/03/2015 1020   NITRITE NEGATIVE 08/21/2020 1728   LEUKOCYTESUR MODERATE (A) 08/23/2020 1728   Sepsis Labs: @LABRCNTIP (procalcitonin:4,lacticidven:4)  ) Recent Results (from the past 240 hour(s))  MRSA PCR Screening     Status: None   Collection Time: 08/18/2020  5:28 PM   Specimen: Nasopharyngeal  Result Value Ref Range Status   MRSA by PCR NEGATIVE NEGATIVE Final    Comment:        The GeneXpert MRSA Assay (FDA approved for NASAL specimens only), is one component of a comprehensive MRSA colonization surveillance program. It is not intended to diagnose MRSA infection nor to guide or monitor treatment for MRSA infections. Performed at [facility], [place] [address], [place], [place] [address]          Radiology Studies: DG Chest Port 1 View  Result Date: 07/28/2020 CLINICAL DATA:  Shortness of breath EXAM: PORTABLE CHEST 1 VIEW COMPARISON:  Chest radiograph and chest CT Jul 28, 2020 FINDINGS: Airspace opacity is noted in the right base region. Lungs elsewhere are clear. Heart is mildly enlarged with pulmonary vascularity normal. There is a sizable hiatal type hernia. No adenopathy. There is aortic atherosclerosis. No bone lesions. IMPRESSION: Airspace opacity concerning for pneumonia right base. No similar changes elsewhere. Mild cardiac  enlargement is stable. Sizable hiatal hernia noted. Aortic Atherosclerosis (ICD10-I70.0). Electronically Signed   By: Lowella Grip III M.D.   On: 07/29/2020 12:45    Scheduled Meds: . amLODipine  5 mg Oral Daily  . guaiFENesin  600 mg Oral BID  . levothyroxine  50 mcg Oral Q0600  . mometasone-formoterol  2 puff Inhalation BID  . simvastatin  20 mg Oral Daily   Continuous Infusions: . ceFEPime (MAXIPIME) IV       LOS: 1 day    Time spent: 66min  Domenic Polite, MD [facility]   08/25/2020, 9:56 AM

## 2020-08-25 DEATH — deceased

## 2020-08-26 LAB — CBC WITH DIFFERENTIAL/PLATELET
Abs Immature Granulocytes: 1.34 10*3/uL — ABNORMAL HIGH (ref 0.00–0.07)
Basophils Absolute: 0 10*3/uL (ref 0.0–0.1)
Basophils Relative: 0 %
Eosinophils Absolute: 0.1 10*3/uL (ref 0.0–0.5)
Eosinophils Relative: 1 %
HCT: 30.2 % — ABNORMAL LOW (ref 36.0–46.0)
Hemoglobin: 9.7 g/dL — ABNORMAL LOW (ref 12.0–15.0)
Immature Granulocytes: 12 %
Lymphocytes Relative: 12 %
Lymphs Abs: 1.3 10*3/uL (ref 0.7–4.0)
MCH: 29.8 pg (ref 26.0–34.0)
MCHC: 32.1 g/dL (ref 30.0–36.0)
MCV: 92.6 fL (ref 80.0–100.0)
Monocytes Absolute: 1 10*3/uL (ref 0.1–1.0)
Monocytes Relative: 9 %
Neutro Abs: 7.5 10*3/uL (ref 1.7–7.7)
Neutrophils Relative %: 66 %
Platelets: 279 10*3/uL (ref 150–400)
RBC: 3.26 MIL/uL — ABNORMAL LOW (ref 3.87–5.11)
RDW: 13.4 % (ref 11.5–15.5)
WBC: 11.2 10*3/uL — ABNORMAL HIGH (ref 4.0–10.5)
nRBC: 0 % (ref 0.0–0.2)

## 2020-08-26 LAB — BASIC METABOLIC PANEL
Anion gap: 10 (ref 5–15)
BUN: 25 mg/dL — ABNORMAL HIGH (ref 8–23)
CO2: 20 mmol/L — ABNORMAL LOW (ref 22–32)
Calcium: 9.1 mg/dL (ref 8.9–10.3)
Chloride: 113 mmol/L — ABNORMAL HIGH (ref 98–111)
Creatinine, Ser: 1.04 mg/dL — ABNORMAL HIGH (ref 0.44–1.00)
GFR, Estimated: 51 mL/min — ABNORMAL LOW (ref >60)
Glucose, Bld: 75 mg/dL (ref 70–99)
Potassium: 3.6 mmol/L (ref 3.5–5.1)
Sodium: 143 mmol/L (ref 135–145)

## 2020-08-26 LAB — LEGIONELLA PNEUMOPHILA SEROGP 1 UR AG: L. pneumophila Serogp 1 Ur Ag: NEGATIVE

## 2020-08-26 NOTE — Evaluation (Signed)
Physical Therapy Evaluation Patient Details Name: Pamela Mejia MRN: [id number] DOB: 08/05/1926 Today's Date: 08/26/2020   History of Present Illness  87 year old female admitted with right lower lobe pneumonia.  Past medical history significant for hypertension, hypothyroidism, hiatal hernia, ocular melanoma with Right eye blindness, Left cataract  Clinical Impression  Pt admitted with above diagnosis.  Pt currently with functional limitations due to the deficits listed below (see PT Problem List). Pt will benefit from skilled PT to increase their independence and safety with mobility to allow discharge to the venue listed below.  Pt familiar to this PT from previous admission.  Pt able to ambulate previously and now significantly SOB and fatigued from performing sit to stands.  Pt does not feel she can return to ILF and agreeable to SNF for post acute rehab.      Follow Up Recommendations SNF    Equipment Recommendations  None recommended by PT    Recommendations for Other Services       Precautions / Restrictions Precautions Precautions: Fall      Mobility  Bed Mobility               General bed mobility comments: pt in recliner on arrival    Transfers Overall transfer level: Needs assistance Equipment used: Rolling walker (2 wheeled) Transfers: Sit to/from Stand Sit to Stand: Min assist         General transfer comment: performed sit to stand x3, cues for for weight shifting, pt required increased time, fatigues quickly, able to march in place approx 10 sec for 2 of the stands, SPO2 96% on 0.5L O2 [place] during session, pt reports significant dyspnea  Ambulation/Gait                Stairs            Wheelchair Mobility    Modified Rankin (Stroke Patients Only)       Balance                                             Pertinent Vitals/Pain Pain Assessment: No/denies pain    Home Living Family/patient expects to be  discharged to:: Skilled nursing facility Living Arrangements: Alone   Type of Home: Independent living facility         Home Equipment: Walker - 4 wheels      Prior Function Level of Independence: Independent with assistive device(s)         Comments: uses rollator for longer distances     Hand Dominance        Extremity/Trunk Assessment        Lower Extremity Assessment Lower Extremity Assessment: Generalized weakness    Cervical / Trunk Assessment Cervical / Trunk Assessment: Kyphotic  Communication   Communication: No difficulties  Cognition Arousal/Alertness: Awake/alert Behavior During Therapy: WFL for tasks assessed/performed Overall Cognitive Status: Within Functional Limits for tasks assessed                                        General Comments      Exercises     Assessment/Plan    PT Assessment Patient needs continued PT services  PT Problem List Decreased strength;Decreased mobility;Cardiopulmonary status limiting activity;Decreased balance;Decreased knowledge of use of DME;Decreased activity tolerance  PT Treatment Interventions Gait training;Therapeutic exercise;DME instruction;Balance training;Therapeutic activities;Functional mobility training;Patient/family education    PT Goals (Current goals can be found in the Care Plan section)  Acute Rehab PT Goals PT Goal Formulation: With patient Time For Goal Achievement: 09/09/20 Potential to Achieve Goals: Good    Frequency Min 2X/week   Barriers to discharge        Co-evaluation               AM-PAC PT "6 Clicks" Mobility  Outcome Measure Help needed turning from your back to your side while in a flat bed without using bedrails?: A Little Help needed moving from lying on your back to sitting on the side of a flat bed without using bedrails?: A Little Help needed moving to and from a bed to a chair (including a wheelchair)?: A Lot Help needed standing  up from a chair using your arms (e.g., wheelchair or bedside chair)?: A Lot Help needed to walk in hospital room?: A Lot Help needed climbing 3-5 steps with a railing? : Total 6 Click Score: 13    End of Session Equipment Utilized During Treatment: Oxygen Activity Tolerance: Patient limited by fatigue Patient left: in chair;with call bell/phone within reach;with family/visitor present   PT Visit Diagnosis: Other abnormalities of gait and mobility (R26.89)    Time: 9791-5041 PT Time Calculation (min) (ACUTE ONLY): 16 min   Charges:   PT Evaluation $PT Eval Low Complexity: 1 Low     Kati PT, DPT Acute Rehabilitation Services Pager: [phone] Office: [phone]  York Ram E 08/26/2020, 12:13 PM

## 2020-08-26 NOTE — TOC Initial Note (Signed)
Transition of Care [facility]) - Initial/Assessment Note    Patient Details  Name: Pamela Mejia MRN: [id number] Date of Birth: July 17, 1926  [facility]) CM/SW Contact:    Lennart Pall, LCSW Phone Number: 08/26/2020, 1:33 PM  Clinical Narrative:                 Met with pt today to introduce self/ TOC role.  We discussed PT recommendations for SNF rehab and pt is in full agreement.  Reports that she lives at [facility] but does not feel she is strong enough to return at her current functional level.  She is agreed with start of SNF bed search.  Have left a VM for son, [name], with plan information.  Will begin SNF process.  Expected Discharge Plan: Skilled Nursing Facility Barriers to Discharge: Continued Medical Work up   Patient Goals and CMS Choice Patient states their goals for this hospitalization and ongoing recovery are:: to return to [place] once rehab completed   Choice offered to / list presented to : [place]  Expected Discharge Plan and Services Expected Discharge Plan: [place] In-house Referral: Clinical Social Work   Post Acute Care Choice: [place] Living arrangements for the past 2 months: [place]                 DME Arranged: N/A DME Agency: NA                  Prior Living Arrangements/Services Living arrangements for the past 2 months: [place] Lives with:: Self Patient language and need for interpreter reviewed:: Yes Do you feel safe going back to the place where you live?: Yes      Need for Family Participation in Patient Care: No (Comment) Care giver support system in place?: No (comment)   Criminal Activity/Legal Involvement Pertinent to Current Situation/Hospitalization: No - Comment as needed  Activities of Daily Living Home Assistive Devices/Equipment: Environmental consultant (specify type) ADL Screening (condition at time of  admission) Patient's cognitive ability adequate to safely complete daily activities?: Yes Is the patient deaf or have difficulty hearing?: No Does the patient have difficulty seeing, even when wearing glasses/contacts?: Yes (pt is blind) Does the patient have difficulty concentrating, remembering, or making decisions?: No (some trouble concentrating) Patient able to express need for assistance with ADLs?: Yes Does the patient have difficulty dressing or bathing?: Yes Independently performs ADLs?: No Communication: Independent Dressing (OT): Needs assistance Is this a change from baseline?: Pre-admission baseline Grooming: Independent Feeding: Independent Bathing: Needs assistance Is this a change from baseline?: Pre-admission baseline Toileting: Needs assistance Is this a change from baseline?: Change from baseline, expected to last >3days In/Out Bed: Needs assistance Is this a change from baseline?: Change from baseline, expected to last >3 days Walks in Home: Needs assistance Is this a change from baseline?: Change from baseline, expected to last >3 days Does the patient have difficulty walking or climbing stairs?: Yes Weakness of Legs: Both Weakness of Arms/Hands: Both  Permission Sought/Granted Permission sought to share information with : Facility Contact Representative,Family Supports Permission granted to share information with : Yes, Verbal Permission Granted  Share Information with NAME: Almira [place]     Permission granted to share info w Relationship: son  Permission granted to share info w Contact Information: [phone]  Emotional Assessment Appearance:: Appears stated age Attitude/Demeanor/Rapport: Gracious,Engaged Affect (typically observed): Accepting Orientation: : Oriented to Self,Oriented to Place,Oriented to  Time,Oriented  to Situation Alcohol / Substance Use: Not Applicable Psych Involvement: No (comment)  Admission diagnosis:  Pneumonia [J18.9] Renal  dysfunction [N28.9] SIRS (systemic inflammatory response syndrome) (HCC) [R65.10] HCAP (healthcare-associated pneumonia) [J18.9] Patient Active Problem List   Diagnosis Date Noted  . Pneumonia 08/19/2020  . CAP (community acquired pneumonia) 07/29/2020  . AKI (acute kidney injury) ([place]) 07/29/2020  . History of diverticulitis 09/23/2015  . Asthma, moderate persistent 02/03/2015  . Hypothyroidism 02/03/2015  . Essential hypertension 02/03/2015  . Peritonitis ([place]) 02/03/2015   PCP:  Lorene Dy, MD Pharmacy:   [facility] [place], [place] - [address] AT [place] [address] [place] [address] Phone: [phone] Fax: [phone]  [place], [place] - [address] AT [facility] [address] [place] [address] Phone: [phone] Fax: [phone]     Social Determinants of Health ([place]) Interventions    Readmission Risk Interventions Readmission Risk Prevention Plan 08/26/2020  Transportation Screening Complete  PCP or Specialist Appt within 5-7 Days Complete  Home Care Screening Complete  Some recent data might be hidden

## 2020-08-26 NOTE — Progress Notes (Signed)
PROGRESS NOTE    Pamela Mejia  PPJ:[id number]  DOB: 27-Jul-1926  DOA: 08/14/2020 PCP: Pamela Dy, MD Outpatient Specialists:   Hospital course:  87 year old female with history of hypertension, hypothyroidism, had hiatal hernia presented to the ED with cough and shortness of breath and was admitted 08/04/2020 with RLL pneumonia likely aspiration.   Subjective:  "Everything tires me out".  [name] states that she was doing well until physical therapy came by and worked with her and since then has been quite tired.  He notes that his mother had looked to be almost at baseline prior to PT.  Patient states that she was short of breath eating after having done PT but hopes she will recuperate after resting.   Objective: Vitals:   08/26/20 0600 08/26/20 0923 08/26/20 1350 08/26/20 1410  BP:   (!) 156/73   Pulse:   67   Resp:   16   Temp:   97.9 F (36.6 C)   TempSrc:   Oral   SpO2: 94% 95% 94% 95%  Weight:      Height:        Intake/Output Summary (Last 24 hours) at 08/26/2020 1456 Last data filed at 08/26/2020 1400 Gross per 24 hour  Intake 1730 ml  Output 1010 ml  Net 720 ml   Filed Weights   07/26/2020 1157  Weight: 53.8 kg     Exam:  General: Weak and tired appearing female looking stated age sitting up in recliner with tachypnea but no labored breathing.  She is able to speak in short sentences before taking a breath. Eyes: sclera anicteric, conjuctiva mild injection bilaterally CVS: S1-S2, regular  Respiratory:  decreased air entry bilaterally with few rales at right base. GI: NABS, soft, NT  LE: No edema.  Neuro:  grossly nonfocal.  Psych: mood and affect appropriate to situation.   Assessment & Plan:   87 year old female is admitted with likely aspiration RLL pneumonia  RLL pneumonia Patient has been recently treated with antibiotics as an outpatient Continue cefepime day #2 She has multiple antibiotic allergies Oxygen as needed Continue to  work with PT as tolerated  Anemia Patient had significant drop in H&H yesterday however she remains hemodynamically stable. It is unclear what patient's baseline is, it is possible that she was profoundly dehydrated upon admission and hemoconcentrated. Will send stool for occult blood H&H today is stable on repeat.  HTN Continue amlodipine  AKI Continues to improve Continue to hold losartan  Hypothyroidism Continue Synthroid   DVT prophylaxis: Lovenox Code Status: DNR Family Communication: [name] was at bedside throughout Disposition Plan:   Patient is from: Home  Anticipated Discharge Location: Likely home  Barriers to Discharge: Still very weak and short of breath  Is patient medically stable for Discharge: No   Consultants:  None  Procedures:  None  Antimicrobials:  Cefepime day #2   Data Reviewed:  Basic Metabolic Panel: Recent Labs  Lab 08/11/2020 1233 08/25/20 0426 08/26/20 0918  NA 143 142 143  K 4.2 3.6 3.6  CL 108 113* 113*  CO2 22 21* 20*  GLUCOSE 99 92 75  BUN 36* 32* 25*  CREATININE 1.72* 1.26* 1.04*  CALCIUM 9.7 8.6* 9.1   Liver Function Tests: Recent Labs  Lab 07/28/2020 1233 08/25/20 0426  AST 61* 62*  ALT 27 23  ALKPHOS 141* 110  BILITOT 0.9 0.8  PROT 6.9 5.4*  ALBUMIN 2.9* 2.3*   No results for input(s): LIPASE, AMYLASE in the last  168 hours. No results for input(s): AMMONIA in the last 168 hours. CBC: Recent Labs  Lab 08/11/2020 1233 08/25/20 0426 08/26/20 0918  WBC 9.8 8.8 11.2*  NEUTROABS 6.7  --  7.5  HGB 11.8* 9.3* 9.7*  HCT 37.6 29.6* 30.2*  MCV 94.5 94.0 92.6  PLT 274 243 279   Cardiac Enzymes: No results for input(s): CKTOTAL, CKMB, CKMBINDEX, TROPONINI in the last 168 hours. BNP (last 3 results) No results for input(s): PROBNP in the last 8760 hours. CBG: No results for input(s): GLUCAP in the last 168 hours.  Recent Results (from the past 240 hour(s))  MRSA PCR Screening     Status: None    Collection Time: 08/21/2020  5:28 PM   Specimen: Nasopharyngeal  Result Value Ref Range Status   MRSA by PCR NEGATIVE NEGATIVE Final    Comment:        The GeneXpert MRSA Assay (FDA approved for NASAL specimens only), is one component of a comprehensive MRSA colonization surveillance program. It is not intended to diagnose MRSA infection nor to guide or monitor treatment for MRSA infections. Performed at [facility], [place] [address], [place], [place] [address]       Studies: No results found.   Scheduled Meds: . amLODipine  5 mg Oral Daily  . enoxaparin (LOVENOX) injection  30 mg Subcutaneous Q24H  . feeding supplement ([place] [place] STANDARD 1.4)  325 mL Oral BID BM  . guaiFENesin  600 mg Oral BID  . levothyroxine  50 mcg Oral Q0600  . mometasone-formoterol  2 puff Inhalation BID  . pantoprazole  40 mg Oral Q1200  . simvastatin  20 mg Oral Daily   Continuous Infusions: . ceFEPime (MAXIPIME) IV 2 g (08/26/20 1326)    Active Problems:   Pneumonia     Pamela Mejia Pamela Mejia, [facility]  If 7PM-7AM, please contact night-coverage [email or web address]   LOS: 2 days

## 2020-08-26 NOTE — NC FL2 (Signed)
[place] LEVEL OF CARE SCREENING TOOL     IDENTIFICATION  Patient Name: Pamela Mejia Birthdate: 1926/11/03 Sex: female Admission Date (Current Location): 07/25/2020  [facility] and [place] Number:  Herbalist and Address:  [facility],  [place], [place]      Provider Number: [id number]  Attending Physician Name and Address:  Oren Binet*  Relative Name and Phone Number:  son, Haileyann Staiger @ [phone]    Current Level of Care: Hospital Recommended Level of Care: [place] Prior Approval Number:    Date Approved/Denied:   PASRR Number: [id number]  Discharge Plan: SNF    Current Diagnoses: Patient Active Problem List   Diagnosis Date Noted  . Pneumonia 08/09/2020  . CAP (community acquired pneumonia) 07/29/2020  . AKI (acute kidney injury) ([place]) 07/29/2020  . History of diverticulitis 09/23/2015  . Asthma, moderate persistent 02/03/2015  . Hypothyroidism 02/03/2015  . Essential hypertension 02/03/2015  . Peritonitis ([place]) 02/03/2015    Orientation RESPIRATION BLADDER Height & Weight     Self,Time,Situation,Place  O2 Incontinent,External catheter (purewick) Weight: 118 lb 9.7 oz (53.8 kg) Height:  5' (152.4 cm)  BEHAVIORAL SYMPTOMS/MOOD NEUROLOGICAL BOWEL NUTRITION STATUS      Continent    AMBULATORY STATUS COMMUNICATION OF NEEDS Skin   Extensive Assist Verbally Normal                       Personal Care Assistance Level of Assistance  Bathing,Dressing Bathing Assistance: Limited assistance   Dressing Assistance: Limited assistance     Functional Limitations Info             SPECIAL CARE FACTORS FREQUENCY  PT (By licensed PT),OT (By licensed OT)     PT Frequency: 5x/wk OT Frequency: 5x/wk            Contractures Contractures Info: Not present    Additional Factors Info  Code Status,Allergies Code Status Info: DNR Allergies Info: see MAR            Current Medications (08/26/2020):  This is the current hospital active medication list Current Facility-Administered Medications  Medication Dose Route Frequency Provider Last Rate Last Admin  . albuterol (PROVENTIL) (2.5 MG/3ML) 0.083% nebulizer solution 2.5 mg  2.5 mg Nebulization Q2H PRN Rise Patience, MD   2.5 mg at 08/25/20 1058  . amLODipine (NORVASC) tablet 5 mg  5 mg Oral Daily Kyle, Tyrone A, DO   5 mg at 08/26/20 0917  . ceFEPIme (MAXIPIME) 2 g in sodium chloride 0.9 % 100 mL IVPB  2 g Intravenous Q24H Domenic Polite, MD 200 mL/hr at 08/26/20 1326 2 g at 08/26/20 1326  . enoxaparin (LOVENOX) injection 30 mg  30 mg Subcutaneous Q24H Domenic Polite, MD   30 mg at 08/26/20 1141  . feeding supplement (KATE FARMS STANDARD 1.4) liquid 325 mL  325 mL Oral BID BM Domenic Polite, MD   325 mL at 08/26/20 1325  . guaiFENesin (MUCINEX) 12 hr tablet 600 mg  600 mg Oral BID Marylyn Ishihara, Tyrone A, DO   600 mg at 08/26/20 0917  . levothyroxine (SYNTHROID) tablet 50 mcg  50 mcg Oral Q0600 Cherylann Ratel A, DO   50 mcg at 08/26/20 0522  . mometasone-formoterol (DULERA) 200-5 MCG/ACT inhaler 2 puff  2 puff Inhalation BID Marylyn Ishihara, Tyrone A, DO   2 puff at 08/26/20 0923  . ondansetron (ZOFRAN) tablet 4 mg  4 mg Oral Q6H PRN Marylyn Ishihara,  Tyrone A, DO       Or  . ondansetron (ZOFRAN) injection 4 mg  4 mg Intravenous Q6H PRN Marylyn Ishihara, Tyrone A, DO      . pantoprazole (PROTONIX) EC tablet 40 mg  40 mg Oral Q1200 Domenic Polite, MD   40 mg at 08/26/20 1141  . simvastatin (ZOCOR) tablet 20 mg  20 mg Oral Daily Kyle, Tyrone A, DO   20 mg at 08/26/20 3276     Discharge Medications: Please see discharge summary for a list of discharge medications.  Relevant Imaging Results:  Relevant Lab Results:   Additional Information SS#: [id number]; has had vaccine x 2 and one booster; currently on IV abx but may change to po at [facility], LCSW

## 2020-08-27 ENCOUNTER — Inpatient Hospital Stay (HOSPITAL_COMMUNITY): Payer: Medicare Other

## 2020-08-27 DIAGNOSIS — E440 Moderate protein-calorie malnutrition: Secondary | ICD-10-CM | POA: Insufficient documentation

## 2020-08-27 LAB — BASIC METABOLIC PANEL
Anion gap: 10 (ref 5–15)
BUN: 27 mg/dL — ABNORMAL HIGH (ref 8–23)
CO2: 20 mmol/L — ABNORMAL LOW (ref 22–32)
Calcium: 9 mg/dL (ref 8.9–10.3)
Chloride: 113 mmol/L — ABNORMAL HIGH (ref 98–111)
Creatinine, Ser: 1.07 mg/dL — ABNORMAL HIGH (ref 0.44–1.00)
GFR, Estimated: 49 mL/min — ABNORMAL LOW (ref >60)
Glucose, Bld: 84 mg/dL (ref 70–99)
Potassium: 3.4 mmol/L — ABNORMAL LOW (ref 3.5–5.1)
Sodium: 143 mmol/L (ref 135–145)

## 2020-08-27 LAB — CBC
HCT: 29.3 % — ABNORMAL LOW (ref 36.0–46.0)
Hemoglobin: 9.2 g/dL — ABNORMAL LOW (ref 12.0–15.0)
MCH: 29.3 pg (ref 26.0–34.0)
MCHC: 31.4 g/dL (ref 30.0–36.0)
MCV: 93.3 fL (ref 80.0–100.0)
Platelets: 313 10*3/uL (ref 150–400)
RBC: 3.14 MIL/uL — ABNORMAL LOW (ref 3.87–5.11)
RDW: 13.5 % (ref 11.5–15.5)
WBC: 11.5 10*3/uL — ABNORMAL HIGH (ref 4.0–10.5)
nRBC: 0 % (ref 0.0–0.2)

## 2020-08-27 LAB — RESP PANEL BY RT-PCR (FLU A&B, COVID) ARPGX2
Influenza A by PCR: NEGATIVE
Influenza B by PCR: NEGATIVE
SARS Coronavirus 2 by RT PCR: NEGATIVE

## 2020-08-27 MED ORDER — IPRATROPIUM-ALBUTEROL 0.5-2.5 (3) MG/3ML IN SOLN
3.0000 mL | Freq: Four times a day (QID) | RESPIRATORY_TRACT | Status: DC
  Administered 2020-08-27: 3 mL via RESPIRATORY_TRACT
  Filled 2020-08-27: qty 3

## 2020-08-27 MED ORDER — METHYLPREDNISOLONE SODIUM SUCC 40 MG IJ SOLR
40.0000 mg | Freq: Two times a day (BID) | INTRAMUSCULAR | Status: DC
  Administered 2020-08-27 – 2020-08-28 (×4): 40 mg via INTRAVENOUS
  Filled 2020-08-27 (×4): qty 1

## 2020-08-27 MED ORDER — IPRATROPIUM-ALBUTEROL 0.5-2.5 (3) MG/3ML IN SOLN: 3.0000 mL | Freq: Four times a day (QID) | RESPIRATORY_TRACT | Status: DC

## 2020-08-27 MED ORDER — METRONIDAZOLE 500 MG/100ML IV SOLN
500.0000 mg | Freq: Three times a day (TID) | INTRAVENOUS | Status: AC
  Administered 2020-08-27 – 2020-09-03 (×21): 500 mg via INTRAVENOUS
  Filled 2020-08-27 (×21): qty 100

## 2020-08-27 MED ORDER — IPRATROPIUM-ALBUTEROL 0.5-2.5 (3) MG/3ML IN SOLN
3.0000 mL | Freq: Three times a day (TID) | RESPIRATORY_TRACT | Status: DC
  Administered 2020-08-27 – 2020-08-28 (×2): 3 mL via RESPIRATORY_TRACT
  Filled 2020-08-27 (×2): qty 3

## 2020-08-27 NOTE — Progress Notes (Signed)
[facility] Progress Note  Patient: Pamela Mejia    PNT:[id number]  DOA: 08/03/2020     Date of Service: the patient was seen and examined on 08/27/2020  Brief hospital course: PMH  HTN, hypothyroidism, HLD. Presented with shortness of breath and found to have asthma exacerbation Currently plan is continue current care.  Assessment and Plan: 1.  Right lower lobe pneumonia. Asthma exacerbation. Patient has been dealing with this since May 4.  Has completed 2 courses of antibiotics and this is her third time. After initial improvement for the last 2 days now appears to have sudden worsening requiring nonrebreather. Bilateral expiratory wheezing on examination. Chest x-ray reportedly shows multifocal pneumonia which is worsening although appears to be about the same clinically. COVID test is negative. Influenza negative as well. At present we will initiate IV steroids, antibiotics and monitor response.  2.  Anemia Patient had a drop in her hemoglobin without any acute bleeding. Hemoglobin remained stable but Unsure of this etiology.  We will continue to monitor.  3.  Essential hypertension Blood pressure stable. Continue Norvasc.  4.  AKI Improving. Holding losartan.  5.  Hypothyroidism Continue Synthroid.  6.  Moderate protein calorie malnutrition Placing the patient at high risk for poor outcome. Continue supplements. Body mass index is 23.16 kg/m.  Nutrition Problem: Moderate Malnutrition Etiology: acute illness (CAP) Interventions: Interventions: Refer to RD note for recommendations      Diet: Regular diet DVT Prophylaxis:   enoxaparin (LOVENOX) injection 30 mg Start: 08/25/20 1200 SCDs Start: 08/13/2020 1756    Advance goals of care discussion: DNR  Family Communication: family was present at bedside, at the time of interview.  The pt provided permission to discuss medical plan with the family. Opportunity was given to ask question and all questions were  answered satisfactorily.   Disposition:  Status is: Inpatient  Remains inpatient appropriate because:IV treatments appropriate due to intensity of illness or inability to take PO and Inpatient level of care appropriate due to severity of illness   Dispo: The patient is from: Home              Anticipated d/c is to: SNF              Patient currently is not medically stable to d/c.   Difficult to place patient No        Subjective: No nausea or vomiting.  Reportedly had overnight worsening of her breathing requiring nonrebreather.  No chest pain or chest tightness.  Improved after nebulizer therapy.  Physical Exam:  General: Appear in mild distress, no Rash; Oral Mucosa Clear, moist. no Abnormal Neck Mass Or lumps, Conjunctiva normal  Cardiovascular: S1 and S2 Present, no Murmur, Respiratory: good respiratory effort, Bilateral Air entry present and CTA, no Crackles, bilateral expiratory wheezes Abdomen: Bowel Sound present, Soft and no tenderness Extremities: no Pedal edema Neurology: alert and oriented to time, place, and person affect appropriate. no new focal deficit Gait not checked due to patient safety concerns    Vitals:   08/27/20 1154 08/27/20 1214 08/27/20 1234 08/27/20 1626  BP:  (!) 141/84 (!) 141/70 131/76  Pulse:  85 66 88  Resp:  (!) 22 (!) 22 (!) 23  Temp:   98.1 F (36.7 C) 97.7 F (36.5 C)  TempSrc:   Oral Oral  SpO2: 100% 100% 100% 95%  Weight:      Height:        Intake/Output Summary (Last 24 hours) at 08/27/2020 1931 Last  data filed at 08/27/2020 1819 Gross per 24 hour  Intake 440 ml  Output 175 ml  Net 265 ml   Filed Weights   08/10/2020 1157  Weight: 53.8 kg   Data Reviewed: I have personally reviewed and interpreted daily labs, tele strips, imaging. I reviewed all nursing notes, pharmacy notes, vitals, pertinent old records I have discussed plan of care as described above with RN and patient/family.  CBC: Recent Labs  Lab 08/13/2020 1233  08/25/20 0426 08/26/20 0918 08/27/20 0349  WBC 9.8 8.8 11.2* 11.5*  NEUTROABS 6.7  --  7.5  --   HGB 11.8* 9.3* 9.7* 9.2*  HCT 37.6 29.6* 30.2* 29.3*  MCV 94.5 94.0 92.6 93.3  PLT 274 243 279 102   Basic Metabolic Panel: Recent Labs  Lab 07/25/2020 1233 08/25/20 0426 08/26/20 0918 08/27/20 0349  NA 143 142 143 143  K 4.2 3.6 3.6 3.4*  CL 108 113* 113* 113*  CO2 22 21* 20* 20*  GLUCOSE 99 92 75 84  BUN 36* 32* 25* 27*  CREATININE 1.72* 1.26* 1.04* 1.07*  CALCIUM 9.7 8.6* 9.1 9.0   Studies: DG CHEST PORT 1 VIEW  Result Date: 08/27/2020 CLINICAL DATA:  Increasing shortness of breath EXAM: PORTABLE CHEST 1 VIEW COMPARISON:  07/25/2020 FINDINGS: Single frontal view of the chest demonstrates increasing right basilar airspace disease, with new consolidation in the right upper and left lower lobes. Small left pleural effusion new since prior study. No pneumothorax. No acute bony abnormalities. Stable hiatal hernia. IMPRESSION: 1. Increasing bilateral airspace disease consistent with multifocal pneumonia. 2. Small left pleural effusion, new since prior study. Electronically Signed   By: Randa Ngo M.D.   On: 08/27/2020 02:42    Scheduled Meds: . amLODipine  5 mg Oral Daily  . enoxaparin (LOVENOX) injection  30 mg Subcutaneous Q24H  . feeding supplement (KATE FARMS STANDARD 1.4)  325 mL Oral BID BM  . guaiFENesin  600 mg Oral BID  . ipratropium-albuterol  3 mL Nebulization TID  . levothyroxine  50 mcg Oral Q0600  . methylPREDNISolone (SOLU-MEDROL) injection  40 mg Intravenous BID  . mometasone-formoterol  2 puff Inhalation BID  . pantoprazole  40 mg Oral Q1200   Continuous Infusions: . ceFEPime (MAXIPIME) IV 2 g (08/27/20 1400)  . metronidazole 500 mg (08/27/20 1826)   PRN Meds: albuterol, ondansetron **OR** ondansetron (ZOFRAN) IV  Time spent: 35 minutes  Author: Berle Mull, MD [facility] 08/27/2020 7:31 PM  To reach On-call, see care teams to locate the  attending and reach out via [email or web address]. Between 7PM-7AM, please contact night-coverage If you still have difficulty reaching the attending provider, please page the [facility] (Director on Call) for [facility] on amion for assistance.

## 2020-08-27 NOTE — Progress Notes (Signed)
Patient transferred to room 1432, report given to receiving RN Wille Glaser

## 2020-08-27 NOTE — Progress Notes (Signed)
Patient called nurse complaining that she can't breath.  Stated it came on suddenly.  Oxygen on 2L was 86% and Pulset rate was 120. Nurse increased her oxygen to 5L and called respiratory.  Nurse was advised to apply a non rebreather mask which was done .  Sats returned to 96.  Pulse 69.  Respiratory came and assessed.  Recommended calling MD Notifying Md for xray.  Lungs sound more diminished on the left side more than the right at this time compared to before.

## 2020-08-27 NOTE — Progress Notes (Signed)
rm 1302, Pamela Mejia,89YOF, in wit PNE, Called c/o She can't breath. Sat 86 Pulse 120. Resp applied NON rebreather. Resp rec xray.

## 2020-08-27 NOTE — Progress Notes (Signed)
Patient has been on partial non rebreaher mask and is 02 is 99% and pulse is 70's.  When nurse tried to return her back to 5L [place] she said she still had problems breathing.  Respirations stay between 18-30.  Respiratory came back and gave her breathing treatment. Xray was taken and resulted ".  Pt asked me to call her son [name] as she felt he needed know the situation.  I also let  [name] speak with patient directly on the phone. [name] said he will visit this morning. MD was informed of the Xray result. Increasing bilateral airspace disease consistent with multifocal Pneumonia. 2. Small left pleural effusion, new since prior study."

## 2020-08-27 NOTE — Care Management Important Message (Signed)
Important Message  Patient Details IM Letter given to the Patient. Name: Pamela Mejia MRN: [id number] Date of Birth: 1926-07-09   Medicare Important Message Given:  Yes     Kerin Salen 08/27/2020, 11:40 AM

## 2020-08-28 MED ORDER — QUETIAPINE FUMARATE 25 MG PO TABS
12.5000 mg | ORAL_TABLET | Freq: Every day | ORAL | Status: DC
  Administered 2020-08-28: 12.5 mg via ORAL
  Filled 2020-08-28: qty 1

## 2020-08-28 NOTE — Progress Notes (Signed)
[facility] Progress Note  Patient: Pamela Mejia    QIW:[id number]  DOA: 08/16/2020     Date of Service: the patient was seen and examined on 08/28/2020  Brief hospital course: PMH  HTN, hypothyroidism, HLD. Presented with shortness of breath and found to have asthma exacerbation Currently plan is continue current care.  Assessment and Plan: 1.  Right lower lobe pneumonia. Asthma exacerbation. Patient has been dealing with this since May 4.  Has completed 2 courses of antibiotics and this is her third time. After initial improvement for the last 2 days now appears to have sudden worsening requiring nonrebreather. Bilateral expiratory wheezing on examination. Chest x-ray reportedly shows multifocal pneumonia which is worsening although appears to be about the same clinically. COVID test is negative. Influenza negative as well. Continue IV steroids.  Wheezing appears to have improved.  Appears to have some confusion will monitor.  2.  Anemia Patient had a drop in her hemoglobin without any acute bleeding. Hemoglobin remained stable but Unsure of this etiology.  We will continue to monitor.  3.  Essential hypertension Blood pressure stable. Continue Norvasc.  4.  AKI Improving. Holding losartan.  5.  Hypothyroidism Continue Synthroid.  6.  Moderate protein calorie malnutrition Placing the patient at high risk for poor outcome. Continue supplements. Body mass index is 23.16 kg/m.  Nutrition Problem: Moderate Malnutrition Etiology: acute illness (CAP) Interventions: Interventions: Refer to RD note for recommendations   7.  Acute metabolic encephalopathy. Suspect delirium in the setting of hospitalization and hypoxia. Will attempt to use Seroquel. Suspect this is also secondary to steroid.  For now we will continue current dose and implement rapid taper once clinically improving.      Diet: Regular diet DVT Prophylaxis:   enoxaparin (LOVENOX) injection 30 mg  Start: 08/25/20 1200 SCDs Start: 08/21/2020 1756    Advance goals of care discussion: DNR  Family Communication: no family was present at bedside, at the time of interview.   Disposition:  Status is: Inpatient  Remains inpatient appropriate because:IV treatments appropriate due to intensity of illness or inability to take PO and Inpatient level of care appropriate due to severity of illness   Dispo: The patient is from: Home              Anticipated d/c is to: SNF              Patient currently is not medically stable to d/c.   Difficult to place patient No        Subjective: Confused overnight and agitation. No nausea no vomiting but no fever no chills.  Physical Exam:  General: Appear in mild distress, no Rash; Oral Mucosa Clear, moist. no Abnormal Neck Mass Or lumps, Conjunctiva normal  Cardiovascular: S1 and S2 Present, no Murmur, Respiratory: good respiratory effort, Bilateral Air entry present and CTA, no Crackles, no wheezes Abdomen: Bowel Sound present, Soft and no tenderness Extremities: no Pedal edema Neurology: alert and oriented to time, place, and person affect appropriate. no new focal deficit Gait not checked due to patient safety concerns   Vitals:   08/28/20 0116 08/28/20 0503 08/28/20 0826 08/28/20 1505  BP: (!) 163/75 (!) 152/69  (!) 151/69  Pulse: 83 67  65  Resp: (!) 22 18  (!) 24  Temp: 98.1 F (36.7 C) 97.7 F (36.5 C)  97.9 F (36.6 C)  TempSrc: Oral Oral  Oral  SpO2: 98% 97% 100% 93%  Weight:      Height:  Intake/Output Summary (Last 24 hours) at 08/28/2020 1713 Last data filed at 08/27/2020 1819 Gross per 24 hour  Intake 0 ml  Output 0 ml  Net 0 ml   Filed Weights   08/04/2020 1157  Weight: 53.8 kg   Data Reviewed: I have personally reviewed and interpreted daily labs, tele strips, imaging. I reviewed all nursing notes, pharmacy notes, vitals, pertinent old records I have discussed plan of care as described above with RN and  patient/family.  CBC: Recent Labs  Lab 07/27/2020 1233 08/25/20 0426 08/26/20 0918 08/27/20 0349  WBC 9.8 8.8 11.2* 11.5*  NEUTROABS 6.7  --  7.5  --   HGB 11.8* 9.3* 9.7* 9.2*  HCT 37.6 29.6* 30.2* 29.3*  MCV 94.5 94.0 92.6 93.3  PLT 274 243 279 378   Basic Metabolic Panel: Recent Labs  Lab 08/14/2020 1233 08/25/20 0426 08/26/20 0918 08/27/20 0349  NA 143 142 143 143  K 4.2 3.6 3.6 3.4*  CL 108 113* 113* 113*  CO2 22 21* 20* 20*  GLUCOSE 99 92 75 84  BUN 36* 32* 25* 27*  CREATININE 1.72* 1.26* 1.04* 1.07*  CALCIUM 9.7 8.6* 9.1 9.0   Studies: No results found.  Scheduled Meds: . amLODipine  5 mg Oral Daily  . enoxaparin (LOVENOX) injection  30 mg Subcutaneous Q24H  . feeding supplement (KATE FARMS STANDARD 1.4)  325 mL Oral BID BM  . guaiFENesin  600 mg Oral BID  . levothyroxine  50 mcg Oral Q0600  . methylPREDNISolone (SOLU-MEDROL) injection  40 mg Intravenous BID  . mometasone-formoterol  2 puff Inhalation BID  . pantoprazole  40 mg Oral Q1200  . QUEtiapine  12.5 mg Oral QHS   Continuous Infusions: . ceFEPime (MAXIPIME) IV 2 g (08/28/20 1335)  . metronidazole 500 mg (08/28/20 1012)   PRN Meds: albuterol, ondansetron **OR** ondansetron (ZOFRAN) IV  Time spent: 35 minutes  Author: Berle Mull, MD [facility] 08/28/2020 5:13 PM  To reach On-call, see care teams to locate the attending and reach out via [email or web address]. Between 7PM-7AM, please contact night-coverage If you still have difficulty reaching the attending provider, please Mejia the [facility] (Director on Call) for [facility] on amion for assistance.

## 2020-08-28 NOTE — Plan of Care (Signed)

## 2020-08-29 ENCOUNTER — Inpatient Hospital Stay (HOSPITAL_COMMUNITY): Payer: Medicare Other

## 2020-08-29 LAB — CBC WITH DIFFERENTIAL/PLATELET
Abs Immature Granulocytes: 1.2 10*3/uL — ABNORMAL HIGH (ref 0.00–0.07)
Band Neutrophils: 3 %
Basophils Absolute: 0 10*3/uL (ref 0.0–0.1)
Basophils Relative: 0 %
Eosinophils Absolute: 0 10*3/uL (ref 0.0–0.5)
Eosinophils Relative: 0 %
HCT: 30.9 % — ABNORMAL LOW (ref 36.0–46.0)
Hemoglobin: 9.8 g/dL — ABNORMAL LOW (ref 12.0–15.0)
Lymphocytes Relative: 5 %
Lymphs Abs: 1 10*3/uL (ref 0.7–4.0)
MCH: 29.7 pg (ref 26.0–34.0)
MCHC: 31.7 g/dL (ref 30.0–36.0)
MCV: 93.6 fL (ref 80.0–100.0)
Metamyelocytes Relative: 2 %
Monocytes Absolute: 0.2 10*3/uL (ref 0.1–1.0)
Monocytes Relative: 1 %
Myelocytes: 4 %
Neutro Abs: 17.3 10*3/uL — ABNORMAL HIGH (ref 1.7–7.7)
Neutrophils Relative %: 85 %
Platelets: 324 10*3/uL (ref 150–400)
RBC: 3.3 MIL/uL — ABNORMAL LOW (ref 3.87–5.11)
RDW: 14.2 % (ref 11.5–15.5)
WBC: 19.7 10*3/uL — ABNORMAL HIGH (ref 4.0–10.5)
nRBC: 0.4 % — ABNORMAL HIGH (ref 0.0–0.2)

## 2020-08-29 LAB — COMPREHENSIVE METABOLIC PANEL
ALT: 51 U/L — ABNORMAL HIGH (ref 0–44)
AST: 115 U/L — ABNORMAL HIGH (ref 15–41)
Albumin: 2.4 g/dL — ABNORMAL LOW (ref 3.5–5.0)
Alkaline Phosphatase: 237 U/L — ABNORMAL HIGH (ref 38–126)
Anion gap: 10 (ref 5–15)
BUN: 38 mg/dL — ABNORMAL HIGH (ref 8–23)
CO2: 22 mmol/L (ref 22–32)
Calcium: 9.8 mg/dL (ref 8.9–10.3)
Chloride: 119 mmol/L — ABNORMAL HIGH (ref 98–111)
Creatinine, Ser: 0.85 mg/dL (ref 0.44–1.00)
GFR, Estimated: >60 mL/min (ref >60)
Glucose, Bld: 104 mg/dL — ABNORMAL HIGH (ref 70–99)
Potassium: 3.2 mmol/L — ABNORMAL LOW (ref 3.5–5.1)
Sodium: 151 mmol/L — ABNORMAL HIGH (ref 135–145)
Total Bilirubin: 0.8 mg/dL (ref 0.3–1.2)
Total Protein: 5.5 g/dL — ABNORMAL LOW (ref 6.5–8.1)

## 2020-08-29 LAB — BASIC METABOLIC PANEL
Anion gap: 9 (ref 5–15)
BUN: 37 mg/dL — ABNORMAL HIGH (ref 8–23)
CO2: 20 mmol/L — ABNORMAL LOW (ref 22–32)
Calcium: 9.3 mg/dL (ref 8.9–10.3)
Chloride: 121 mmol/L — ABNORMAL HIGH (ref 98–111)
Creatinine, Ser: 1.01 mg/dL — ABNORMAL HIGH (ref 0.44–1.00)
GFR, Estimated: 53 mL/min — ABNORMAL LOW (ref >60)
Glucose, Bld: 120 mg/dL — ABNORMAL HIGH (ref 70–99)
Potassium: 3.1 mmol/L — ABNORMAL LOW (ref 3.5–5.1)
Sodium: 150 mmol/L — ABNORMAL HIGH (ref 135–145)

## 2020-08-29 MED ORDER — HYDRALAZINE HCL 20 MG/ML IJ SOLN
10.0000 mg | INTRAMUSCULAR | Status: DC | PRN
  Administered 2020-08-29 – 2020-09-01 (×3): 10 mg via INTRAVENOUS
  Filled 2020-08-29 (×3): qty 1

## 2020-08-29 MED ORDER — QUETIAPINE FUMARATE 25 MG PO TABS
25.0000 mg | ORAL_TABLET | Freq: Every day | ORAL | Status: DC
  Administered 2020-08-29: 25 mg via ORAL
  Filled 2020-08-29: qty 1

## 2020-08-29 MED ORDER — SODIUM CHLORIDE 0.9 % IV SOLN
2.0000 g | INTRAVENOUS | Status: DC
  Administered 2020-08-29 – 2020-09-02 (×5): 2 g via INTRAVENOUS
  Filled 2020-08-29 (×3): qty 2
  Filled 2020-08-29 (×2): qty 20
  Filled 2020-08-29: qty 2

## 2020-08-29 MED ORDER — ENOXAPARIN SODIUM 40 MG/0.4ML IJ SOSY
40.0000 mg | PREFILLED_SYRINGE | Freq: Every day | INTRAMUSCULAR | Status: DC
  Administered 2020-08-29: 40 mg via SUBCUTANEOUS
  Filled 2020-08-29: qty 0.4

## 2020-08-29 MED ORDER — ENOXAPARIN SODIUM 30 MG/0.3ML IJ SOSY
30.0000 mg | PREFILLED_SYRINGE | Freq: Every day | INTRAMUSCULAR | Status: DC
  Administered 2020-08-30 – 2020-09-02 (×3): 30 mg via SUBCUTANEOUS
  Filled 2020-08-29 (×3): qty 0.3

## 2020-08-29 MED ORDER — CEFDINIR 300 MG PO CAPS
300.0000 mg | ORAL_CAPSULE | Freq: Every day | ORAL | Status: DC
  Filled 2020-08-29: qty 1

## 2020-08-29 MED ORDER — DEXTROSE 5 % IV SOLN: INTRAVENOUS | Status: DC

## 2020-08-29 MED ORDER — PREDNISONE 20 MG PO TABS
30.0000 mg | ORAL_TABLET | Freq: Every day | ORAL | Status: DC
  Administered 2020-08-29 – 2020-09-02 (×4): 30 mg via ORAL
  Filled 2020-08-29 (×4): qty 1

## 2020-08-29 NOTE — Plan of Care (Signed)
  Problem: Education: Goal: Knowledge of General Education information will improve Description: Including pain rating scale, medication(s)/side effects and non-pharmacologic comfort measures Outcome: Progressing   Problem: Clinical Measurements: Goal: Ability to maintain clinical measurements within normal limits will improve Outcome: Progressing Goal: Respiratory complications will improve Outcome: Progressing Goal: Cardiovascular complication will be avoided Outcome: Progressing   Problem: Activity: Goal: Risk for activity intolerance will decrease Outcome: Progressing   Problem: Nutrition: Goal: Adequate nutrition will be maintained Outcome: Progressing   Problem: Elimination: Goal: Will not experience complications related to bowel motility Outcome: Progressing Goal: Will not experience complications related to urinary retention Outcome: Progressing   Problem: Pain Managment: Goal: General experience of comfort will improve Outcome: Progressing   Problem: Safety: Goal: Ability to remain free from injury will improve Outcome: Progressing   Problem: Skin Integrity: Goal: Risk for impaired skin integrity will decrease Outcome: Progressing

## 2020-08-29 NOTE — Progress Notes (Signed)
Pt had pulled out her IV and was trying to get out of bed.   Repeats that she is unable to breathe.  VS in chart, RR elevated.  Dr. Posey Pronto aware; CXR ordered.

## 2020-08-29 NOTE — Progress Notes (Signed)
[facility] Progress Note  Patient: Pamela Mejia    YHC:[id number]  DOA: 08/03/2020     Date of Service: the patient was seen and examined on 08/29/2020  Brief hospital course: PMH  HTN, hypothyroidism, HLD. Presented with shortness of breath and found to have asthma exacerbation.  Remains confused and agitated with poor p.o. intake. Currently plan is treat asthma situation, treat hypernatremia and treat for improvement in delirium.  Assessment and Plan: 1.  Right lower lobe pneumonia. Asthma exacerbation. Patient has been dealing with this since May 4. Has completed 2 courses of antibiotics and this is her third time. Chest x-ray shows multifocal pneumonia which is worsening although appears to be about the same clinically. COVID test is negative. Influenza negative as well. Treated with IV steroids. Now on p.o. steroids.  Dose reduced secondary to delirium. Continue nebulizer therapy.    2.  Acute metabolic encephalopathy. Mentation progressively worsening of her worsening hypoxia and initiation of the steroids. Patient has not been able to sleep properly for last 48 hours. Agitation primarily occurs at night. Suspect delirium in the setting of hospitalization steroid use and hypoxia. Will attempt to use Seroquel.  Increase the dose. Reconsult speech therapy as there is a concern for the patient's mentation change is limiting her ability to swallow safely.  3.  Essential hypertension Blood pressure stable. Continue Norvasc.  4.  AKI Baseline serum creatinine around 1.4. On presentation serum creatinine 1.7 Improving. Holding losartan.  5.  Hypothyroidism Continue Synthroid.  6.  Moderate protein calorie malnutrition Placing the patient at high risk for poor outcome. Continue supplements. Body mass index is 23.16 kg/m.  Nutrition Problem: Moderate Malnutrition Etiology: acute illness (CAP) Interventions: Interventions: Refer to RD note for recommendations    7.  Anemia Baseline hemoglobin around 11-12. Currently stable around 9.8. We will continue to monitor.  8.  Hypernatremia. Likely from poor p.o. intake. Will initiate IV D5 infusion. Monitor sodium every 6 hours starting 6 PM.  Diet: Dysphagia 3 thin liquid diet DVT Prophylaxis:   enoxaparin (LOVENOX) injection 40 mg Start: 08/29/20 1425 SCDs Start: 07/30/2020 1756   Advance goals of care discussion: DNR  Family Communication: no family was present at bedside, at the time of interview.   Disposition:  Status is: Inpatient  Remains inpatient appropriate because:IV treatments appropriate due to intensity of illness or inability to take PO and Inpatient level of care appropriate due to severity of illness  Dispo: The patient is from: Home              Anticipated d/c is to: SNF              Patient currently is not medically stable to d/c.   Difficult to place patient No   Subjective: Remains confused.  No nausea no vomiting no fever no chills.  Minimal oral intake.  Agitation overnight as well.  Physical Exam:  General: Appear in mild distress, no Rash; Oral Mucosa Clear, moist. no Abnormal Neck Mass Or lumps, Conjunctiva normal  Cardiovascular: S1 and S2 Present, no Murmur, Respiratory: good respiratory effort, Bilateral Air entry present and CTA, no Crackles, no wheezes Abdomen: Bowel Sound present, Soft and no tenderness Extremities: no Pedal edema Neurology: alert and oriented to time, place, and person affect appropriate. no new focal deficit Gait not checked due to patient safety concerns   Vitals:   08/29/20 1031 08/29/20 1105 08/29/20 1224 08/29/20 1503  BP: (!) 168/105 (!) 162/88 (!) 166/108 (!) 146/127  Pulse:  92  83 63  Resp: (!) 40   (!) 30  Temp: 97.8 F (36.6 C)  97.8 F (36.6 C) 98.2 F (36.8 C)  TempSrc: Axillary  Axillary Oral  SpO2: 92%  94% 92%  Weight:      Height:        Intake/Output Summary (Last 24 hours) at 08/29/2020 1528 Last data  filed at 08/29/2020 0740 Gross per 24 hour  Intake --  Output 600 ml  Net -600 ml   Filed Weights   07/29/2020 1157  Weight: 53.8 kg   Data Reviewed: I have personally reviewed and interpreted daily labs, tele strips, imaging. I reviewed all nursing notes, pharmacy notes, vitals, pertinent old records I have discussed plan of care as described above with RN and patient/family.  CBC: Recent Labs  Lab 08/04/2020 1233 08/25/20 0426 08/26/20 0918 08/27/20 0349 08/29/20 1253  WBC 9.8 8.8 11.2* 11.5* 19.7*  NEUTROABS 6.7  --  7.5  --  17.3*  HGB 11.8* 9.3* 9.7* 9.2* 9.8*  HCT 37.6 29.6* 30.2* 29.3* 30.9*  MCV 94.5 94.0 92.6 93.3 93.6  PLT 274 243 279 313 622   Basic Metabolic Panel: Recent Labs  Lab 08/09/2020 1233 08/25/20 0426 08/26/20 0918 08/27/20 0349 08/29/20 1253  NA 143 142 143 143 151*  K 4.2 3.6 3.6 3.4* 3.2*  CL 108 113* 113* 113* 119*  CO2 22 21* 20* 20* 22  GLUCOSE 99 92 75 84 104*  BUN 36* 32* 25* 27* 38*  CREATININE 1.72* 1.26* 1.04* 1.07* 0.85  CALCIUM 9.7 8.6* 9.1 9.0 9.8   Studies: DG CHEST PORT 1 VIEW  Result Date: 08/29/2020 CLINICAL DATA:  Shortness of breath EXAM: PORTABLE CHEST 1 VIEW COMPARISON:  August 27, 2020 FINDINGS: There has been partial clearing of airspace opacity from the left lower lobe. Ill-defined opacity in the right lower lobe persists. Heart is upper normal in size with pulmonary vascular normal. There is a large hiatal type hernia. There is aortic atherosclerosis. No adenopathy. No bone lesions. IMPRESSION: Persistent right lower lobe airspace opacity consistent with pneumonia. Partial clearing of opacity from the left base. No new opacity evident. Sizable hiatal type hernia. Stable cardiac silhouette. Aortic Atherosclerosis (ICD10-I70.0). Electronically Signed   By: Lowella Grip III M.D.   On: 08/29/2020 09:09    Scheduled Meds: . amLODipine  5 mg Oral Daily  . enoxaparin (LOVENOX) injection  40 mg Subcutaneous Daily  . feeding  supplement (KATE FARMS STANDARD 1.4)  325 mL Oral BID BM  . guaiFENesin  600 mg Oral BID  . levothyroxine  50 mcg Oral Q0600  . mometasone-formoterol  2 puff Inhalation BID  . pantoprazole  40 mg Oral Q1200  . predniSONE  30 mg Oral Q breakfast  . QUEtiapine  25 mg Oral QHS   Continuous Infusions: . cefTRIAXone (ROCEPHIN)  IV 2 g (08/29/20 1502)  . dextrose    . metronidazole 500 mg (08/29/20 1102)   PRN Meds: albuterol, hydrALAZINE, ondansetron **OR** ondansetron (ZOFRAN) IV  Time spent: 35 minutes  Author: Berle Mull, MD [facility] 08/29/2020 3:28 PM  To reach On-call, see care teams to locate the attending and reach out via [email or web address]. Between 7PM-7AM, please contact night-coverage If you still have difficulty reaching the attending provider, please page the [facility] (Director on Call) for [facility] on amion for assistance.

## 2020-08-29 NOTE — Progress Notes (Signed)
Patient is agitated and refusing medication at this time.  Dr. Posey Pronto aware.

## 2020-08-29 NOTE — Progress Notes (Signed)
   08/29/20 0739  Assess: MEWS Score  Temp 98 F (36.7 C)  BP (!) 161/85  Pulse Rate 80  Resp (!) 36  SpO2 95 %  Assess: MEWS Score  MEWS Temp 0  MEWS Systolic 0  MEWS Pulse 0  MEWS RR 3  MEWS LOC 0  MEWS Score 3  MEWS Score Color Yellow  Assess: if the MEWS score is Yellow or Red  Were vital signs taken at a resting state? Yes  Focused Assessment Change from prior assessment (see assessment flowsheet)  Does the patient meet 2 or more of the SIRS criteria? No  MEWS guidelines implemented *See Row Information* Yes  Treat  MEWS Interventions Escalated (See documentation below);Consulted Respiratory Therapy  Pain Scale 0-10  Pain Score 0  Take Vital Signs  Increase Vital Sign Frequency  Yellow: Q 2hr X 2 then Q 4hr X 2, if remains yellow, continue Q 4hrs  Escalate  MEWS: Escalate Yellow: discuss with charge nurse/RN and consider discussing with provider and RRT  Notify: Charge Nurse/RN  Name of Charge Nurse/RN Notified Chancy Hurter RN  Date Charge Nurse/RN Notified 08/29/20  Time Charge Nurse/RN Notified 4627  Notify: Provider  Provider Name/Title Dr. Posey Pronto  Date Provider Notified 08/29/20  Time Provider Notified 0740  Notification Type Page  Notification Reason Change in status  Provider response At bedside;See new orders  Date of Provider Response 08/29/20  Time of Provider Response [phone]  Document  Patient Outcome Stabilized after interventions  Progress note created (see row info) Yes  Assess: SIRS CRITERIA  SIRS Temperature  0  SIRS Pulse 0  SIRS Respirations  1  SIRS WBC 0  SIRS Score Sum  1

## 2020-08-29 NOTE — Evaluation (Signed)
Clinical/Bedside Swallow Evaluation Patient Details  Name: Pamela Mejia MRN: [id number] Date of Birth: 02-08-1927  Today's Date: 08/29/2020 Time: SLP Start Time (ACUTE ONLY): 4650 SLP Stop Time (ACUTE ONLY): 1755 SLP Time Calculation (min) (ACUTE ONLY): 20 min  Past Medical History:  Past Medical History:  Diagnosis Date  . Anemia   . Arthritis   . Asthma   . Blind right eye   . Blood clotting disorder ([place])   . Cancer ([place])    Ocular melanoma  . Cataract    left  . Complication of anesthesia    has a soy allergy  . Diverticulitis   . Diverticulosis   . Gallstones   . GERD (gastroesophageal reflux disease)   . Hypertension   . Hypothyroidism   . Inguinal hernia    left  . Kidney stones   . Status post dilation of esophageal narrowing    Past Surgical History:  Past Surgical History:  Procedure Laterality Date  . ABDOMINAL HYSTERECTOMY  1966  . BLADDER SURGERY  2008  . BREAST BIOPSY Bilateral   . BUNIONECTOMY Right   . CHOLECYSTECTOMY  1996      . COLONOSCOPY    . COLOSTOMY  01/2015  . COLOSTOMY TAKEDOWN  09/23/2015  . COLOSTOMY TAKEDOWN N/A 09/23/2015   Procedure: COLOSTOMY TAKEDOWN;  Surgeon: Stark Klein, MD;  Location: [facility];  Service: General;  Laterality: N/A;  . DIAGNOSTIC LAPAROSCOPY  01/2015   sigmoid colectomy with takedown of splenic flexure and end colostomy   . EYE SURGERY  1994   radioactive plaque  . HERNIA REPAIR  09/23/2015   PARASTOMAL   . LAPAROSCOPY N/A 02/03/2015   Procedure: DIAGNOSTIC LAPAROSCOPY/OPEN SIGMOID COLECTOMY WITH TAKE DOWN OF SPLENIC FLEXURE AND END COLOSTOMY;  Surgeon: Stark Klein, MD;  Location: [facility];  Service: General;  Laterality: N/A;  . PARASTOMAL HERNIA REPAIR N/A 09/23/2015   Procedure: HERNIA REPAIR PARASTOMAL;  Surgeon: Stark Klein, MD;  Location: [facility];  Service: General;  Laterality: N/A;  . throat biopsy     "had stones in my throat; had them in alot of places"  . TRIGGER FINGER RELEASE     HPI:  Patient is a 87  y.o. female with PMH: HTN, hypothryoidism, HLD who presented to hospital with cough and SOB; cough had been going on for past several weeks and although is productive, patient is not able to effectively clear it. SLP evaluated her during current admission on 6/1 for BSE and at that time, patient and her son both declined objective swallow study (MBS), opting to mitgate dysphagia through recommended strategies. In addition, SLP at that time suspected patient's dysphagia was primarily esophageal. SLP had s/o on patient but now BSE has been reordered as patient has been exhibiting delirium, frequent coughing with PO's and with CXR showing worsening PNA.   Assessment / Plan / Recommendation Clinical Impression  Patient presents with a moderate oropharyngeal dysphagia with observed immediate and prolonged coughing after cup and straw sips thin liquids. No solids were trialed as patient has been refusing and has been with some delirium and fatigue. SLP suspects that some of coughing is not related completely to dysphagia but is consistent with secretions which were audible when patient coughing. She was unable to transit any secretions to expectorate during this assessment. SLP spoke with RN and advised to focus on giving patient necessary medications and only give her PO's if she is alert and requesting. SLP plans to see patient next date and recommending MBS  to r/o aspiration. SLP Visit Diagnosis: Dysphagia, unspecified (R13.10)    Aspiration Risk  Moderate aspiration risk    Diet Recommendation Dysphagia 3 (Mech soft)   Liquid Administration via: Cup;Straw Medication Administration: Whole meds with puree Supervision: Full supervision/cueing for compensatory strategies;Staff to assist with self feeding Compensations: Minimize environmental distractions;Slow rate;Small sips/bites Postural Changes: Seated upright at 90 degrees;Remain upright for at least 30 minutes after po intake    Other   Recommendations Oral Care Recommendations: Oral care BID;Staff/trained caregiver to provide oral care   Follow up Recommendations Other (comment) (TBD)      Frequency and Duration min 1 x/week  1 week       Prognosis Prognosis for Safe Diet Advancement: Good      Swallow Study   General Date of Onset: 08/29/20 HPI: Patient is a 87 y.o. female with PMH: HTN, hypothryoidism, HLD who presented to hospital with cough and SOB; cough had been going on for past several weeks and although is productive, patient is not able to effectively clear it. SLP evaluated her during current admission on 6/1 for BSE and at that time, patient and her son both declined objective swallow study (MBS), opting to mitgate dysphagia through recommended strategies. In addition, SLP at that time suspected patient's dysphagia was primarily esophageal. SLP had s/o on patient but now BSE has been reordered as patient has been exhibiting delirium, frequent coughing with PO's and with CXR showing worsening PNA. Type of Study: Bedside Swallow Evaluation Previous Swallow Assessment: 6/1 Diet Prior to this Study: Dysphagia 3 (soft);Thin liquids Temperature Spikes Noted: No Respiratory Status: Nasal cannula History of Recent Intubation: No Behavior/Cognition: Alert;Cooperative;Pleasant mood;Confused Oral Cavity Assessment: Within Functional Limits Oral Care Completed by SLP: No Oral Cavity - Dentition: Dentures, top;Other (Comment) (bottom dentures not in place as per RN, they keep falling out and patient seems to be pushing them out of mouth) Self-Feeding Abilities: Total assist Patient Positioning: Upright in bed Baseline Vocal Quality: Hoarse Volitional Cough: Weak Volitional Swallow: Unable to elicit    Oral/Motor/Sensory Function Overall Oral Motor/Sensory Function: Within functional limits   Ice Chips     Thin Liquid Thin Liquid: Impaired Presentation: Cup;Straw Oral Phase Impairments: Reduced labial seal;Poor  awareness of bolus Oral Phase Functional Implications: Prolonged oral transit;Other (comment);Right anterior spillage;Left anterior spillage Pharyngeal  Phase Impairments: Suspected delayed Swallow;Cough - Immediate;Cough - Delayed    Nectar Thick     Honey Thick     Puree Puree: Not tested   Solid     Solid: Not tested     Sonia Baller, MA, CCC-SLP Speech Therapy

## 2020-08-30 LAB — BASIC METABOLIC PANEL
Anion gap: 11 (ref 5–15)
Anion gap: 10 (ref 5–15)
Anion gap: 8 (ref 5–15)
Anion gap: 9 (ref 5–15)
BUN: 37 mg/dL — ABNORMAL HIGH (ref 8–23)
BUN: 34 mg/dL — ABNORMAL HIGH (ref 8–23)
BUN: 36 mg/dL — ABNORMAL HIGH (ref 8–23)
BUN: 34 mg/dL — ABNORMAL HIGH (ref 8–23)
CO2: 21 mmol/L — ABNORMAL LOW (ref 22–32)
CO2: 20 mmol/L — ABNORMAL LOW (ref 22–32)
CO2: 22 mmol/L (ref 22–32)
CO2: 21 mmol/L — ABNORMAL LOW (ref 22–32)
Calcium: 9.5 mg/dL (ref 8.9–10.3)
Calcium: 9.5 mg/dL (ref 8.9–10.3)
Calcium: 9.4 mg/dL (ref 8.9–10.3)
Calcium: 9 mg/dL (ref 8.9–10.3)
Chloride: 117 mmol/L — ABNORMAL HIGH (ref 98–111)
Chloride: 117 mmol/L — ABNORMAL HIGH (ref 98–111)
Chloride: 118 mmol/L — ABNORMAL HIGH (ref 98–111)
Chloride: 117 mmol/L — ABNORMAL HIGH (ref 98–111)
Creatinine, Ser: 1.08 mg/dL — ABNORMAL HIGH (ref 0.44–1.00)
Creatinine, Ser: 1.03 mg/dL — ABNORMAL HIGH (ref 0.44–1.00)
Creatinine, Ser: 0.97 mg/dL (ref 0.44–1.00)
Creatinine, Ser: 1.07 mg/dL — ABNORMAL HIGH (ref 0.44–1.00)
GFR, Estimated: 48 mL/min — ABNORMAL LOW (ref >60)
GFR, Estimated: 51 mL/min — ABNORMAL LOW (ref >60)
GFR, Estimated: 55 mL/min — ABNORMAL LOW (ref >60)
GFR, Estimated: 49 mL/min — ABNORMAL LOW (ref >60)
Glucose, Bld: 125 mg/dL — ABNORMAL HIGH (ref 70–99)
Glucose, Bld: 127 mg/dL — ABNORMAL HIGH (ref 70–99)
Glucose, Bld: 131 mg/dL — ABNORMAL HIGH (ref 70–99)
Glucose, Bld: 180 mg/dL — ABNORMAL HIGH (ref 70–99)
Potassium: 3.2 mmol/L — ABNORMAL LOW (ref 3.5–5.1)
Potassium: 3.1 mmol/L — ABNORMAL LOW (ref 3.5–5.1)
Potassium: 2.8 mmol/L — ABNORMAL LOW (ref 3.5–5.1)
Potassium: 3 mmol/L — ABNORMAL LOW (ref 3.5–5.1)
Sodium: 149 mmol/L — ABNORMAL HIGH (ref 135–145)
Sodium: 147 mmol/L — ABNORMAL HIGH (ref 135–145)
Sodium: 148 mmol/L — ABNORMAL HIGH (ref 135–145)
Sodium: 147 mmol/L — ABNORMAL HIGH (ref 135–145)

## 2020-08-30 MED ORDER — ZOLPIDEM TARTRATE 5 MG PO TABS
5.0000 mg | ORAL_TABLET | Freq: Every day | ORAL | Status: DC
  Administered 2020-08-30: 5 mg via ORAL
  Filled 2020-08-30 (×2): qty 1

## 2020-08-30 MED ORDER — HALOPERIDOL LACTATE 5 MG/ML IJ SOLN: 2.0000 mg | Freq: Four times a day (QID) | INTRAMUSCULAR | Status: DC | PRN

## 2020-08-30 MED ORDER — RISPERIDONE 0.5 MG PO TABS
0.5000 mg | ORAL_TABLET | Freq: Every day | ORAL | Status: DC
  Administered 2020-08-30 – 2020-09-02 (×3): 0.5 mg via ORAL
  Filled 2020-08-30 (×4): qty 1

## 2020-08-30 NOTE — Progress Notes (Signed)
Pt is confused and is attempting to pull at IV. Safety mitts were removed by pt as well as tele. Reoriented pt and put telemetry and safety mitts back on pt. Pt situated in bed.

## 2020-08-30 NOTE — Progress Notes (Signed)
Pt yelling out.  Arrived to room with pt gown, tele, oxygen, and purwick removed.  Pt situated in bed and cleaned.  Tele, gown, and oxygen put back on.  Pt O2 sats remain stable currently.  Pt reoriented and repositioned.

## 2020-08-30 NOTE — Care Management Important Message (Signed)
Important Message  Patient Details IM Letter given to the Patient. Name: Pamela Mejia MRN: [id number] Date of Birth: [birth year]   Medicare Important Message Given:  Yes     Kerin Salen 08/30/2020, 12:40 PM

## 2020-08-30 NOTE — Progress Notes (Signed)
Pt yelling out in room. Assessed the situation.  Pt is confused and answers to name only. Medications to be given per md order to help with rest.  Pt remains with safety mitts on and tele currently.

## 2020-08-30 NOTE — Progress Notes (Signed)
  Speech Language Pathology Treatment: Dysphagia  Patient Details Name: Pamela Mejia MRN: [id number] DOB: 05/26/1926 Today's Date: 08/30/2020 Time: 3570-1779 SLP Time Calculation (min) (ACUTE ONLY): 30 min  Assessment / Plan / Recommendation Clinical Impression  Patient seen to address dysphagia goals with RN and patient's son present in room. Patient had a restless night and has not been able to sleep well during this hospitalization and per son she was having some difficulties with that recently at home as well. Patient was able to maintain alertness for PO's of medications (whole and crushed) in pudding and cup sips of thin liquids (water). Straw was attempted but patient unable to achieve adequate suction. Patient exhibited mild amount of delayed throat clearing, however this appears more associated with phlegm/secretions as opposed to from thin liquids. She was able to cough and expectorate very thick, sticky light yellow secretions which she spit out into towel. SLP then set up suction and assisted patient with removal of top dentures for cleaning. Mild amount of what appeared to be wet secretions removed from hard palate and from denture palate. Patient's breathing did appear more clear after expectoration of secretions. SLP is recommending downgrade solids to Dys 1, continue with thin liquids and consider MBS next date if patient is alert enough.    HPI HPI: Patient is a 87 y.o. female with PMH: HTN, hypothryoidism, HLD who presented to hospital with cough and SOB; cough had been going on for past several weeks and although is productive, patient is not able to effectively clear it. SLP evaluated her during current admission on 6/1 for BSE and at that time, patient and her son both declined objective swallow study (MBS), opting to mitgate dysphagia through recommended strategies. In addition, SLP at that time suspected patient's dysphagia was primarily esophageal. SLP had s/o on patient but now BSE  has been reordered as patient has been exhibiting delirium, frequent coughing with PO's and with CXR showing worsening PNA.      SLP Plan  Continue with current plan of care       Recommendations  Diet recommendations: Dysphagia 1 (puree);Thin liquid Liquids provided via: Cup (can try with straw but patient has difficulty with suction) Medication Administration: Whole meds with puree Supervision: Full supervision/cueing for compensatory strategies;Staff to assist with self feeding Compensations: Minimize environmental distractions;Small sips/bites;Slow rate Postural Changes and/or Swallow Maneuvers: Seated upright 90 degrees;Upright 30-60 min after meal                Oral Care Recommendations: Oral care BID;Staff/trained caregiver to provide oral care Follow up Recommendations: Other (comment) (TBD) SLP Visit Diagnosis: Dysphagia, unspecified (R13.10) Plan: Continue with current plan of care       Sonia Baller, MA, CCC-SLP Speech Therapy

## 2020-08-30 NOTE — Progress Notes (Signed)
[facility] Progress Note  Patient: Pamela Mejia    ZPH:[id number]  DOA: 08/14/2020     Date of Service: the patient was seen and examined on 08/30/2020  Brief hospital course: PMH  HTN, hypothyroidism, HLD. Presented with shortness of breath and found to have asthma exacerbation.  Remains confused and agitated with poor p.o. intake. Currently plan is treat asthma situation, treat hypernatremia and treat for improvement in delirium.  Assessment and Plan: 1.  Right lower lobe pneumonia. Asthma exacerbation. Patient has been dealing with this since May 4. Has completed 2 courses of antibiotics and this is her third time. Chest x-ray shows multifocal pneumonia which is worsening although appears to be about the same clinically. COVID test is negative. Influenza negative as well. Treated with IV steroids. Now on p.o. steroids.  Dose reduced secondary to delirium. Continue nebulizer therapy.    2.  Acute metabolic encephalopathy. Mentation progressively worsening of her worsening hypoxia and initiation of the steroids. Patient has not been able to sleep properly for last 48 hours. Agitation primarily occurs at night. Suspect delirium in the setting of hospitalization steroid use and hypoxia. Unable to sleep with Seroquel.  We will modify it to risperidone plus Ambien.  Monitor. Reconsult speech therapy as there is a concern for the patient's mentation change is limiting her ability to swallow safely.  3.  Essential hypertension Blood pressure stable. Continue Norvasc.  4.  AKI Baseline serum creatinine around 1.4. On presentation serum creatinine 1.7 Improving. Holding losartan.  5.  Hypothyroidism Continue Synthroid.  6.  Moderate protein calorie malnutrition Placing the patient at high risk for poor outcome. Continue supplements. Body mass index is 23.16 kg/m.  Nutrition Problem: Moderate Malnutrition Etiology: acute illness (CAP) Interventions: Interventions:  Refer to RD note for recommendations   7.  Anemia Baseline hemoglobin around 11-12. Currently stable around 9.8. We will continue to monitor.  8.  Hypernatremia. Hypokalemia Likely from poor p.o. intake. Will initiate IV D5 infusion. Monitor sodium  Currently being replaced  Diet: Dysphagia 3 thin liquid diet DVT Prophylaxis:   enoxaparin (LOVENOX) injection 30 mg Start: 08/30/20 1000 SCDs Start: 08/22/2020 1756   Advance goals of care discussion: DNR  Family Communication: family was present at bedside, at the time of interview.   Disposition:  Status is: Inpatient  Remains inpatient appropriate because:IV treatments appropriate due to intensity of illness or inability to take PO and Inpatient level of care appropriate due to severity of illness  Dispo: The patient is from: Home              Anticipated d/c is to: SNF              Patient currently is not medically stable to d/c.   Difficult to place patient No   Subjective: Confusion and fatigue remains.  No nausea no vomiting.  Poor p.o. intake.  No chest pain.  Abdominal pain.  Physical Exam:  General: Appear in mild distress, no Rash; Oral Mucosa Clear, moist. no Abnormal Neck Mass Or lumps, Conjunctiva normal  Cardiovascular: S1 and S2 Present, no Murmur, Respiratory: good respiratory effort, Bilateral Air entry present and CTA, no Crackles, no wheezes Abdomen: Bowel Sound present, Soft and no tenderness Extremities: no Pedal edema Neurology: alert and oriented to place, and person affect appropriate. no new focal deficit Gait not checked due to patient safety concerns   Vitals:   08/29/20 1959 08/29/20 2000 08/30/20 0805 08/30/20 1334  BP:    (!) 149/79  Pulse:    85  Resp:  19  (!) 22  Temp:    98.3 F (36.8 C)  TempSrc:    Axillary  SpO2: 98%  93% 92%  Weight:      Height:        Intake/Output Summary (Last 24 hours) at 08/30/2020 1909 Last data filed at 08/30/2020 1841 Gross per 24 hour  Intake 120 ml   Output 250 ml  Net -130 ml   Filed Weights   08/04/2020 1157  Weight: 53.8 kg   Data Reviewed: I have personally reviewed and interpreted daily labs, tele strips, imaging. I reviewed all nursing notes, pharmacy notes, vitals, pertinent old records I have discussed plan of care as described above with RN and patient/family.  CBC: Recent Labs  Lab 08/11/2020 1233 08/25/20 0426 08/26/20 0918 08/27/20 0349 08/29/20 1253  WBC 9.8 8.8 11.2* 11.5* 19.7*  NEUTROABS 6.7  --  7.5  --  17.3*  HGB 11.8* 9.3* 9.7* 9.2* 9.8*  HCT 37.6 29.6* 30.2* 29.3* 30.9*  MCV 94.5 94.0 92.6 93.3 93.6  PLT 274 243 279 313 646   Basic Metabolic Panel: Recent Labs  Lab 08/29/20 1849 08/30/20 0051 08/30/20 0822 08/30/20 1202 08/30/20 1825  NA 150* 149* 147* 148* 147*  K 3.1* 3.2* 3.1* 2.8* 3.0*  CL 121* 117* 117* 118* 117*  CO2 20* 21* 20* 22 21*  GLUCOSE 120* 125* 127* 131* 180*  BUN 37* 37* 34* 36* 34*  CREATININE 1.01* 1.08* 1.03* 0.97 1.07*  CALCIUM 9.3 9.5 9.5 9.4 9.0   Studies: No results found.  Scheduled Meds: . amLODipine  5 mg Oral Daily  . enoxaparin (LOVENOX) injection  30 mg Subcutaneous Daily  . feeding supplement (KATE FARMS STANDARD 1.4)  325 mL Oral BID BM  . guaiFENesin  600 mg Oral BID  . levothyroxine  50 mcg Oral Q0600  . mometasone-formoterol  2 puff Inhalation BID  . pantoprazole  40 mg Oral Q1200  . predniSONE  30 mg Oral Q breakfast  . risperiDONE  0.5 mg Oral QHS  . zolpidem  5 mg Oral QHS   Continuous Infusions: . cefTRIAXone (ROCEPHIN)  IV 2 g (08/30/20 1456)  . dextrose 50 mL/hr at 08/30/20 1453  . metronidazole 500 mg (08/30/20 1610)   PRN Meds: albuterol, haloperidol lactate, hydrALAZINE, ondansetron **OR** ondansetron (ZOFRAN) IV  Time spent: 35 minutes  Author: Berle Mull, MD [facility] 08/30/2020 7:09 PM  To reach On-call, see care teams to locate the attending and reach out via [email or web address]. Between 7PM-7AM, please contact  night-coverage If you still have difficulty reaching the attending provider, please page the [facility] (Director on Call) for [facility] on amion for assistance.

## 2020-08-30 NOTE — Plan of Care (Signed)

## 2020-08-31 ENCOUNTER — Inpatient Hospital Stay (HOSPITAL_COMMUNITY): Payer: Medicare Other

## 2020-08-31 LAB — CBC WITH DIFFERENTIAL/PLATELET
Abs Immature Granulocytes: 3.47 10*3/uL — ABNORMAL HIGH (ref 0.00–0.07)
Basophils Absolute: 0 10*3/uL (ref 0.0–0.1)
Basophils Relative: 0 %
Eosinophils Absolute: 0 10*3/uL (ref 0.0–0.5)
Eosinophils Relative: 0 %
HCT: 33.1 % — ABNORMAL LOW (ref 36.0–46.0)
Hemoglobin: 11 g/dL — ABNORMAL LOW (ref 12.0–15.0)
Immature Granulocytes: 16 %
Lymphocytes Relative: 7 %
Lymphs Abs: 1.5 10*3/uL (ref 0.7–4.0)
MCH: 29.8 pg (ref 26.0–34.0)
MCHC: 33.2 g/dL (ref 30.0–36.0)
MCV: 89.7 fL (ref 80.0–100.0)
Monocytes Absolute: 0.8 10*3/uL (ref 0.1–1.0)
Monocytes Relative: 4 %
Neutro Abs: 15.6 10*3/uL — ABNORMAL HIGH (ref 1.7–7.7)
Neutrophils Relative %: 73 %
Platelets: 222 10*3/uL (ref 150–400)
RBC: 3.69 MIL/uL — ABNORMAL LOW (ref 3.87–5.11)
RDW: 14.6 % (ref 11.5–15.5)
WBC: 21.4 10*3/uL — ABNORMAL HIGH (ref 4.0–10.5)
nRBC: 0.8 % — ABNORMAL HIGH (ref 0.0–0.2)

## 2020-08-31 LAB — COMPREHENSIVE METABOLIC PANEL
ALT: 37 U/L (ref 0–44)
AST: 58 U/L — ABNORMAL HIGH (ref 15–41)
Albumin: 2.3 g/dL — ABNORMAL LOW (ref 3.5–5.0)
Alkaline Phosphatase: 189 U/L — ABNORMAL HIGH (ref 38–126)
Anion gap: 9 (ref 5–15)
BUN: 30 mg/dL — ABNORMAL HIGH (ref 8–23)
CO2: 20 mmol/L — ABNORMAL LOW (ref 22–32)
Calcium: 9.1 mg/dL (ref 8.9–10.3)
Chloride: 112 mmol/L — ABNORMAL HIGH (ref 98–111)
Creatinine, Ser: 0.94 mg/dL (ref 0.44–1.00)
GFR, Estimated: 57 mL/min — ABNORMAL LOW (ref >60)
Glucose, Bld: 134 mg/dL — ABNORMAL HIGH (ref 70–99)
Potassium: 3.2 mmol/L — ABNORMAL LOW (ref 3.5–5.1)
Sodium: 141 mmol/L (ref 135–145)
Total Bilirubin: 0.7 mg/dL (ref 0.3–1.2)
Total Protein: 5.6 g/dL — ABNORMAL LOW (ref 6.5–8.1)

## 2020-08-31 MED ORDER — ADULT MULTIVITAMIN W/MINERALS CH
1.0000 | ORAL_TABLET | Freq: Every day | ORAL | Status: DC
  Administered 2020-09-02: 1 via ORAL
  Filled 2020-08-31: qty 1

## 2020-08-31 MED ORDER — KATE FARMS STANDARD 1.4 PO LIQD
325.0000 mL | Freq: Every day | ORAL | Status: DC
  Administered 2020-09-02: 325 mL via ORAL
  Filled 2020-08-31 (×3): qty 325

## 2020-08-31 NOTE — Progress Notes (Signed)
Physical Therapy Treatment Patient Details Name: Pamela Mejia MRN: [id number] DOB: April 25, 1926 Today's Date: 08/31/2020    History of Present Illness 87 year old female admitted with right lower lobe pneumonia.  Past medical history significant for hypertension, hypothyroidism, hiatal hernia, ocular melanoma with Right eye blindness, Left cataract    PT Comments    Pt in bed on 2 lts difficult to arouse.  Assisted OOB to [facility] and back to bed was all she do. General transfer comment: required increased assist OOB to [facility].  Poor forward flex posture and increased WOB.  HR increased to 137 and RR to 29 Reported to RN "pt does not look good".   Follow Up Recommendations  SNF     Equipment Recommendations  None recommended by PT    Recommendations for Other Services       Precautions / Restrictions Precautions Precautions: Fall Restrictions Weight Bearing Restrictions: No    Mobility  Bed Mobility Overal bed mobility: Needs Assistance Bed Mobility: Supine to Sit;Sit to Supine     Supine to sit: Max assist Sit to supine: Max assist;Total assist   General bed mobility comments: pt required increased assist    Transfers Overall transfer level: Needs assistance Equipment used: Rolling walker (2 wheeled) Transfers: Sit to/from [facility] Sit to Stand: Max assist Stand pivot transfers: Max assist;Total assist       General transfer comment: required increased assist OOB to [facility].  Poor forward flex posture and increased WOB.  HR increased to 137 and RR to 29  Ambulation/Gait             General Gait Details: transfers only due to inability to supprot herself.   Stairs             Wheelchair Mobility    Modified Rankin (Stroke Patients Only)       Balance                                            Cognition Arousal/Alertness: Lethargic Behavior During Therapy: Flat affect                                    General Comments: AxO x 1 "wore out" visibly not feeling well.      Exercises      General Comments        Pertinent Vitals/Pain Pain Assessment: No/denies pain    Home Living                      Prior Function            PT Goals (current goals can now be found in the care plan section) Progress towards PT goals: Progressing toward goals    Frequency    Min 2X/week      PT Plan Current plan remains appropriate    Co-evaluation              AM-PAC PT "6 Clicks" Mobility   Outcome Measure  Help needed turning from your back to your side while in a flat bed without using bedrails?: A Lot Help needed moving from lying on your back to sitting on the side of a flat bed without using bedrails?: A Lot Help needed moving to and from a bed to a chair (  including a wheelchair)?: A Lot Help needed standing up from a chair using your arms (e.g., wheelchair or bedside chair)?: A Lot Help needed to walk in hospital room?: A Lot Help needed climbing 3-5 steps with a railing? : Total 6 Click Score: 11    End of Session Equipment Utilized During Treatment: Oxygen Activity Tolerance: Patient limited by fatigue Patient left: in bed;with call bell/phone within reach;with bed alarm set Nurse Communication: Mobility status (pt does not look good) PT Visit Diagnosis: Other abnormalities of gait and mobility (R26.89)     Time: 6222-9798 PT Time Calculation (min) (ACUTE ONLY): 25 min  Charges:  $Therapeutic Activity: 23-37 mins                     Rica Koyanagi  PTA Acute  Rehabilitation Services Pager      [phone] Office      [phone]

## 2020-08-31 NOTE — Progress Notes (Signed)
Nutrition Follow-up  DOCUMENTATION CODES:   Non-severe (moderate) malnutrition in context of acute illness/injury  INTERVENTION:  - will decrease Dillard [place] from BID to once/day. - will order Magic Cup BID with meals, each supplement provides 290 kcal and 9 grams of protein. - will order 2 cups of vanilla pudding with breakfast meals and 1 cup with dinner meals.  - will order 1 tablet multivitamin with minerals/day.  - weigh patient today.    NUTRITION DIAGNOSIS:   Moderate Malnutrition related to acute illness (CAP) as evidenced by moderate fat depletion,moderate muscle depletion. -ongoing  GOAL:   Patient will meet greater than or equal to 90% of their needs -unmet  MONITOR:   PO intake,Supplement acceptance,Labs,Weight trends  ASSESSMENT:   87 year old female with history of hypertension, hypothyroidism, had hiatal hernia presented to the ED with cough and shortness of breath, ongoing for several weeks, productive.  -She was recently treated a month ago for bronchitis with prednisone and doxycycline.  Diet changed from Heart Healthy, thin liquids to Dysphagia 3, thin liquids on 6/5 at 1200 and to Dysphagia 1, thin liquids yesterday at 1040.   Last RD visit/initial RD visit was on 6/1. On 6/1 she ate 75% of breakfast and 0% of both lunch and dinner; on 6/2 she ate 50% of breakfast and 100% of dinner; on 6/3 she ate 30% of lunch and 0% of dinner.   Patient noted to be a/o to self only. Able to talk with RN who reports that patient needs to be fed by staff. Patient did eat some breakfast today but was sleeping during lunch time.   She accepts Dillard [place] most of the times offered but only takes a few sips each time. RN reports that patient's son often orders meals for her and is very attentive when he visits. RN states patient greatly enjoys vanilla items.   SLP attempted to evaluate patient this AM but was unable d/t patient not awaking to gentle stimulation at that time.    She has not been weighed since admission on 5/31.    Labs reviewed; K: 3.2 mmol/l, Cl: 112 mmol/l, BUN: 30 mg/dl, GFR: 57 ml/min. Medications reviewed; 50 mcg oral synthroid/day, 40 mg oral protonix/day, 30 mg deltasone/day.    Diet Order:   Diet Order            DIET - DYS 1 Room service appropriate? Yes; Fluid consistency: Thin  Diet effective now                 EDUCATION NEEDS:   No education needs have been identified at this time  Skin:  Skin Assessment: Reviewed RN Assessment  Last BM:  5/30  Height:   Ht Readings from Last 1 Encounters:  08/07/2020 5' (1.524 m)    Weight:   Wt Readings from Last 1 Encounters:  08/07/2020 53.8 kg     Estimated Nutritional Needs:  Kcal:  1350-1550 Protein:  65-75g Fluid:  1.5L/day     Jarome Matin, MS, RD, LDN, CNSC Inpatient Clinical Dietitian RD pager # available in AMION  After hours/weekend pager # available in [facility]

## 2020-08-31 NOTE — Progress Notes (Signed)
Pt urine output 100 ml for entire PM shift. Pt bladder scanned X2 during shift, 137 ml and 65 ml. Md notified X2  No new orders at this time. Pt also elevated BP during shift, 182/97 HR 90. Medicated with PRN hydralazine, which was effective, reassessed BP of 151/92 HR 93 after PRN. MD made aware.

## 2020-08-31 NOTE — Plan of Care (Signed)
  Problem: Education: Goal: Knowledge of General Education information will improve Description Including pain rating scale, medication(s)/side effects and non-pharmacologic comfort measures Outcome: Progressing   

## 2020-08-31 NOTE — Progress Notes (Signed)
Pt less responsive since midday. Open eyes to verbal response. Decreased oral intake, sleeping more. Decreased urine output. MD updated. Will cont to monitor. SRP, RN

## 2020-08-31 NOTE — Progress Notes (Addendum)
[facility] Progress Note  Patient: Pamela Mejia    NLZ:[id number]  DOA: 08/14/2020     Date of Service: the patient was seen and examined on 08/31/2020  Brief hospital course: PMH  HTN, hypothyroidism, HLD. Presented with shortness of breath and found to have asthma exacerbation.  5/31 admitted for right lower lobe pneumonia 6/3 worsening respiratory distress overnight requiring nonrebreather 6/4-6/7 Remains confused and agitated with poor p.o. intake and unable to sleep at night. Currently plan is to continue pneumonia treatment, monitor for improvement in delirium and oral intake, continues to engage with family regarding goals of care.  Assessment and Plan: 1.  Right lower lobe pneumonia. Asthma exacerbation. Patient has been dealing with this since May 4. Has completed 2 courses of antibiotics and this is her third time. Chest x-ray shows multifocal pneumonia which is worsening although appears to be about the same clinically. COVID test is negative. Influenza negative as well. Treated with IV steroids. Now on p.o. steroids.  Dose reduced secondary to delirium. Continue nebulizer therapy.    2.  Acute metabolic encephalopathy. Sleep deprivation and agitation. Mentation progressively worsening of her worsening hypoxia and initiation of the steroids. Patient has not been able to sleep properly due to agitation Suspect delirium in the setting of hospitalization steroid use and hypoxia. Unable to sleep with Seroquel.  Now risperidone plus as needed Ambien.  3.  Essential hypertension Blood pressure stable. Continue Norvasc.  4.  AKI Baseline serum creatinine around 1.4. On presentation serum creatinine 1.7 Improving. Holding losartan.  5.  Hypothyroidism Continue Synthroid.  6.  Moderate protein calorie malnutrition Placing the patient at high risk for poor outcome. Continue supplements. Body mass index is 23.16 kg/m.  Nutrition Problem: Moderate  Malnutrition Etiology: acute illness (CAP) Interventions: Interventions: Magic cup,MVI,Other (Comment) [employer])   7.  Anemia of acute illness Baseline hemoglobin around 11-12. Currently stable around 9.8. No acute bleeding.  We will continue to monitor.  8.  Hypernatremia. Hypokalemia Likely from poor p.o. intake. Corrected with IV D5 infusion.  Monitor sodium  Currently being replaced  9.  Goals of care conversation. Patient appears to be progressively getting worse. Patient has first infection in early May, required another course of antibiotic in late May and then admitted to the hospital on May 31 for another infection. During the course of hospital stay she developed worsening respiratory distress likely secondary to aspiration event. Continues to have ongoing respiratory distress from pneumonia. Unable to sleep and remains agitated and delirious. Prognosis is guarded secondary to poor p.o. intake and risk for aspiration. Medications that will help with agitation will certainly increase the risk for aspiration due to psychotropic effects. At age of 38 she also has poor reserves with moderate protein calorie malnutrition while she is dealing with pneumonia for almost last 1 month. Recommended family to consider comfort care. Her son who is power of attorney is currently in [place] and traveling to [place].  Should be here on 6/8.  Palliative care consulted.  Recommend continue to engage with family regarding goals of care.  Diet: Dysphagia 3 thin liquid diet DVT Prophylaxis:   enoxaparin (LOVENOX) injection 30 mg Start: 08/30/20 1000 SCDs Start: 08/01/2020 1756   Advance goals of care discussion: DNR  Family Communication: Son was present at bedside, at the time of interview.   Disposition:  Status is: Inpatient  Remains inpatient appropriate because:IV treatments appropriate due to intensity of illness or inability to take PO and Inpatient level of care appropriate  due to  severity of illness  Dispo: The patient is from: Home              Anticipated d/c is to: SNF              Patient currently is not medically stable to d/c.   Difficult to place patient No   Subjective: Continues to have fatigue and tiredness.  Remains confused.  Less interactive today.  No nausea or vomiting.  Increased respiratory distress.  Physical Exam:  General: Appear in mild distress, no Rash; Oral Mucosa Clear, moist. no Abnormal Neck Mass Or lumps, Conjunctiva normal  Cardiovascular: S1 and S2 Present, no Murmur, Respiratory: increased respiratory effort, Bilateral Air entry present and bilateral  Crackles, Occasional wheezes Abdomen: Bowel Sound present, Soft and no tenderness Extremities: trace Pedal edema Neurology: Fatigue and oriented to person affect flat. no new focal deficit Gait not checked due to patient safety concerns   Vitals:   08/30/20 2311 08/31/20 0357 08/31/20 0750 08/31/20 1315  BP: (!) 151/92 (!) 151/86  (!) 148/79  Pulse: 93 77  84  Resp:  18  20  Temp:  98.6 F (37 C)  97.9 F (36.6 C)  TempSrc:  Oral  Axillary  SpO2:  (!) 77% 90% 95%  Weight:      Height:        Intake/Output Summary (Last 24 hours) at 08/31/2020 1951 Last data filed at 08/31/2020 1600 Gross per 24 hour  Intake 713.37 ml  Output 200 ml  Net 513.37 ml   Filed Weights   08/21/2020 1157  Weight: 53.8 kg   Data Reviewed: I have personally reviewed and interpreted daily labs, tele strips, imaging. I reviewed all nursing notes, pharmacy notes, vitals, pertinent old records I have discussed plan of care as described above with RN and patient/family.  CBC: Recent Labs  Lab 08/25/20 0426 08/26/20 0918 08/27/20 0349 08/29/20 1253 08/31/20 0842  WBC 8.8 11.2* 11.5* 19.7* 21.4*  NEUTROABS  --  7.5  --  17.3* 15.6*  HGB 9.3* 9.7* 9.2* 9.8* 11.0*  HCT 29.6* 30.2* 29.3* 30.9* 33.1*  MCV 94.0 92.6 93.3 93.6 89.7  PLT 243 279 313 324 409   Basic Metabolic Panel: Recent Labs   Lab 08/30/20 0051 08/30/20 0822 08/30/20 1202 08/30/20 1825 08/31/20 0842  NA 149* 147* 148* 147* 141  K 3.2* 3.1* 2.8* 3.0* 3.2*  CL 117* 117* 118* 117* 112*  CO2 21* 20* 22 21* 20*  GLUCOSE 125* 127* 131* 180* 134*  BUN 37* 34* 36* 34* 30*  CREATININE 1.08* 1.03* 0.97 1.07* 0.94  CALCIUM 9.5 9.5 9.4 9.0 9.1   Studies: DG CHEST PORT 1 VIEW  Result Date: 08/31/2020 CLINICAL DATA:  Shortness of breath EXAM: PORTABLE CHEST 1 VIEW COMPARISON:  Portable exam 1300 hours compared to 08/29/2020 FINDINGS: Enlargement of cardiac silhouette. Large hiatal hernia. Mediastinal contours and pulmonary vascularity otherwise normal. LEFT pleural effusion and increased atelectasis versus consolidation in lower LEFT lung. Mild infiltrate at RIGHT base persists. Upper lungs clear. Calcified granuloma LEFT upper lobe stable. No pneumothorax. Bones demineralized. IMPRESSION: LEFT pleural effusion with increased atelectasis versus consolidation LEFT lower lobe. Persistent RIGHT basilar infiltrate. Large hiatal hernia. Aortic Atherosclerosis (ICD10-I70.0). Electronically Signed   By: Lavonia Dana M.D.   On: 08/31/2020 14:48    Scheduled Meds: . amLODipine  5 mg Oral Daily  . enoxaparin (LOVENOX) injection  30 mg Subcutaneous Daily  . [START ON 09/01/2020] feeding supplement (KATE FARMS STANDARD  1.4)  325 mL Oral Daily  . guaiFENesin  600 mg Oral BID  . levothyroxine  50 mcg Oral Q0600  . mometasone-formoterol  2 puff Inhalation BID  . multivitamin with minerals  1 tablet Oral Daily  . pantoprazole  40 mg Oral Q1200  . predniSONE  30 mg Oral Q breakfast  . risperiDONE  0.5 mg Oral QHS  . zolpidem  5 mg Oral QHS   Continuous Infusions: . cefTRIAXone (ROCEPHIN)  IV 2 g (08/31/20 1337)  . dextrose 10 mL/hr at 08/31/20 1242  . metronidazole 500 mg (08/31/20 1032)   PRN Meds: albuterol, haloperidol lactate, hydrALAZINE, ondansetron **OR** ondansetron (ZOFRAN) IV  Time spent: 35 minutes  Author: Berle Mull, MD [facility] 08/31/2020 7:51 PM  To reach On-call, see care teams to locate the attending and reach out via [email or web address]. Between 7PM-7AM, please contact night-coverage If you still have difficulty reaching the attending provider, please page the [facility] (Director on Call) for [facility] on amion for assistance.

## 2020-08-31 NOTE — Progress Notes (Signed)
Pt sleeping most of day, ate all pudding and ice cream this am about 30%' no lunch, sleeping at intervals, taking few sips of supplement tolerating well, slight cough at end and cleared. Tol pudding and ice cream with meds without issues. Voided 100 cc or dark amber urine with one inct episode. Need to work on Flutter effort, level 2 on meter. Will cont to monitor. SRP, RN

## 2020-08-31 NOTE — Progress Notes (Signed)
SLP Cancellation Note  Patient Details Name: Pamela Mejia MRN: [id number] DOB: 12-11-1926   Cancelled treatment:       Reason Eval/Treat Not Completed: Other (comment);Fatigue/lethargy limiting ability to participate (pt did not awaken to gentle verbal/tactile stimulation, wheeze noted with sleeping, will continue efforts)  Kathleen Lime, MS [facility] SLP Acute Rehab Services Office [phone] Pager [phone]   Macario Golds 08/31/2020, 9:54 AM

## 2020-09-01 DIAGNOSIS — I10 Essential (primary) hypertension: Secondary | ICD-10-CM

## 2020-09-01 DIAGNOSIS — E440 Moderate protein-calorie malnutrition: Secondary | ICD-10-CM

## 2020-09-01 LAB — BASIC METABOLIC PANEL
Anion gap: 10 (ref 5–15)
BUN: 31 mg/dL — ABNORMAL HIGH (ref 8–23)
CO2: 19 mmol/L — ABNORMAL LOW (ref 22–32)
Calcium: 9.1 mg/dL (ref 8.9–10.3)
Chloride: 116 mmol/L — ABNORMAL HIGH (ref 98–111)
Creatinine, Ser: 0.92 mg/dL (ref 0.44–1.00)
GFR, Estimated: 59 mL/min — ABNORMAL LOW (ref >60)
Glucose, Bld: 98 mg/dL (ref 70–99)
Potassium: 2.9 mmol/L — ABNORMAL LOW (ref 3.5–5.1)
Sodium: 145 mmol/L (ref 135–145)

## 2020-09-01 LAB — CBC
HCT: 31 % — ABNORMAL LOW (ref 36.0–46.0)
Hemoglobin: 9.8 g/dL — ABNORMAL LOW (ref 12.0–15.0)
MCH: 29.3 pg (ref 26.0–34.0)
MCHC: 31.6 g/dL (ref 30.0–36.0)
MCV: 92.5 fL (ref 80.0–100.0)
Platelets: 212 10*3/uL (ref 150–400)
RBC: 3.35 MIL/uL — ABNORMAL LOW (ref 3.87–5.11)
RDW: 14.8 % (ref 11.5–15.5)
WBC: 22.3 10*3/uL — ABNORMAL HIGH (ref 4.0–10.5)
nRBC: 0.6 % — ABNORMAL HIGH (ref 0.0–0.2)

## 2020-09-01 LAB — MAGNESIUM: Magnesium: 1 mg/dL — ABNORMAL LOW (ref 1.7–2.4)

## 2020-09-01 MED ORDER — POTASSIUM CHLORIDE 10 MEQ/100ML IV SOLN
10.0000 meq | INTRAVENOUS | Status: AC
  Administered 2020-09-01 (×4): 10 meq via INTRAVENOUS
  Filled 2020-09-01 (×2): qty 100

## 2020-09-01 MED ORDER — POTASSIUM CHLORIDE 20 MEQ PO PACK: 40.0000 meq | PACK | Freq: Once | ORAL | Status: DC

## 2020-09-01 MED ORDER — ACETAMINOPHEN 325 MG PO TABS
650.0000 mg | ORAL_TABLET | Freq: Four times a day (QID) | ORAL | Status: DC | PRN
  Administered 2020-09-01 – 2020-09-02 (×2): 650 mg via ORAL
  Filled 2020-09-01: qty 2

## 2020-09-01 MED ORDER — MAGNESIUM SULFATE 2 GM/50ML IV SOLN
2.0000 g | Freq: Once | INTRAVENOUS | Status: AC
  Administered 2020-09-01: 2 g via INTRAVENOUS
  Filled 2020-09-01: qty 50

## 2020-09-01 MED ORDER — KCL IN DEXTROSE-NACL 10-5-0.45 MEQ/L-%-% IV SOLN
INTRAVENOUS | Status: DC
  Filled 2020-09-01 (×3): qty 1000

## 2020-09-01 MED ORDER — TRAMADOL HCL 50 MG PO TABS
50.0000 mg | ORAL_TABLET | Freq: Four times a day (QID) | ORAL | Status: DC | PRN
  Administered 2020-09-01: 50 mg via ORAL
  Filled 2020-09-01: qty 1

## 2020-09-01 MED ORDER — LOSARTAN POTASSIUM 50 MG PO TABS
50.0000 mg | ORAL_TABLET | Freq: Every day | ORAL | Status: DC
  Administered 2020-09-01 – 2020-09-02 (×2): 50 mg via ORAL
  Filled 2020-09-01 (×2): qty 1

## 2020-09-01 NOTE — Plan of Care (Signed)
  Problem: Safety: Goal: Ability to remain free from injury will improve Outcome: Progressing   Problem: Pain Managment: Goal: General experience of comfort will improve Outcome: Progressing   

## 2020-09-01 NOTE — Progress Notes (Signed)
Shift Summary:   Remained drowsy/lethargic the majority of the shift was abel to arouse to answer orientation questions, but not long to stay awake for medicine pass. Poor appetite, ate one cup of ice cream. Son visited during this shift. Received potassium replacement via IV x 4. Maintenance fluids going over 50 ml/hr. Given Hydralazine x 1 during this shift for elevated blood pressure with relief. Given Tylenol and Ultram for pain x 1 during this shift. No other needs identified. Will continue to monitor.

## 2020-09-01 NOTE — Progress Notes (Signed)
Patient appears to breathing labored. Respirations increased in the upper 20-30's. On 1L of nasal cannula, sats in lower 90's. Given PO Tylenol, reached out to MD for something stronger, as patient looks uncomfortable. At this time, no other pain regimen available. Will continue to monitor.

## 2020-09-01 NOTE — Progress Notes (Signed)
Patient unable to perform chest PT. Patient's BBS clear and diminished. RT will continue to monitor.

## 2020-09-01 NOTE — Progress Notes (Signed)
AM medicines held. Encouraged PO intake. Patient arouses and answer orientation questions, but drifts off to sleep slowly after. Patient resting on 1L of nasal canula, no other needs identified. Will continue to monitor.

## 2020-09-01 NOTE — Progress Notes (Signed)
[facility] Progress Note   Patient: Pamela Mejia    MWU:[id number]  DOA: 07/25/2020      Brief hospital course: PMH  HTN, hypothyroidism, HLD. Presented with shortness of breath and found to have asthma exacerbation and community-acquired pneumonia. 5/31 admitted for right lower lobe pneumonia 6/3 worsening respiratory distress overnight requiring nonrebreather 6/4-6/7 Remains confused and agitated with poor p.o. intake and unable to sleep at night.   Assessment and Plan:  Community-acquired pneumonia/acute asthma exacerbation Patient has been dealing with this since May 4. Has completed 2 courses of antibiotics. Chest x-ray shows multifocal pneumonia which is worsening although appears to be about the same clinically. COVID test is negative. Influenza negative as well.  Treated with IV steroids. Now on p.o. steroids.  Dose reduced secondary to delirium. Continue nebulizer therapy.   Currently requiring 1 L of oxygen by nasal cannula.  Saturations in the mid 90s.  Hopefully will be able to wean this off.  Remains on ceftriaxone and metronidazole as well.  We will do a 5-day course. Leukocytosis likely due to steroids.  Acute metabolic encephalopathy/Sleep deprivation and agitation. Mentation progressively worsening of her worsening hypoxia and initiation of the steroids. Patient has not been able to sleep properly due to agitation Suspect delirium in the setting of hospitalization steroid use and hypoxia. Unable to sleep with Seroquel.  Now risperidone plus as needed Ambien. May need to revisit use of Ambien depending on her mentation.  Essential hypertension Monitor blood pressures closely.  Continue amlodipine  Acute kidney injury Baseline serum creatinine around 1.4.  Seems to be at baseline.  Monitor closely.  Losartan was on hold.  Should be okay to resume.   Hypothyroidism Continue Synthroid.  Moderate protein calorie malnutrition Placing the patient at high risk  for poor outcome. Continue supplements. Body mass index is 23.16 kg/m.  Nutrition Problem: Moderate Malnutrition Etiology: acute illness (CAP) Interventions: Interventions: Magic cup,MVI,Other (Comment) [employer])   Anemia of acute illness Hemoglobin is low but stable.  No evidence for overt blood loss   Hypernatremia/Hypokalemia/hypomagnesemia Replace potassium.  Sodium level is normal.  Magnesium also noted to be low and will be repleted.    Goals of care Patient was getting progressively worse during the early part of this hospitalization especially with her worsening delirium.  Family was contacted.  One of the patient's son has in the room with her.  Patient seems to be little bit more responsive according to him.  Patient's other son was in [place] and is now in route to [place].  Palliative care was also consulted.   We will need to see Hello she does with her oral intake.  If her intake remains poor then prognosis will remain poor.  DVT Prophylaxis: Lovenox CODE STATUS: Full code Family Communication: Son was at bedside  Disposition: To be determined  Status is: Inpatient  Remains inpatient appropriate because:IV treatments appropriate due to intensity of illness or inability to take PO and Inpatient level of care appropriate due to severity of illness  Dispo: The patient is from: Home              Anticipated d/c is to: SNF              Patient currently is not medically stable to d/c.   Difficult to place patient No      Subjective:  Opens her eyes.  Answers a few questions.  Does not appear to be in any discomfort.   Physical Exam:  General appearance: Lethargic but arousable.  In no distress Resp: Normal effort at rest.  No use of accessory muscles.  Few crackles bilateral bases.  No wheezing or rhonchi. Cardio: S1-S2 is normal regular.  No S3-S4.  No rubs murmurs or bruit GI: Abdomen is soft.  Nontender nondistended.  Bowel sounds are present normal.   No masses organomegaly Extremities: No edema.  Moving all of her extremities Neurologic: No focal neurological deficits.      Vitals:   08/31/20 2047 09/01/20 0550 09/01/20 0601 09/01/20 0816  BP: (!) 146/85 (!) 168/84 (!) 155/87   Pulse: 93 83 86   Resp: (!) 23 (!) 22 17   Temp: 98.3 F (36.8 C) 97.8 F (36.6 C) 97.8 F (36.6 C)   TempSrc: Oral Oral Oral   SpO2: 96% 96% 96% 97%  Weight:      Height:        Intake/Output Summary (Last 24 hours) at 09/01/2020 1219 Last data filed at 09/01/2020 4825 Gross per 24 hour  Intake 839.3 ml  Output 100 ml  Net 739.3 ml   Filed Weights   07/29/2020 1157  Weight: 53.8 kg   Data:   CBC: Recent Labs  Lab 08/26/20 0918 08/27/20 0349 08/29/20 1253 08/31/20 0842 09/01/20 0419  WBC 11.2* 11.5* 19.7* 21.4* 22.3*  NEUTROABS 7.5  --  17.3* 15.6*  --   HGB 9.7* 9.2* 9.8* 11.0* 9.8*  HCT 30.2* 29.3* 30.9* 33.1* 31.0*  MCV 92.6 93.3 93.6 89.7 92.5  PLT 279 313 324 222 003   Basic Metabolic Panel: Recent Labs  Lab 08/30/20 0822 08/30/20 1202 08/30/20 1825 08/31/20 0842 09/01/20 0419  NA 147* 148* 147* 141 145  K 3.1* 2.8* 3.0* 3.2* 2.9*  CL 117* 118* 117* 112* 116*  CO2 20* 22 21* 20* 19*  GLUCOSE 127* 131* 180* 134* 98  BUN 34* 36* 34* 30* 31*  CREATININE 1.03* 0.97 1.07* 0.94 0.92  CALCIUM 9.5 9.4 9.0 9.1 9.1  MG  --   --   --   --  1.0*   Studies: DG CHEST PORT 1 VIEW  Result Date: 08/31/2020 CLINICAL DATA:  Shortness of breath EXAM: PORTABLE CHEST 1 VIEW COMPARISON:  Portable exam 1300 hours compared to 08/29/2020 FINDINGS: Enlargement of cardiac silhouette. Large hiatal hernia. Mediastinal contours and pulmonary vascularity otherwise normal. LEFT pleural effusion and increased atelectasis versus consolidation in lower LEFT lung. Mild infiltrate at RIGHT base persists. Upper lungs clear. Calcified granuloma LEFT upper lobe stable. No pneumothorax. Bones demineralized. IMPRESSION: LEFT pleural effusion with increased  atelectasis versus consolidation LEFT lower lobe. Persistent RIGHT basilar infiltrate. Large hiatal hernia. Aortic Atherosclerosis (ICD10-I70.0). Electronically Signed   By: Lavonia Dana M.D.   On: 08/31/2020 14:48    Scheduled Meds: . amLODipine  5 mg Oral Daily  . enoxaparin (LOVENOX) injection  30 mg Subcutaneous Daily  . feeding supplement (KATE FARMS STANDARD 1.4)  325 mL Oral Daily  . guaiFENesin  600 mg Oral BID  . levothyroxine  50 mcg Oral Q0600  . mometasone-formoterol  2 puff Inhalation BID  . multivitamin with minerals  1 tablet Oral Daily  . pantoprazole  40 mg Oral Q1200  . predniSONE  30 mg Oral Q breakfast  . risperiDONE  0.5 mg Oral QHS  . zolpidem  5 mg Oral QHS   Continuous Infusions: . cefTRIAXone (ROCEPHIN)  IV 2 g (08/31/20 1337)  . dextrose 10 mL/hr at 08/31/20 1242  . metronidazole 500 mg (09/01/20 0232)  PRN Meds: albuterol, haloperidol lactate, hydrALAZINE, ondansetron **OR** ondansetron (ZOFRAN) IV    Bonnielee Haff  [facility] 09/01/2020 12:19 PM  To reach On-call, see care teams to locate the attending and reach out via [email or web address]. Between 7PM-7AM, please contact night-coverage If you still have difficulty reaching the attending provider, please page the [facility] (Director on Call) for [facility] on amion for assistance.

## 2020-09-02 DIAGNOSIS — Z7189 Other specified counseling: Secondary | ICD-10-CM

## 2020-09-02 DIAGNOSIS — Z515 Encounter for palliative care: Secondary | ICD-10-CM

## 2020-09-02 DIAGNOSIS — Z66 Do not resuscitate: Secondary | ICD-10-CM

## 2020-09-02 DIAGNOSIS — R627 Adult failure to thrive: Secondary | ICD-10-CM

## 2020-09-02 LAB — BASIC METABOLIC PANEL
Anion gap: 7 (ref 5–15)
BUN: 31 mg/dL — ABNORMAL HIGH (ref 8–23)
CO2: 20 mmol/L — ABNORMAL LOW (ref 22–32)
Calcium: 9.1 mg/dL (ref 8.9–10.3)
Chloride: 116 mmol/L — ABNORMAL HIGH (ref 98–111)
Creatinine, Ser: 1.03 mg/dL — ABNORMAL HIGH (ref 0.44–1.00)
GFR, Estimated: 51 mL/min — ABNORMAL LOW (ref >60)
Glucose, Bld: 141 mg/dL — ABNORMAL HIGH (ref 70–99)
Potassium: 3.3 mmol/L — ABNORMAL LOW (ref 3.5–5.1)
Sodium: 143 mmol/L (ref 135–145)

## 2020-09-02 LAB — CBC
HCT: 32.9 % — ABNORMAL LOW (ref 36.0–46.0)
Hemoglobin: 10.3 g/dL — ABNORMAL LOW (ref 12.0–15.0)
MCH: 29.3 pg (ref 26.0–34.0)
MCHC: 31.3 g/dL (ref 30.0–36.0)
MCV: 93.5 fL (ref 80.0–100.0)
Platelets: 184 10*3/uL (ref 150–400)
RBC: 3.52 MIL/uL — ABNORMAL LOW (ref 3.87–5.11)
RDW: 15.1 % (ref 11.5–15.5)
WBC: 25.6 10*3/uL — ABNORMAL HIGH (ref 4.0–10.5)
nRBC: 0.6 % — ABNORMAL HIGH (ref 0.0–0.2)

## 2020-09-02 LAB — MAGNESIUM: Magnesium: 1.9 mg/dL (ref 1.7–2.4)

## 2020-09-02 MED ORDER — POTASSIUM CHLORIDE 10 MEQ/100ML IV SOLN
10.0000 meq | INTRAVENOUS | Status: AC
  Administered 2020-09-02 (×4): 10 meq via INTRAVENOUS
  Filled 2020-09-02 (×4): qty 100

## 2020-09-02 NOTE — Consult Note (Signed)
Consultation Note Date: 09/02/2020   Patient Name: Pamela Mejia  DOB: 10-20-1926  MRN: [id number]  Age / Sex: 87 y.o., female  PCP: Lorene Dy, MD Referring Physician: Bonnielee Haff, MD  Reason for Consultation: Establishing goals of care  HPI/Patient Profile: 87 y.o. female  with past medical history of  HTN, hypothyroidism, and HLD admitted on 08/09/2020 with cough and shortness of breath. She has had this issue for over a month and sought out different treatments with a recent worsening in symptoms. She has been treated for CAP and acute asthma exacerbation. Also with significant AMS/agitation throughout hospitalization.  Poor PO intake has continued throughout hospitalization. PMT consulted to discuss [place].   Clinical Assessment and Goals of Care: I have reviewed medical records including EPIC notes, labs and imaging, assessed the patient and then met with her 2 sons Pamela Mejia and Pamela Mejia to discuss diagnosis prognosis, GOC, EOL wishes, disposition and options.  I introduced Palliative Medicine as specialized medical care for people living with serious illness. It focuses on providing relief from the symptoms and stress of a serious illness. The goal is to improve quality of life for both the patient and the family.  As far as functional and nutritional status, family tells me of a decline since February. She had previously lived alone in an [place]. Her son tells me of ongoing issues with cough and shortness of breath since April - one hospitalization and went to PCP weekly/sometimes twice a week. Her functional status has declined over the past month - activity intolerance, new requirement for walker with all ambulation. She also had poor PO intake - difficulty swallowing at home. At least a ten pound weight loss since beginning of this year.   Her sons share about their experience seeing her throughout this 9 day hospitalization. They speak of her AMS  and poor PO intake. They tell me this morning is the best morning she's had in several days - she woke up lucid, made jokes with them. PO intake has improved but not much - only take a bite or two with each meal.    We discussed patient's current illness and what it means in the larger context of patient's on-going co-morbidities.  Natural disease trajectory and expectations at EOL were discussed. We discussed her ongoing respiratory issues along with her functional and nutritional decline.   I attempted to elicit values and goals of care important to the patient.  Th sons share that patient would not be interested in any sort of aggressive medical care. They both worry she would not want placement in a SNF. They know she would most like to be home but that is currently not feasible as they are not able to provide the level of support she needs in the home.   The difference between aggressive medical intervention and comfort care was considered in light of the patient's goals of care. We discussed option of continuing current care with hope of improving PO intake and moving to SNF for rehab with hopes of functional improvement. We also discussed option to focus solely on comfort and avoid all life prolonging measures. Discussed potential option of hospice facility. Sons take time to consider bother options and ask questions about each. Ultimately, we decided to see how patient does this evening and in AM. So far this afternoon she has been sleeping and unable to participate in conversation. They are hopeful she Pamela Mejia be more awake in AM like she was this AM and would like to  present her with options and hear her thoughts.   Discussed with family the importance of continued conversation with family and the medical providers regarding overall plan of care and treatment options, ensuring decisions are within the context of the patient's values and GOCs.    Questions and concerns were addressed. The family was  encouraged to call with questions or concerns.  Primary Decision Maker NEXT OF KIN - sons Pamela Mejia and Pamela Mejia Patient as able however mental status has been altered recently  SUMMARY OF RECOMMENDATIONS   - ongoing [place] discussions - to f/u tomorrow - sons plan to make decision tomorrow regarding rehab vs hospice depending on how evening and morning go  Code Status/Advance Care Planning: DNR  Prognosis:  Poor prognosis  Discharge Planning: To Be Determined      Primary Diagnoses: Present on Admission:  Pneumonia   I have reviewed the medical record, interviewed the patient and family, and examined the patient. The following aspects are pertinent.  Past Medical History:  Diagnosis Date   Anemia    Arthritis    Asthma    Blind right eye    Blood clotting disorder ([place])    Cancer ([place])    Ocular melanoma   Cataract    left   Complication of anesthesia    has a soy allergy   Diverticulitis    Diverticulosis    Gallstones    GERD (gastroesophageal reflux disease)    Hypertension    Hypothyroidism    Inguinal hernia    left   Kidney stones    Status post dilation of esophageal narrowing    Social History   Socioeconomic History   Marital status: Widowed    Spouse name: Not on file   Number of children: 4   Years of education: Not on file   Highest education level: Not on file  Occupational History   Occupation: retired Pharmacist, hospital  Tobacco Use   Smoking status: Never   Smokeless tobacco: Never  Substance and Sexual Activity   Alcohol use: No    Alcohol/week: 0.0 standard drinks   Drug use: No   Sexual activity: Not on file  Other Topics Concern   Not on file  Social History Narrative   Not on file   Social Determinants of Health   Financial Resource Strain: Not on file  Food Insecurity: Not on file  Transportation Needs: Not on file  Physical Activity: Not on file  Stress: Not on file  Social Connections: Not on file   Family History  Problem Relation  Age of Onset   Hypertension Mother    Alzheimer's disease Mother    Stroke Father    Stroke Daughter    Diabetes Son    Colon cancer Neg Hx    Scheduled Meds:  amLODipine  5 mg Oral Daily   enoxaparin (LOVENOX) injection  30 mg Subcutaneous Daily   feeding supplement (KATE FARMS STANDARD 1.4)  325 mL Oral Daily   guaiFENesin  600 mg Oral BID   levothyroxine  50 mcg Oral Q0600   losartan  50 mg Oral Daily   mometasone-formoterol  2 puff Inhalation BID   multivitamin with minerals  1 tablet Oral Daily   pantoprazole  40 mg Oral Q1200   predniSONE  30 mg Oral Q breakfast   risperiDONE  0.5 mg Oral QHS   Continuous Infusions:  cefTRIAXone (ROCEPHIN)  IV 2 g (09/02/20 1244)   dextrose 5 % and 0.45 % NaCl with KCl  10 mEq/L 50 mL/hr at 09/01/20 1516   metronidazole 500 mg (09/02/20 0507)   PRN Meds:.acetaminophen, albuterol, haloperidol lactate, hydrALAZINE, ondansetron **OR** ondansetron (ZOFRAN) IV, traMADol Allergies  Allergen Reactions   Eggs Or Egg-Derived Products Shortness Of Breath   Penicillins Shortness Of Breath and Rash    Patient is unable to answer PCN questions at this time. Has patient had a PCN reaction causing immediate rash, facial/tongue/throat swelling, SOB or lightheadedness with hypotension: Yes Has patient had a PCN reaction causing severe rash involving mucus membranes or skin necrosis: No Has patient had a PCN reaction that required hospitalization Yes Has patient had a PCN reaction occurring within the last 10 years: No If all of the above answers are "NO", then may proceed with Cephalosporin use.     Soy Allergy Hives   Tylenol With Codeine #3 [Acetaminophen-Codeine]     hyper   Review of Systems  Unable to perform ROS: Mental status change   Physical Exam Constitutional:      Comments: Sleeping, does not wake to voice   Pulmonary:     Effort: Pulmonary effort is normal.     Comments: Remains on 1 L nasal cannula Skin:    General: Skin is warm  and dry.    Vital Signs: BP (!) 142/83 (BP Location: Left Arm)   Pulse 93   Temp 98.6 F (37 C) (Oral)   Resp (!) 23   Ht 5' (1.524 m)   Wt 53.8 kg   SpO2 98%   BMI 23.16 kg/m  Pain Scale: Faces POSS *See Group Information*: 1-Acceptable,Awake and alert Pain Score: Asleep   SpO2: SpO2: 98 % O2 Device:SpO2: 98 % O2 Flow Rate: .O2 Flow Rate (L/min): 1 L/min  IO: Intake/output summary: No intake or output data in the 24 hours ending 09/02/20 1445  LBM: Last BM Date: 08/30/20 Baseline Weight: Weight: 53.8 kg Most recent weight: Weight: 53.8 kg     Palliative Assessment/Data: PPS 20% d/t PO intake    Time Total: 60 minutes Greater than 50%  of this time was spent counseling and coordinating care related to the above assessment and plan.  Juel Burrow, DNP, AGNP-C Palliative Medicine Team [phone] Pager: [phone]

## 2020-09-02 NOTE — TOC Progression Note (Signed)
Transition of Care [facility]) - Progression Note    Patient Details  Name: Emalea Mix MRN: [id number] Date of Birth: 1926-07-09  [facility] CM/SW Contact  Purcell Mouton, RN Phone Number: 09/02/2020, 1:51 PM  Clinical Narrative:    Pt was Faxed to SNF facilities.    Expected Discharge Plan: [place] Barriers to Discharge: Continued Medical Work up  Expected Discharge Plan and Services Expected Discharge Plan: [place] In-house Referral: Clinical Social Work   Post Acute Care Choice: [place] Living arrangements for the past 2 months: [place]                 DME Arranged: N/A DME Agency: NA                   Social Determinants of Health (SDOH) Interventions    Readmission Risk Interventions Readmission Risk Prevention Plan 08/26/2020  Transportation Screening Complete  PCP or Specialist Appt within 5-7 Days Complete  Home Care Screening Complete  Some recent data might be hidden

## 2020-09-02 NOTE — Progress Notes (Signed)
[facility] Progress Note   Patient: Pamela Mejia    WUG:[id number]  DOA: 07/31/2020      Brief hospital course: PMH  HTN, hypothyroidism, HLD. Presented with shortness of breath and found to have asthma exacerbation and community-acquired pneumonia. 5/31 admitted for right lower lobe pneumonia 6/3 worsening respiratory distress overnight requiring nonrebreather 6/4-6/7 Remains confused and agitated with poor p.o. intake and unable to sleep at night. Mentation appears to be improving.  Oral intake remains poor though better than before.   Assessment and Plan:  Community-acquired pneumonia/acute asthma exacerbation Patient has been dealing with this since May 4. Has completed 2 courses of antibiotics. Chest x-ray shows multifocal pneumonia which is worsening although appears to be about the same clinically. COVID test is negative. Influenza negative as well.  Treated with IV steroids. Now on p.o. steroids.  Dose reduced secondary to delirium. Continue nebulizer therapy.   Respiratory status is stable.  Remains on 1 L of oxygen by nasal cannula saturations noted to be in the 90s.  Remains on ceftriaxone and metronidazole.  Due to this being the third episode we are going to give her a 7-day course instead of a 5-day course. Patient was seen by speech therapy and is on a dysphagia 1 diet. Leukocytosis is most likely due to steroids.  She is afebrile.  Acute metabolic encephalopathy/Sleep deprivation and agitation. Patient had progressively worsening mentation.  Steroid dose was decreased.  Mentation seems to be better over the last 24 to 48 hours.  Did not tolerate Seroquel but seems to be better on respite on on a nightly basis.    Essential hypertension Continue amlodipine and losartan.  Occasional high readings of blood pressure noted.  Continue to monitor for now.  Acute kidney injury Baseline serum creatinine around 1.4.  Seems to be at baseline.  Monitor urine  output.  Hypothyroidism Continue Synthroid.  Back pain This is a chronic issue.  Low-dose tramadol.  Moderate protein calorie malnutrition Placing the patient at high risk for poor outcome. Continue supplements. Body mass index is 23.16 kg/m.  Nutrition Problem: Moderate Malnutrition Etiology: acute illness (CAP) Interventions: Interventions: Magic cup, MVI, Other (Comment) [employer])   Anemia of acute illness Hemoglobin is low but stable.  No evidence for overt blood loss   Hypernatremia/Hypokalemia/hypomagnesemia Additional doses of potassium to be given today.  Magnesium is better at 1.9.     Goals of care Patient was getting progressively worse during the early part of this hospitalization especially with her worsening delirium.  Family was contacted.   Ongoing discussion with patient's son.  Patient's 1 son was in [place] and is in route to [place] and will be here this afternoon. Palliative care was consulted.  Long discussion with patient's son today.  Considering stability over the last 2 to 3 days it may be reasonable to pursue placement to skilled nursing facility for rehabilitation with palliative care to follow.  If the patient declines then she may transition to hospice at that time.    DVT Prophylaxis: Lovenox CODE STATUS: Full code Family Communication: Son was at bedside  Disposition: To be determined  Status is: Inpatient  Remains inpatient appropriate because:IV treatments appropriate due to intensity of illness or inability to take PO and Inpatient level of care appropriate due to severity of illness  Dispo: The patient is from: Home              Anticipated d/c is to: SNF  Patient currently is not medically stable to d/c.   Difficult to place patient No      Subjective:  Patient mentions back pain is better today compared to yesterday.  Son is at the bedside.  No other complaints offered.   Physical Exam:  General appearance:  Awake alert.  In no distress.  Distracted Resp: Normal effort at rest.  No use of accessory muscles.  Few crackles at the bases.  No wheezing or rhonchi Cardio: S1-S2 is normal regular.  No S3-S4.  No rubs murmurs or bruit GI: Abdomen is soft.  Nontender nondistended.  Bowel sounds are present normal.  No masses organomegaly Extremities: No edema.  Physical deconditioning is noted Neurologic:  No focal neurological deficits.       Vitals:   09/01/20 2015 09/02/20 0528 09/02/20 0756 09/02/20 0930  BP: (!) 147/88 (!) 148/93  (!) 150/90  Pulse: 87 93    Resp: 20 20    Temp: 98.5 F (36.9 C) 97.7 F (36.5 C)    TempSrc: Oral Oral    SpO2: 99% 96% 95%   Weight:      Height:       No intake or output data in the 24 hours ending 09/02/20 1040  Filed Weights   07/27/2020 1157  Weight: 53.8 kg   Data:   CBC: Recent Labs  Lab 08/27/20 0349 08/29/20 1253 08/31/20 0842 09/01/20 0419 09/02/20 0359  WBC 11.5* 19.7* 21.4* 22.3* 25.6*  NEUTROABS  --  17.3* 15.6*  --   --   HGB 9.2* 9.8* 11.0* 9.8* 10.3*  HCT 29.3* 30.9* 33.1* 31.0* 32.9*  MCV 93.3 93.6 89.7 92.5 93.5  PLT 313 324 222 212 109    Basic Metabolic Panel: Recent Labs  Lab 08/30/20 1202 08/30/20 1825 08/31/20 0842 09/01/20 0419 09/02/20 0359  NA 148* 147* 141 145 143  K 2.8* 3.0* 3.2* 2.9* 3.3*  CL 118* 117* 112* 116* 116*  CO2 22 21* 20* 19* 20*  GLUCOSE 131* 180* 134* 98 141*  BUN 36* 34* 30* 31* 31*  CREATININE 0.97 1.07* 0.94 0.92 1.03*  CALCIUM 9.4 9.0 9.1 9.1 9.1  MG  --   --   --  1.0* 1.9    Studies: No results found.   Scheduled Meds:  amLODipine  5 mg Oral Daily   enoxaparin (LOVENOX) injection  30 mg Subcutaneous Daily   feeding supplement (KATE FARMS STANDARD 1.4)  325 mL Oral Daily   guaiFENesin  600 mg Oral BID   levothyroxine  50 mcg Oral Q0600   losartan  50 mg Oral Daily   mometasone-formoterol  2 puff Inhalation BID   multivitamin with minerals  1 tablet Oral Daily    pantoprazole  40 mg Oral Q1200   predniSONE  30 mg Oral Q breakfast   risperiDONE  0.5 mg Oral QHS   Continuous Infusions:  cefTRIAXone (ROCEPHIN)  IV 2 g (09/01/20 1521)   dextrose 5 % and 0.45 % NaCl with KCl 10 mEq/L 50 mL/hr at 09/01/20 1516   metronidazole 500 mg (09/02/20 0507)   potassium chloride 10 mEq (09/02/20 1027)   PRN Meds: acetaminophen, albuterol, haloperidol lactate, hydrALAZINE, ondansetron **OR** ondansetron (ZOFRAN) IV, traMADol    Shaeleigh Graw [place]  [facility] 09/02/2020 10:40 AM  To reach On-call, see care teams to locate the attending and reach out via [email or web address]. Between 7PM-7AM, please contact night-coverage If you still have difficulty reaching the attending provider, please page the [facility] (Director on Call)  for [facility] on amion for assistance.

## 2020-09-02 NOTE — Care Management Important Message (Signed)
Important Message  Patient Details IM Letter given to the Patient. Name: Pamela Mejia MRN: [id number] Date of Birth: 04/07/1926   Medicare Important Message Given:  Yes     Kerin Salen 09/02/2020, 9:53 AM

## 2020-09-03 DIAGNOSIS — R064 Hyperventilation: Secondary | ICD-10-CM

## 2020-09-03 LAB — BASIC METABOLIC PANEL
Anion gap: 9 (ref 5–15)
BUN: 28 mg/dL — ABNORMAL HIGH (ref 8–23)
CO2: 20 mmol/L — ABNORMAL LOW (ref 22–32)
Calcium: 9.1 mg/dL (ref 8.9–10.3)
Chloride: 114 mmol/L — ABNORMAL HIGH (ref 98–111)
Creatinine, Ser: 0.83 mg/dL (ref 0.44–1.00)
GFR, Estimated: >60 mL/min (ref >60)
Glucose, Bld: 106 mg/dL — ABNORMAL HIGH (ref 70–99)
Potassium: 3.8 mmol/L (ref 3.5–5.1)
Sodium: 143 mmol/L (ref 135–145)

## 2020-09-03 MED ORDER — GLYCOPYRROLATE 0.2 MG/ML IJ SOLN
0.2000 mg | INTRAMUSCULAR | Status: DC | PRN
  Administered 2020-09-03 (×2): 0.2 mg via INTRAVENOUS
  Filled 2020-09-03 (×2): qty 1

## 2020-09-03 MED ORDER — POLYVINYL ALCOHOL 1.4 % OP SOLN
1.0000 [drp] | Freq: Four times a day (QID) | OPHTHALMIC | Status: DC | PRN
  Filled 2020-09-03: qty 15

## 2020-09-03 MED ORDER — LORAZEPAM 1 MG PO TABS: 1.0000 mg | ORAL_TABLET | ORAL | Status: DC | PRN

## 2020-09-03 MED ORDER — MORPHINE SULFATE (PF) 2 MG/ML IV SOLN: 1.0000 mg | INTRAVENOUS | Status: DC | PRN

## 2020-09-03 MED ORDER — MORPHINE 100MG IN NS 100ML (1MG/ML) PREMIX INFUSION
2.0000 mg/h | INTRAVENOUS | Status: DC
  Administered 2020-09-03: 2 mg/h via INTRAVENOUS
  Filled 2020-09-03: qty 100

## 2020-09-03 MED ORDER — LORAZEPAM 2 MG/ML PO CONC
1.0000 mg | ORAL | Status: DC | PRN
  Filled 2020-09-03: qty 0.5

## 2020-09-03 MED ORDER — GLYCOPYRROLATE 1 MG PO TABS
1.0000 mg | ORAL_TABLET | ORAL | Status: DC | PRN
  Filled 2020-09-03: qty 1

## 2020-09-03 MED ORDER — MORPHINE SULFATE (PF) 2 MG/ML IV SOLN
2.0000 mg | INTRAVENOUS | Status: DC | PRN
  Administered 2020-09-03 (×2): 2 mg via INTRAVENOUS
  Filled 2020-09-03 (×2): qty 1

## 2020-09-03 MED ORDER — MORPHINE BOLUS VIA INFUSION
2.0000 mg | INTRAVENOUS | Status: DC | PRN
  Administered 2020-09-03: 2 mg via INTRAVENOUS
  Filled 2020-09-03: qty 2

## 2020-09-03 MED ORDER — GLYCOPYRROLATE 0.2 MG/ML IJ SOLN: 0.2000 mg | INTRAMUSCULAR | Status: DC | PRN

## 2020-09-03 MED ORDER — BIOTENE DRY MOUTH MT LIQD
15.0000 mL | Freq: Two times a day (BID) | OROMUCOSAL | Status: DC
  Administered 2020-09-03: 15 mL via TOPICAL

## 2020-09-03 MED ORDER — LORAZEPAM 2 MG/ML IJ SOLN
1.0000 mg | INTRAMUSCULAR | Status: DC | PRN
  Administered 2020-09-03: 1 mg via INTRAVENOUS
  Filled 2020-09-03: qty 1

## 2020-09-03 NOTE — Progress Notes (Signed)
Nutrition Brief Note  Chart reviewed. Patient last assessed by RD on 6/7. Patient now transitioning to comfort care.  No further nutrition interventions planned at this time.  Please re-consult as needed.      Jarome Matin, MS, RD, LDN, CNSC Inpatient Clinical Dietitian RD pager # available in [place]  After hours/weekend pager # available in [facility]

## 2020-09-03 NOTE — Progress Notes (Signed)
Received request from [facility], Randall Hiss for family interest in [facility]. Chart reviewed and eligibility is pending at this time. Met with sons Will and [name] to acknowledge referral and explain services. Unfortunately [facility] does not have a bed to offer today. TOC is aware hospital liaison will follow up tomorrow or sooner if room becomes available.   Please do not hesitate to call with any hospice related questions or concerns.   Thank you for the opportunity to participate in this patient's care.  Jhonnie Garner, Therapist, sports, [facility] Liaison  [phone]

## 2020-09-03 NOTE — Progress Notes (Signed)
I am following up on Chaplain Gerald Stabs Waters's visit with patient's sons from earlier today.  They had still not returned from meeting with hospice and patient was asleep.  We will follow up with patient as we are able, but please also page as needs arise.  [employer], Bcc Pager, [phone] 5:27 PM

## 2020-09-03 NOTE — Progress Notes (Signed)
[place] attempted visit w/pt. and family per [facility] consult for support as pt. is transitioning to hospice/comfort care.  Briefly met two sons Will and [name] as they were about to meet w/hospice representative but sons were not in rm. when [facility] returned at approx 1pm.  [place] will follow up later today if possible.  Lindaann Pascal PRN Chaplain Pager: [phone]

## 2020-09-03 NOTE — TOC Progression Note (Addendum)
Transition of Care [facility]) - Progression Note    Patient Details  Name: Pamela Mejia MRN: [id number] Date of Birth: 16-Jan-1927  Transition of Care [facility]) CM/SW Contact  Ross Ludwig, [place] Phone Number: 09/03/2020, 10:03 AM  Clinical Narrative:     CSW spoke to patient's son and confirmed they would like [facility].  CSW spoke to [place] at [facility] and they will review patient and check on availability.  1:20pm  CSW informed by [facility], patient is being reviewed, and there is no bed available at this time.   Expected Discharge Plan: [place] Barriers to Discharge: Continued Medical Work up  Expected Discharge Plan and Services Expected Discharge Plan: [place] In-house Referral: Clinical Social Work   Post Acute Care Choice: [place] Living arrangements for the past 2 months: [place]                 DME Arranged: N/A DME Agency: NA                   Social Determinants of Health (SDOH) Interventions    Readmission Risk Interventions Readmission Risk Prevention Plan 08/26/2020  Transportation Screening Complete  PCP or Specialist Appt within 5-7 Days Complete  Home Care Screening Complete  Some recent data might be hidden

## 2020-09-03 NOTE — Progress Notes (Signed)
Daily Progress Note   Patient Name: Pamela Mejia       Date: 09/03/2020 DOB: [birth year]  Age: 87 y.o. MRN#: [id number] Attending Physician: Bonnielee Haff, MD Primary Care Physician: Lorene Dy, MD Admit Date: 08/19/2020  Reason for Consultation/Follow-up: Establishing goals of care  Subjective: Patient attempts to speak but is unintelligible  Length of Stay: 10  Current Medications: Scheduled Meds:   antiseptic oral rinse  15 mL Topical BID   levothyroxine  50 mcg Oral Q0600   risperiDONE  0.5 mg Oral QHS    Continuous Infusions:  morphine 2 mg/hr (09/03/20 1413)    PRN Meds: acetaminophen, albuterol, glycopyrrolate **OR** glycopyrrolate **OR** glycopyrrolate, haloperidol lactate, LORazepam **OR** LORazepam **OR** LORazepam, morphine injection, morphine, ondansetron **OR** ondansetron (ZOFRAN) IV, polyvinyl alcohol  Physical Exam Constitutional:      Appearance: She is ill-appearing.     Comments: lethargic  Pulmonary:     Comments: Slight increased RR Skin:    General: Skin is warm and dry.            Vital Signs: BP (!) 161/92 (BP Location: Left Arm)   Pulse 88   Temp 98.4 F (36.9 C) (Oral)   Resp 14   Ht 5' (1.524 m)   Wt 53.8 kg   SpO2 96%   BMI 23.16 kg/m  SpO2: SpO2: 96 % O2 Device: O2 Device: Nasal Cannula O2 Flow Rate: O2 Flow Rate (L/min): 1 L/min  Intake/output summary:  Intake/Output Summary (Last 24 hours) at 09/03/2020 1633 Last data filed at 09/03/2020 0938 Gross per 24 hour  Intake 1922.89 ml  Output 675 ml  Net 1247.89 ml   LBM: Last BM Date: 09/03/20 Baseline Weight: Weight: 53.8 kg Most recent weight: Weight: 53.8 kg       Palliative Assessment/Data: PPS 10%    Flowsheet Rows    Flowsheet Row Most Recent Value  Intake Tab   Referral  Department Hospitalist  Unit at Time of Referral Intermediate Care Unit  Palliative Care Primary Diagnosis Sepsis/Infectious Disease  Date Notified 08/31/20  Palliative Care Type New Palliative care  Reason for referral Clarify Goals of Care  Date of Admission 08/07/2020  Date first seen by Palliative Care 09/02/20  # of days Palliative referral response time 2 Day(s)  # of days IP prior to Palliative referral 7  Clinical Assessment   Psychosocial & Spiritual Assessment   Palliative Care Outcomes        Patient Active Problem List   Diagnosis Date Noted   Malnutrition of moderate degree 08/27/2020   Pneumonia 08/12/2020   CAP (community acquired pneumonia) 07/29/2020   AKI (acute kidney injury) ([place]) 07/29/2020   History of diverticulitis 09/23/2015   Asthma, moderate persistent 02/03/2015   Hypothyroidism 02/03/2015   Essential hypertension 02/03/2015   Peritonitis ([place]) 02/03/2015    Palliative Care Assessment & Plan   HPI: 87 y.o. female  with past medical history of  HTN, hypothyroidism, and HLD admitted on 07/29/2020 with cough and shortness of breath. She has had this issue for over a month and sought out different treatments with a recent worsening in symptoms. She has been treated for CAP and acute asthma exacerbation. Also with significant AMS/agitation  throughout hospitalization.  Poor PO intake has continued throughout hospitalization. PMT consulted to discuss [place].  Assessment: Received call this AM from son Will. He tells me patient appears much worse this am and he and his brother they spoken - they would like to proceed with comfort measures only and transition to hospice facility. Transition was made.  Checked on patient later in the day and noted increased RR/slightly labored breathing. Discussed initiated continuous morphine infusions with her sons who agree. They confirm they are interested in any measure to ensure her comfort.   TOC made aware of desire for  hospice facility transfer - family specifically request [facility].   Recommendations/Plan: Comfort measures only, all orders not needed for comfort dc'd Initiate morphine infusion PRNs available for agitation, anxiety, nausea, increased secretions Transfer to hospice facility when bed available  Goals of Care and Additional Recommendations: Limitations on Scope of Treatment: Full Comfort Care  Code Status: DNR  Prognosis:  < 2 weeks  Discharge Planning: Hospice facility  Care plan was discussed with patient's son Will, RN, and TOC  Thank you for allowing the Palliative Medicine Team to assist in the care of this patient.   Total Time 30 minutes Prolonged Time Billed  no       Greater than 50%  of this time was spent counseling and coordinating care related to the above assessment and plan.  Juel Burrow, DNP, [facility] Palliative Medicine Team Team Phone # [phone]  Pager [phone]

## 2020-09-03 NOTE — Progress Notes (Signed)
[facility] Progress Note   Patient: Pamela Mejia    TDH:[id number]  DOA: 08/14/2020      Brief hospital course: PMH  HTN, hypothyroidism, HLD. Presented with shortness of breath and found to have asthma exacerbation and community-acquired pneumonia. 5/31 admitted for right lower lobe pneumonia 6/3 worsening respiratory distress overnight requiring nonrebreather 6/4-6/7 Remains confused and agitated with poor p.o. intake and unable to sleep at night. Mentation appears to be improving.  Oral intake remains poor though better than before.   Assessment and Plan:  Community-acquired pneumonia/acute asthma exacerbation Patient has been dealing with this since May 4. Has completed 2 courses of antibiotics. Chest x-ray shows multifocal pneumonia which is worsening although appears to be about the same clinically. COVID test is negative. Influenza negative as well.  Treated with IV steroids.  Subsequently transitioned to oral steroids.   Respiratory status has been stable though tenuous. Noted to be more dyspneic today.  Remains on oxygen by nasal cannula at 1 to 2 L.  She was also on antibacterials with plan to give her a 7-day course. Patient was seen by speech therapy and is on a dysphagia 1 diet. Leukocytosis is most likely due to steroids.  She is afebrile. Patient has now been transitioned to comfort care.  Acute metabolic encephalopathy/Sleep deprivation and agitation. Patient had progressively worsening mentation.  Steroid dose was decreased.   Mentation has improved but noted to be more lethargic today.   Did not tolerate Seroquel but seems to be better on Risperdal on on a nightly basis.    Essential hypertension Continue amlodipine and losartan.  Occasional high readings of blood pressure noted.  Continue to monitor for now.  Acute kidney injury Baseline serum creatinine around 1.4.  Seems to be at baseline.  Monitor urine output.  Hypothyroidism Continue  Synthroid.  Back pain This is a chronic issue.  Low-dose tramadol.  Moderate protein calorie malnutrition Placing the patient at high risk for poor outcome. Continue supplements. Body mass index is 23.16 kg/m.  Nutrition Problem: Moderate Malnutrition Etiology: acute illness (CAP) Interventions: Interventions: Magic cup, MVI, Other (Comment) [employer])   Anemia of acute illness Hemoglobin is low but stable.  No evidence for overt blood loss   Hypernatremia/Hypokalemia/hypomagnesemia Levels are stable.  Goals of care Patient was getting progressively worse during the early part of this hospitalization especially with her worsening delirium.  Family was contacted.   Discussions were held with patient's son who was available locally.  Other son was in [place] and he arrived back to the [place] on 6/9.  Perative care met with both sons yesterday.  Plan was to see how she does before deciding further course of action regarding going to SNF for rehab versus hospice.  Patient clearly looks worse this morning.  Discussed with palliative care as well as patient's son.  It is felt that patient would benefit from transitioning to comfort care and hospice due to her clinically worsening situation.  Everybody is in agreement.  DVT Prophylaxis: Lovenox CODE STATUS: DNR/comfort care Family Communication: Son was at bedside  Disposition: Residential hospice  Status is: Inpatient  Remains inpatient appropriate because:IV treatments appropriate due to intensity of illness or inability to take PO and Inpatient level of care appropriate due to severity of illness  Dispo: The patient is from: Home              Anticipated d/c is to: Residential hospice  Patient currently is not medically stable to d/c.   Difficult to place patient No      Subjective:  Patient appears to be more short of breath today.  More lethargic.  Son is at the bedside.     Physical Exam:  General appearance:  More lethargic today Resp: Noted to be tachypneic with some use of accessory muscles.  Crackles bilateral bases. Cardio: S1-S2 is normal regular.  No S3-S4.  No rubs murmurs or bruit GI: Abdomen is soft.  Nontender nondistended.  Bowel sounds are present normal.  No masses organomegaly Extremities: No edema.   Neurologic:  No focal neurological deficits.        Vitals:   09/02/20 2134 09/02/20 2153 09/03/20 0523 09/03/20 0746  BP: (!) 157/86  (!) 161/92   Pulse: 95  88   Resp: 20  14   Temp: 98.1 F (36.7 C)  98.4 F (36.9 C)   TempSrc: Oral  Oral   SpO2: 95% 95% 95% 96%  Weight:      Height:        Intake/Output Summary (Last 24 hours) at 09/03/2020 1335 Last data filed at 09/03/2020 0917 Gross per 24 hour  Intake 1922.89 ml  Output 675 ml  Net 1247.89 ml    Filed Weights   08/15/2020 1157  Weight: 53.8 kg   Data:   CBC: Recent Labs  Lab 08/29/20 1253 08/31/20 0842 09/01/20 0419 09/02/20 0359  WBC 19.7* 21.4* 22.3* 25.6*  NEUTROABS 17.3* 15.6*  --   --   HGB 9.8* 11.0* 9.8* 10.3*  HCT 30.9* 33.1* 31.0* 32.9*  MCV 93.6 89.7 92.5 93.5  PLT 324 222 212 329    Basic Metabolic Panel: Recent Labs  Lab 08/30/20 1825 08/31/20 0842 09/01/20 0419 09/02/20 0359 09/03/20 0402  NA 147* 141 145 143 143  K 3.0* 3.2* 2.9* 3.3* 3.8  CL 117* 112* 116* 116* 114*  CO2 21* 20* 19* 20* 20*  GLUCOSE 180* 134* 98 141* 106*  BUN 34* 30* 31* 31* 28*  CREATININE 1.07* 0.94 0.92 1.03* 0.83  CALCIUM 9.0 9.1 9.1 9.1 9.1  MG  --   --  1.0* 1.9  --     Studies: No results found.   Scheduled Meds:  antiseptic oral rinse  15 mL Topical BID   levothyroxine  50 mcg Oral Q0600   risperiDONE  0.5 mg Oral QHS   Continuous Infusions:   PRN Meds: acetaminophen, albuterol, glycopyrrolate **OR** glycopyrrolate **OR** glycopyrrolate, haloperidol lactate, LORazepam **OR** LORazepam **OR** LORazepam, morphine injection, ondansetron **OR** ondansetron (ZOFRAN) IV, polyvinyl  alcohol    Bellamie Turney [place]  [facility] 09/03/2020 1:35 PM  To reach On-call, see care teams to locate the attending and reach out via [email or web address]. Between 7PM-7AM, please contact night-coverage If you still have difficulty reaching the attending provider, please page the [facility] (Director on Call) for [facility] on amion for assistance.

## 2020-09-04 DIAGNOSIS — R531 Weakness: Secondary | ICD-10-CM

## 2020-09-24 NOTE — Death Summary Note (Signed)
DEATH SUMMARY   Patient Details  Name: Kaedyn Belardo MRN: [id number] DOB: 03/13/1927  Admission/Discharge Information   Admit Date:  September 04, 2020  Date of Death: Date of Death: September 15, 2020  Time of Death: Time of Death: 06-24-2219  Length of Stay: June 17, 2022  Referring Physician: Lorene Dy, MD   Reason(s) for Hospitalization  Community-acquired pneumonia  Diagnoses  Preliminary cause of death: CAP (community acquired pneumonia) Secondary Diagnoses (including complications and co-morbidities):  Asthma Essential hypertension Hypothyroidism Moderate protein calorie malnutrition   Brief Hospital Course (including significant findings, care, treatment, and services provided and events leading to death)    Brief HPI PMH  HTN, hypothyroidism, HLD. Presented with shortness of breath and found to have asthma exacerbation and community-acquired pneumonia.     Hospital Course:   Community-acquired pneumonia/acute asthma exacerbation Patient has been dealing with this since May 4. Has completed 2 courses of antibiotics. Chest x-ray shows multifocal pneumonia which is worsening. Patient was started on antibacterials. COVID test was negative. Influenza negative as well. Treated with IV steroids.  Subsequently transitioned to oral steroids.  Patient was seen by speech therapy and was on a dysphagia 1 diet. Patient subsequently continued to worsen with progressively worsening shortness of breath.  After discussions with her 2 sons she was transitioned over to comfort care.    Acute metabolic encephalopathy/Sleep deprivation and agitation. This was secondary to acute illness as well as steroids. Did not tolerate Seroquel but did better with Risperdal on on a nightly basis.     Essential hypertension Acute kidney injury Hypothyroidism Back pain   Moderate protein calorie malnutrition Placing the patient at high risk for poor outcome. Continue supplements. Body mass index is 23.16 kg/m.   Nutrition Problem: Moderate Malnutrition Etiology: acute illness (CAP) Interventions: Interventions: Magic cup, MVI, Other (Comment) [employer])    Anemia of acute illness   Patient expired on Sep 15, 2020 at 10:21 PM.   Pertinent Labs and Studies  Significant Diagnostic Studies DG CHEST PORT 1 VIEW  Result Date: 08/31/2020 CLINICAL DATA:  Shortness of breath EXAM: PORTABLE CHEST 1 VIEW COMPARISON:  Portable exam 1300 hours compared to 08/29/2020 FINDINGS: Enlargement of cardiac silhouette. Large hiatal hernia. Mediastinal contours and pulmonary vascularity otherwise normal. LEFT pleural effusion and increased atelectasis versus consolidation in lower LEFT lung. Mild infiltrate at RIGHT base persists. Upper lungs clear. Calcified granuloma LEFT upper lobe stable. No pneumothorax. Bones demineralized. IMPRESSION: LEFT pleural effusion with increased atelectasis versus consolidation LEFT lower lobe. Persistent RIGHT basilar infiltrate. Large hiatal hernia. Aortic Atherosclerosis (ICD10-I70.0). Electronically Signed   By: Lavonia Dana M.D.   On: 08/31/2020 14:48   DG CHEST PORT 1 VIEW  Result Date: 08/29/2020 CLINICAL DATA:  Shortness of breath EXAM: PORTABLE CHEST 1 VIEW COMPARISON:  August 27, 2020 FINDINGS: There has been partial clearing of airspace opacity from the left lower lobe. Ill-defined opacity in the right lower lobe persists. Heart is upper normal in size with pulmonary vascular normal. There is a large hiatal type hernia. There is aortic atherosclerosis. No adenopathy. No bone lesions. IMPRESSION: Persistent right lower lobe airspace opacity consistent with pneumonia. Partial clearing of opacity from the left base. No new opacity evident. Sizable hiatal type hernia. Stable cardiac silhouette. Aortic Atherosclerosis (ICD10-I70.0). Electronically Signed   By: Lowella Grip III M.D.   On: 08/29/2020 09:09   DG CHEST PORT 1 VIEW  Result Date: 08/27/2020 CLINICAL DATA:  Increasing shortness  of breath EXAM: PORTABLE CHEST 1 VIEW COMPARISON:  09/23/2020 FINDINGS: Single frontal view  of the chest demonstrates increasing right basilar airspace disease, with new consolidation in the right upper and left lower lobes. Small left pleural effusion new since prior study. No pneumothorax. No acute bony abnormalities. Stable hiatal hernia. IMPRESSION: 1. Increasing bilateral airspace disease consistent with multifocal pneumonia. 2. Small left pleural effusion, new since prior study. Electronically Signed   By: Randa Ngo M.D.   On: 08/27/2020 02:42   DG Chest Port 1 View  Result Date: 08/04/2020 CLINICAL DATA:  Shortness of breath EXAM: PORTABLE CHEST 1 VIEW COMPARISON:  Chest radiograph and chest CT Jul 28, 2020 FINDINGS: Airspace opacity is noted in the right base region. Lungs elsewhere are clear. Heart is mildly enlarged with pulmonary vascularity normal. There is a sizable hiatal type hernia. No adenopathy. There is aortic atherosclerosis. No bone lesions. IMPRESSION: Airspace opacity concerning for pneumonia right base. No similar changes elsewhere. Mild cardiac enlargement is stable. Sizable hiatal hernia noted. Aortic Atherosclerosis (ICD10-I70.0). Electronically Signed   By: Lowella Grip III M.D.   On: 07/30/2020 12:45    Microbiology No results found for this or any previous visit (from the past 240 hour(s)).  Lab Basic Metabolic Panel: Recent Labs  Lab 09/01/20 0419 09/02/20 0359 09/03/20 0402  NA 145 143 143  K 2.9* 3.3* 3.8  CL 116* 116* 114*  CO2 19* 20* 20*  GLUCOSE 98 141* 106*  BUN 31* 31* 28*  CREATININE 0.92 1.03* 0.83  CALCIUM 9.1 9.1 9.1  MG 1.0* 1.9  --     CBC: Recent Labs  Lab 09/01/20 0419 09/02/20 0359  WBC 22.3* 25.6*  HGB 9.8* 10.3*  HCT 31.0* 32.9*  MCV 92.5 93.5  PLT 212 184    Sepsis Labs: Recent Labs  Lab 09/01/20 0419 09/02/20 0359  WBC 22.3* 25.6*     Dontario Evetts 09/07/2020, 11:48 AM

## 2020-09-24 NOTE — Progress Notes (Signed)
Daily Progress Note   Patient Name: Pamela Mejia       Date: 30-Sep-2020 DOB: November 12, 1926  Age: 87 y.o. MRN#: [id number] Attending Physician: Bonnielee Haff, MD Primary Care Physician: Lorene Dy, MD Admit Date: 08/07/2020  Reason for Consultation/Follow-up: Establishing goals of care  Subjective: Patient is unresponsive, has shallow breathing, some apneic pauses  Length of Stay: 11  Current Medications: Scheduled Meds:   antiseptic oral rinse  15 mL Topical BID   levothyroxine  50 mcg Oral Q0600   risperiDONE  0.5 mg Oral QHS    Continuous Infusions:  morphine 2 mg/hr (09/03/20 1413)    PRN Meds: acetaminophen, albuterol, glycopyrrolate **OR** glycopyrrolate **OR** glycopyrrolate, haloperidol lactate, LORazepam **OR** LORazepam **OR** LORazepam, morphine injection, morphine, ondansetron **OR** ondansetron (ZOFRAN) IV, polyvinyl alcohol  Physical Exam Constitutional:      Appearance: She is ill-appearing.     Comments: lethargic  Pulmonary:     Comments: Slight increased RR Skin:    General: Skin is warm and dry.            Vital Signs: BP 108/67 (BP Location: Left Arm)   Pulse 89   Temp 97.8 F (36.6 C) (Oral)   Resp 11   Ht 5' (1.524 m)   Wt 53.8 kg   SpO2 97%   BMI 23.16 kg/m  SpO2: SpO2: 97 % O2 Device: O2 Device: Nasal Cannula O2 Flow Rate: O2 Flow Rate (L/min): 1 L/min  Intake/output summary:  Intake/Output Summary (Last 24 hours) at 30-Sep-2020 0817 Last data filed at 09/03/2020 2300 Gross per 24 hour  Intake 84 ml  Output 550 ml  Net -466 ml    LBM: Last BM Date: 09/03/20 Baseline Weight: Weight: 53.8 kg Most recent weight: Weight: 53.8 kg       Palliative Assessment/Data: PPS 10%    Flowsheet Rows    Flowsheet Row Most Recent Value  Intake Tab    Referral Department Hospitalist  Unit at Time of Referral Intermediate Care Unit  Palliative Care Primary Diagnosis Sepsis/Infectious Disease  Date Notified 08/31/20  Palliative Care Type New Palliative care  Reason for referral Clarify Goals of Care  Date of Admission 07/29/2020  Date first seen by Palliative Care 09/02/20  # of days Palliative referral response time 2 Day(s)  # of days IP prior to Palliative referral 7  Clinical Assessment   Psychosocial & Spiritual Assessment   Palliative Care Outcomes        Patient Active Problem List   Diagnosis Date Noted   Malnutrition of moderate degree 08/27/2020   Pneumonia 08/06/2020   CAP (community acquired pneumonia) 07/29/2020   AKI (acute kidney injury) ([place]) 07/29/2020   History of diverticulitis 09/23/2015   Asthma, moderate persistent 02/03/2015   Hypothyroidism 02/03/2015   Essential hypertension 02/03/2015   Peritonitis ([place]) 02/03/2015    Palliative Care Assessment & Plan   HPI: 87 y.o. female  with past medical history of  HTN, hypothyroidism, and HLD admitted on 07/29/2020 with cough and shortness of breath. She has had this issue for over a month and sought out different treatments with a recent worsening in symptoms. She has been treated for CAP and acute asthma exacerbation. Also with  significant AMS/agitation throughout hospitalization.  Poor PO intake has continued throughout hospitalization. PMT consulted to discuss [place].  Assessment: Received call this AM from son Pamela Mejia. He tells me patient appears much worse this am and he and his brother they spoken - they would like to proceed with comfort measures only and transition to hospice facility. Transition was made.  Checked on patient later in the day and noted increased RR/slightly labored breathing. Discussed initiated continuous morphine infusions with her sons who agree. They confirm they are interested in any measure to ensure her comfort.   TOC made aware of  desire for hospice facility transfer - family specifically request [facility].   Recommendations/Plan: Comfort measures only, all orders not needed for comfort dc'd Initiate morphine infusion: Patient appears to be comfortable on 2 mg of morphine drip an hour, patient seen and examined.  Does not appear to have any wincing or grimacing. PRNs available for agitation, anxiety, nausea, increased secretions Transfer to hospice facility when bed available: Attempt to reach out to hospice liaison.  Goals of Care and Additional Recommendations: Limitations on Scope of Treatment: Full Comfort Care  Code Status: DNR  Prognosis:  < 2 weeks  Discharge Planning: Hospice facility  Care plan was discussed with patient's son Pamela Mejia via phone this morning Thank you for allowing the Palliative Medicine Team to assist in the care of this patient.   Total Time 25 minutes Prolonged Time Billed  no       Greater than 50%  of this time was spent counseling and coordinating care related to the above assessment and plan. Loistine Chance, MD Palliative Medicine Team Team Phone # [phone]

## 2020-09-24 NOTE — Progress Notes (Signed)
Manufacturing engineer [facility]) Hospital Liaison note.     Patient information has been reviewed and  eligibility confirmed.  Unfortunately [facility] does not have a room available today.   [facility] will follow up tomorrow or sooner if a room becomes available.   A Please do not hesitate to call with questions.     Thank you,    Farrel Gordon, RN, [place] (listed on [facility] under [place])     [phone]

## 2020-09-24 NOTE — Progress Notes (Signed)
[facility] Progress Note   Patient: Pamela Mejia    FXT:[id number]  DOA: 07/25/2020      Brief hospital course: PMH  HTN, hypothyroidism, HLD. Presented with shortness of breath and found to have asthma exacerbation and community-acquired pneumonia. 5/31 admitted for right lower lobe pneumonia 6/3 worsening respiratory distress overnight requiring nonrebreather 6/4-6/7 Remains confused and agitated with poor p.o. intake and unable to sleep at night. Mentation appears to be improving.  Oral intake remains poor though better than before.   Assessment and Plan:  Community-acquired pneumonia/acute asthma exacerbation Patient has been dealing with this since May 4. Has completed 2 courses of antibiotics. Chest x-ray shows multifocal pneumonia which is worsening. Patient was started on antibacterials. COVID test was negative. Influenza negative as well.  Treated with IV steroids.  Subsequently transitioned to oral steroids.   Patient was seen by speech therapy and was on a dysphagia 1 diet. Leukocytosis is most likely due to steroids.  Patient subsequently continued to worsen with progressively worsening shortness of breath.  After discussions with her 2 sons she was transitioned over to comfort care.  Remains on a morphine infusion.  Acute metabolic encephalopathy/Sleep deprivation and agitation. This was secondary to acute illness as well as steroids.     Did not tolerate Seroquel but did better with Risperdal on on a nightly basis.    Essential hypertension Antihypertensives stopped as she is now comfort care.  Acute kidney injury Baseline serum creatinine around 1.4.    Hypothyroidism   Back pain This is a chronic issue.    Moderate protein calorie malnutrition Placing the patient at high risk for poor outcome. Continue supplements. Body mass index is 23.16 kg/m.  Nutrition Problem: Moderate Malnutrition Etiology: acute illness (CAP) Interventions: Interventions:  Magic cup, MVI, Other (Comment) [employer])   Anemia of acute illness  Hypernatremia/Hypokalemia/hypomagnesemia   Goals of care Patient was getting progressively worse during the early part of this hospitalization especially with her worsening delirium.  Family was contacted.   Discussions were held with patient's son who was available locally.  Other son was in [place] and he arrived back to the [place] on 6/9.  Perative care met with both sons yesterday.  Plan was to see how she does before deciding further course of action regarding going to SNF for rehab versus hospice.   Due to continued worsening she was transitioned to comfort care.  Anticipate in-hospital death based on how she is looking this morning.  Hospice liaison is also following.  DVT Prophylaxis: Lovenox CODE STATUS: DNR/comfort care Family Communication: No family at bedside currently Disposition: Residential hospice versus in-hospital death  Status is: Inpatient  Remains inpatient appropriate because:IV treatments appropriate due to intensity of illness or inability to take PO and Inpatient level of care appropriate due to severity of illness  Dispo: The patient is from: Home              Anticipated d/c is to: Residential hospice              Patient currently is not medically stable to d/c.   Difficult to place patient No      Subjective:  Patient is unresponsive   Physical Exam:  Patient not very responsive.  Appears to be comfortable.  Does not appear to be in any pain. Shallow respirations noted.       Vitals:   09/03/20 0523 09/03/20 0746 09/03/20 1657 09/18/2020 0458  BP: (!) 161/92  102/78 108/67  Pulse:  88  96 89  Resp: _0 Temp: 98.4 F (36.9 C)  98.1 F (36.7 C) 97.8 F (36.6 C)  TempSrc: Oral  Oral Oral  SpO2: 95% 96% 99% 97%  Weight:      Height:        Intake/Output Summary (Last 24 hours) at 18-Sep-2020 1013 Last data filed at 09/03/2020 2300 Gross per 24 hour  Intake 24 ml   Output 550 ml  Net -526 ml    Filed Weights   07/27/2020 1157  Weight: 53.8 kg   Data:   CBC: Recent Labs  Lab 08/29/20 1253 08/31/20 0842 09/01/20 0419 09/02/20 0359  WBC 19.7* 21.4* 22.3* 25.6*  NEUTROABS 17.3* 15.6*  --   --   HGB 9.8* 11.0* 9.8* 10.3*  HCT 30.9* 33.1* 31.0* 32.9*  MCV 93.6 89.7 92.5 93.5  PLT 324 222 212 820    Basic Metabolic Panel: Recent Labs  Lab 08/30/20 1825 08/31/20 0842 09/01/20 0419 09/02/20 0359 09/03/20 0402  NA 147* 141 145 143 143  K 3.0* 3.2* 2.9* 3.3* 3.8  CL 117* 112* 116* 116* 114*  CO2 21* 20* 19* 20* 20*  GLUCOSE 180* 134* 98 141* 106*  BUN 34* 30* 31* 31* 28*  CREATININE 1.07* 0.94 0.92 1.03* 0.83  CALCIUM 9.0 9.1 9.1 9.1 9.1  MG  --   --  1.0* 1.9  --     Studies: No results found.   Scheduled Meds:  antiseptic oral rinse  15 mL Topical BID   levothyroxine  50 mcg Oral Q0600   risperiDONE  0.5 mg Oral QHS   Continuous Infusions:  morphine 2 mg/hr (09/03/20 1413)    PRN Meds: acetaminophen, albuterol, glycopyrrolate **OR** glycopyrrolate **OR** glycopyrrolate, haloperidol lactate, LORazepam **OR** LORazepam **OR** LORazepam, morphine injection, morphine, ondansetron **OR** ondansetron (ZOFRAN) IV, polyvinyl alcohol    Abdinasir Spadafore [place]  [facility] 18-Sep-2020 10:13 AM  To reach On-call, see care teams to locate the attending and reach out via [email or web address]. Between 7PM-7AM, please contact night-coverage If you still have difficulty reaching the attending provider, please page the [facility] (Director on Call) for [facility] on amion for assistance.

## 2020-09-24 DEATH — deceased
